# Patient Record
Sex: Female | Born: 1975 | Race: Black or African American | Hispanic: No | Marital: Single | Smoking: Never smoker
Health system: Southern US, Community
[De-identification: ages and names within clinical notes are randomized; demographics above are authoritative.]

## PROBLEM LIST (undated history)

## (undated) DIAGNOSIS — F329 Major depressive disorder, single episode, unspecified: Secondary | ICD-10-CM

## (undated) DIAGNOSIS — G8929 Other chronic pain: Secondary | ICD-10-CM

## (undated) DIAGNOSIS — E669 Obesity, unspecified: Secondary | ICD-10-CM

## (undated) DIAGNOSIS — F909 Attention-deficit hyperactivity disorder, unspecified type: Secondary | ICD-10-CM

## (undated) DIAGNOSIS — F419 Anxiety disorder, unspecified: Secondary | ICD-10-CM

## (undated) DIAGNOSIS — F32A Depression, unspecified: Secondary | ICD-10-CM

## (undated) DIAGNOSIS — G43909 Migraine, unspecified, not intractable, without status migrainosus: Secondary | ICD-10-CM

## (undated) DIAGNOSIS — F319 Bipolar disorder, unspecified: Secondary | ICD-10-CM

## (undated) DIAGNOSIS — S82843A Displaced bimalleolar fracture of unspecified lower leg, initial encounter for closed fracture: Secondary | ICD-10-CM

## (undated) DIAGNOSIS — M2550 Pain in unspecified joint: Secondary | ICD-10-CM

## (undated) DIAGNOSIS — R7303 Prediabetes: Secondary | ICD-10-CM

## (undated) DIAGNOSIS — D649 Anemia, unspecified: Secondary | ICD-10-CM

## (undated) HISTORY — DX: Depression, unspecified: F32.A

## (undated) HISTORY — DX: Obesity, unspecified: E66.9

## (undated) HISTORY — PX: WISDOM TOOTH EXTRACTION: SHX21

## (undated) HISTORY — DX: Migraine, unspecified, not intractable, without status migrainosus: G43.909

## (undated) HISTORY — DX: Major depressive disorder, single episode, unspecified: F32.9

## (undated) HISTORY — DX: Anemia, unspecified: D64.9

---

## 1997-10-29 ENCOUNTER — Emergency Department (HOSPITAL_COMMUNITY): Admission: EM | Admit: 1997-10-29 | Discharge: 1997-10-29 | Payer: Self-pay | Admitting: Emergency Medicine

## 1997-10-31 ENCOUNTER — Emergency Department (HOSPITAL_COMMUNITY): Admission: EM | Admit: 1997-10-31 | Discharge: 1997-10-31 | Payer: Self-pay | Admitting: Emergency Medicine

## 1998-07-25 ENCOUNTER — Other Ambulatory Visit: Admission: RE | Admit: 1998-07-25 | Discharge: 1998-07-25 | Payer: Self-pay | Admitting: Obstetrics

## 1998-07-25 ENCOUNTER — Ambulatory Visit (HOSPITAL_COMMUNITY): Admission: AD | Admit: 1998-07-25 | Discharge: 1998-07-25 | Payer: Self-pay | Admitting: Obstetrics

## 1999-02-13 ENCOUNTER — Emergency Department (HOSPITAL_COMMUNITY): Admission: EM | Admit: 1999-02-13 | Discharge: 1999-02-13 | Payer: Self-pay | Admitting: Emergency Medicine

## 1999-10-10 ENCOUNTER — Encounter: Payer: Self-pay | Admitting: Emergency Medicine

## 1999-10-10 ENCOUNTER — Emergency Department (HOSPITAL_COMMUNITY): Admission: EM | Admit: 1999-10-10 | Discharge: 1999-10-10 | Payer: Self-pay | Admitting: Emergency Medicine

## 1999-10-25 ENCOUNTER — Emergency Department (HOSPITAL_COMMUNITY): Admission: EM | Admit: 1999-10-25 | Discharge: 1999-10-25 | Payer: Self-pay

## 2000-03-05 ENCOUNTER — Other Ambulatory Visit: Admission: RE | Admit: 2000-03-05 | Discharge: 2000-03-05 | Payer: Self-pay | Admitting: Obstetrics

## 2000-05-10 ENCOUNTER — Emergency Department (HOSPITAL_COMMUNITY): Admission: EM | Admit: 2000-05-10 | Discharge: 2000-05-10 | Payer: Self-pay | Admitting: Emergency Medicine

## 2000-05-24 ENCOUNTER — Inpatient Hospital Stay (HOSPITAL_COMMUNITY): Admission: AD | Admit: 2000-05-24 | Discharge: 2000-05-24 | Payer: Self-pay | Admitting: Obstetrics

## 2000-06-08 ENCOUNTER — Encounter: Payer: Self-pay | Admitting: Emergency Medicine

## 2000-06-08 ENCOUNTER — Emergency Department (HOSPITAL_COMMUNITY): Admission: EM | Admit: 2000-06-08 | Discharge: 2000-06-09 | Payer: Self-pay | Admitting: Emergency Medicine

## 2000-07-01 ENCOUNTER — Emergency Department (HOSPITAL_COMMUNITY): Admission: EM | Admit: 2000-07-01 | Discharge: 2000-07-01 | Payer: Self-pay

## 2000-07-31 ENCOUNTER — Inpatient Hospital Stay (HOSPITAL_COMMUNITY): Admission: AD | Admit: 2000-07-31 | Discharge: 2000-07-31 | Payer: Self-pay | Admitting: Obstetrics

## 2000-09-01 ENCOUNTER — Inpatient Hospital Stay (HOSPITAL_COMMUNITY): Admission: AD | Admit: 2000-09-01 | Discharge: 2000-09-01 | Payer: Self-pay | Admitting: Obstetrics

## 2000-09-15 ENCOUNTER — Inpatient Hospital Stay (HOSPITAL_COMMUNITY): Admission: AD | Admit: 2000-09-15 | Discharge: 2000-09-18 | Payer: Self-pay | Admitting: Obstetrics

## 2000-09-20 ENCOUNTER — Encounter: Payer: Self-pay | Admitting: Emergency Medicine

## 2000-09-20 ENCOUNTER — Emergency Department (HOSPITAL_COMMUNITY): Admission: EM | Admit: 2000-09-20 | Discharge: 2000-09-20 | Payer: Self-pay | Admitting: Emergency Medicine

## 2001-10-08 ENCOUNTER — Emergency Department (HOSPITAL_COMMUNITY): Admission: EM | Admit: 2001-10-08 | Discharge: 2001-10-08 | Payer: Self-pay | Admitting: Emergency Medicine

## 2001-10-12 ENCOUNTER — Emergency Department (HOSPITAL_COMMUNITY): Admission: EM | Admit: 2001-10-12 | Discharge: 2001-10-12 | Payer: Self-pay

## 2001-12-03 ENCOUNTER — Emergency Department (HOSPITAL_COMMUNITY): Admission: EM | Admit: 2001-12-03 | Discharge: 2001-12-03 | Payer: Self-pay | Admitting: Emergency Medicine

## 2001-12-03 ENCOUNTER — Encounter: Payer: Self-pay | Admitting: Emergency Medicine

## 2002-02-06 ENCOUNTER — Emergency Department (HOSPITAL_COMMUNITY): Admission: EM | Admit: 2002-02-06 | Discharge: 2002-02-06 | Payer: Self-pay | Admitting: Emergency Medicine

## 2002-05-11 ENCOUNTER — Emergency Department (HOSPITAL_COMMUNITY): Admission: EM | Admit: 2002-05-11 | Discharge: 2002-05-11 | Payer: Self-pay | Admitting: Emergency Medicine

## 2002-05-11 ENCOUNTER — Encounter: Payer: Self-pay | Admitting: Emergency Medicine

## 2002-05-14 ENCOUNTER — Encounter: Payer: Self-pay | Admitting: Emergency Medicine

## 2002-05-14 ENCOUNTER — Emergency Department (HOSPITAL_COMMUNITY): Admission: EM | Admit: 2002-05-14 | Discharge: 2002-05-14 | Payer: Self-pay | Admitting: Emergency Medicine

## 2002-12-14 ENCOUNTER — Encounter: Payer: Self-pay | Admitting: Obstetrics

## 2002-12-14 ENCOUNTER — Inpatient Hospital Stay (HOSPITAL_COMMUNITY): Admission: AD | Admit: 2002-12-14 | Discharge: 2002-12-14 | Payer: Self-pay | Admitting: Obstetrics

## 2003-02-11 ENCOUNTER — Emergency Department (HOSPITAL_COMMUNITY): Admission: EM | Admit: 2003-02-11 | Discharge: 2003-02-11 | Payer: Self-pay | Admitting: Emergency Medicine

## 2003-06-28 ENCOUNTER — Emergency Department (HOSPITAL_COMMUNITY): Admission: EM | Admit: 2003-06-28 | Discharge: 2003-06-28 | Payer: Self-pay | Admitting: Emergency Medicine

## 2003-07-10 ENCOUNTER — Inpatient Hospital Stay (HOSPITAL_COMMUNITY): Admission: AD | Admit: 2003-07-10 | Discharge: 2003-07-13 | Payer: Self-pay | Admitting: Obstetrics

## 2003-07-12 ENCOUNTER — Encounter (INDEPENDENT_AMBULATORY_CARE_PROVIDER_SITE_OTHER): Payer: Self-pay | Admitting: Specialist

## 2003-07-12 HISTORY — PX: TUBAL LIGATION: SHX77

## 2003-11-04 ENCOUNTER — Emergency Department (HOSPITAL_COMMUNITY): Admission: EM | Admit: 2003-11-04 | Discharge: 2003-11-04 | Payer: Self-pay | Admitting: Emergency Medicine

## 2003-11-08 ENCOUNTER — Emergency Department (HOSPITAL_COMMUNITY): Admission: EM | Admit: 2003-11-08 | Discharge: 2003-11-08 | Payer: Self-pay | Admitting: Emergency Medicine

## 2005-10-04 ENCOUNTER — Emergency Department (HOSPITAL_COMMUNITY): Admission: EM | Admit: 2005-10-04 | Discharge: 2005-10-04 | Payer: Self-pay | Admitting: Emergency Medicine

## 2005-11-05 ENCOUNTER — Encounter: Admission: RE | Admit: 2005-11-05 | Discharge: 2005-11-05 | Payer: Self-pay | Admitting: Internal Medicine

## 2006-01-11 ENCOUNTER — Emergency Department (HOSPITAL_COMMUNITY): Admission: EM | Admit: 2006-01-11 | Discharge: 2006-01-11 | Payer: Self-pay | Admitting: Emergency Medicine

## 2006-01-16 ENCOUNTER — Emergency Department (HOSPITAL_COMMUNITY): Admission: EM | Admit: 2006-01-16 | Discharge: 2006-01-17 | Payer: Self-pay | Admitting: Emergency Medicine

## 2006-04-10 ENCOUNTER — Emergency Department (HOSPITAL_COMMUNITY): Admission: EM | Admit: 2006-04-10 | Discharge: 2006-04-10 | Payer: Self-pay | Admitting: Emergency Medicine

## 2006-05-22 ENCOUNTER — Emergency Department (HOSPITAL_COMMUNITY): Admission: EM | Admit: 2006-05-22 | Discharge: 2006-05-22 | Payer: Self-pay | Admitting: Emergency Medicine

## 2006-08-14 ENCOUNTER — Emergency Department (HOSPITAL_COMMUNITY): Admission: EM | Admit: 2006-08-14 | Discharge: 2006-08-14 | Payer: Self-pay | Admitting: Emergency Medicine

## 2007-01-25 ENCOUNTER — Emergency Department (HOSPITAL_COMMUNITY): Admission: EM | Admit: 2007-01-25 | Discharge: 2007-01-25 | Payer: Self-pay | Admitting: *Deleted

## 2007-02-01 ENCOUNTER — Emergency Department (HOSPITAL_COMMUNITY): Admission: EM | Admit: 2007-02-01 | Discharge: 2007-02-01 | Payer: Self-pay | Admitting: Emergency Medicine

## 2007-02-02 ENCOUNTER — Emergency Department (HOSPITAL_COMMUNITY): Admission: EM | Admit: 2007-02-02 | Discharge: 2007-02-02 | Payer: Self-pay | Admitting: Emergency Medicine

## 2007-02-12 ENCOUNTER — Emergency Department (HOSPITAL_COMMUNITY): Admission: EM | Admit: 2007-02-12 | Discharge: 2007-02-12 | Payer: Self-pay | Admitting: Emergency Medicine

## 2007-02-13 ENCOUNTER — Inpatient Hospital Stay (HOSPITAL_COMMUNITY): Admission: EM | Admit: 2007-02-13 | Discharge: 2007-02-19 | Payer: Self-pay | Admitting: Emergency Medicine

## 2007-02-19 ENCOUNTER — Ambulatory Visit: Payer: Self-pay | Admitting: Infectious Disease

## 2007-04-01 ENCOUNTER — Encounter: Admission: RE | Admit: 2007-04-01 | Discharge: 2007-04-01 | Payer: Self-pay | Admitting: Neurology

## 2007-12-19 ENCOUNTER — Emergency Department (HOSPITAL_COMMUNITY): Admission: EM | Admit: 2007-12-19 | Discharge: 2007-12-19 | Payer: Self-pay | Admitting: Emergency Medicine

## 2008-03-20 ENCOUNTER — Emergency Department (HOSPITAL_COMMUNITY): Admission: EM | Admit: 2008-03-20 | Discharge: 2008-03-20 | Payer: Self-pay | Admitting: Emergency Medicine

## 2008-03-29 ENCOUNTER — Encounter: Admission: RE | Admit: 2008-03-29 | Discharge: 2008-04-10 | Payer: Self-pay | Admitting: Obstetrics

## 2008-05-03 ENCOUNTER — Emergency Department (HOSPITAL_COMMUNITY): Admission: EM | Admit: 2008-05-03 | Discharge: 2008-05-03 | Payer: Self-pay | Admitting: Emergency Medicine

## 2008-05-03 ENCOUNTER — Emergency Department (HOSPITAL_COMMUNITY): Admission: EM | Admit: 2008-05-03 | Discharge: 2008-05-04 | Payer: Self-pay | Admitting: Emergency Medicine

## 2008-05-10 ENCOUNTER — Encounter: Admission: RE | Admit: 2008-05-10 | Discharge: 2008-06-22 | Payer: Self-pay | Admitting: Family Medicine

## 2008-08-17 ENCOUNTER — Emergency Department (HOSPITAL_COMMUNITY): Admission: EM | Admit: 2008-08-17 | Discharge: 2008-08-18 | Payer: Self-pay | Admitting: Emergency Medicine

## 2008-09-11 ENCOUNTER — Emergency Department (HOSPITAL_COMMUNITY): Admission: EM | Admit: 2008-09-11 | Discharge: 2008-09-11 | Payer: Self-pay | Admitting: Emergency Medicine

## 2009-05-23 ENCOUNTER — Emergency Department (HOSPITAL_COMMUNITY): Admission: EM | Admit: 2009-05-23 | Discharge: 2009-05-24 | Payer: Self-pay | Admitting: Emergency Medicine

## 2009-07-04 ENCOUNTER — Emergency Department (HOSPITAL_COMMUNITY): Admission: EM | Admit: 2009-07-04 | Discharge: 2009-07-05 | Payer: Self-pay | Admitting: Emergency Medicine

## 2009-08-21 ENCOUNTER — Emergency Department (HOSPITAL_COMMUNITY): Admission: EM | Admit: 2009-08-21 | Discharge: 2009-08-22 | Payer: Self-pay | Admitting: Emergency Medicine

## 2009-12-26 ENCOUNTER — Emergency Department (HOSPITAL_COMMUNITY): Admission: EM | Admit: 2009-12-26 | Discharge: 2009-12-26 | Payer: Self-pay | Admitting: Family Medicine

## 2010-03-02 ENCOUNTER — Emergency Department (HOSPITAL_COMMUNITY): Admission: EM | Admit: 2010-03-02 | Discharge: 2010-03-02 | Payer: Self-pay | Admitting: Emergency Medicine

## 2010-03-15 ENCOUNTER — Ambulatory Visit: Payer: Self-pay | Admitting: Vascular Surgery

## 2010-03-27 ENCOUNTER — Ambulatory Visit: Payer: Self-pay | Admitting: Family Medicine

## 2010-03-27 ENCOUNTER — Encounter: Payer: Self-pay | Admitting: Family Medicine

## 2010-03-27 DIAGNOSIS — G894 Chronic pain syndrome: Secondary | ICD-10-CM | POA: Insufficient documentation

## 2010-03-27 DIAGNOSIS — R5381 Other malaise: Secondary | ICD-10-CM | POA: Insufficient documentation

## 2010-03-27 DIAGNOSIS — E669 Obesity, unspecified: Secondary | ICD-10-CM

## 2010-03-27 DIAGNOSIS — F320 Major depressive disorder, single episode, mild: Secondary | ICD-10-CM | POA: Insufficient documentation

## 2010-03-27 DIAGNOSIS — R5383 Other fatigue: Secondary | ICD-10-CM

## 2010-03-27 DIAGNOSIS — M999 Biomechanical lesion, unspecified: Secondary | ICD-10-CM | POA: Insufficient documentation

## 2010-03-27 DIAGNOSIS — R51 Headache: Secondary | ICD-10-CM | POA: Insufficient documentation

## 2010-03-27 DIAGNOSIS — R519 Headache, unspecified: Secondary | ICD-10-CM | POA: Insufficient documentation

## 2010-04-01 LAB — CONVERTED CEMR LAB
Anti Nuclear Antibody(ANA): POSITIVE — AB
BUN: 9 mg/dL (ref 6–23)
CO2: 23 meq/L (ref 19–32)
Calcium: 8.8 mg/dL (ref 8.4–10.5)
Chloride: 106 meq/L (ref 96–112)
Creatinine, Ser: 0.52 mg/dL (ref 0.40–1.20)
Direct LDL: 119 mg/dL — ABNORMAL HIGH
Glucose, Bld: 115 mg/dL — ABNORMAL HIGH (ref 70–99)
Hemoglobin: 8.1 g/dL — ABNORMAL LOW (ref 12.0–15.0)
MCHC: 27.5 g/dL — ABNORMAL LOW (ref 30.0–36.0)
Platelets: 251 10*3/uL (ref 150–400)
RDW: 21.3 % — ABNORMAL HIGH (ref 11.5–15.5)
Rhuematoid fact SerPl-aCnc: 26 intl units/mL — ABNORMAL HIGH (ref 0–20)
TSH: 1.335 microintl units/mL (ref 0.350–4.500)

## 2010-04-10 ENCOUNTER — Ambulatory Visit: Payer: Self-pay | Admitting: Family Medicine

## 2010-05-03 ENCOUNTER — Ambulatory Visit: Payer: Self-pay | Admitting: Family Medicine

## 2010-05-14 ENCOUNTER — Telehealth: Payer: Self-pay | Admitting: Family Medicine

## 2010-05-15 ENCOUNTER — Telehealth: Payer: Self-pay | Admitting: Family Medicine

## 2010-05-21 ENCOUNTER — Emergency Department (HOSPITAL_COMMUNITY)
Admission: EM | Admit: 2010-05-21 | Discharge: 2010-05-21 | Payer: Self-pay | Source: Home / Self Care | Admitting: Emergency Medicine

## 2010-06-16 ENCOUNTER — Encounter: Payer: Self-pay | Admitting: Obstetrics

## 2010-06-25 NOTE — Assessment & Plan Note (Signed)
Summary: NP,tcb   Vital Signs:  Patient profile:   35 year old female Height:      67 inches Weight:      311 pounds BMI:     48.89 Temp:     98.1 degrees F oral Pulse rate:   96 / minute BP sitting:   119 / 73  (left arm) Cuff size:   large  Vitals Entered By: Garen Grams LPN (March 27, 2010 8:43 AM) CC: New Patient Is Patient Diabetic? No Pain Assessment Patient in pain? yes     Location: "all over"   Primary Care Provider:  Antoine Primas DO  CC:  New Patient.  History of Present Illness: Pt is here to establish care   1.  Pt states feelings of hoplesness, tearfulness, not enjoying things they used to, being more detached from family and friend, insomnia, trouble focusing, and overall felling of fatigue Denies SI, HI  Going to therapy: yes but only has had two visits so far.   2.  Headaches-  Pt is already being seen at the headache clinic but is having trouble still.  feels the medicine she has been given has not helped and that the flexaril makes her sleepy and the tompmax makes her feel like she cannot speak or think straight.  Headache is all over happens everyday no aura does not cause vision problems.   3.  Chonic pain.  Pt has pain everywhere does not seem to go away much, pt has tried flexaril but does not take it because it makes her sleepy.  has tried motrin with a little improvement willing to try many things if it will help.  Does not decapacitate her. Still is working.   4.  Anemia-  history of anemia, has not been checked in some time, pt used to take iron years ago and is having a lot of fatigue recently but no hair loss some weight gain maybe some LE swelling   5.  Obesity-  Pt knows she is overwiehgt finds it hard to workout would like to lose weight but wants to feel better first.      Habits & Providers  Alcohol-Tobacco-Diet     Tobacco Status: never  Current Medications (verified): 1)  Topamax 25 Mg Tabs (Topiramate) .... Take 1 Tab By  Mouth Three Times A Day 2)  Flexeril 5 Mg Tabs (Cyclobenzaprine Hcl) 3)  Citalopram Hydrobromide 20 Mg Tabs (Citalopram Hydrobromide) .... Take 1 Tab Daily For The Next Week Then 2 Tabs Daily Thereafter 4)  Tramadol Hcl 50 Mg Tabs (Tramadol Hcl) .... Take 1 Tab By Mouth Two Times A Day As Needed For Severe Pain  Allergies (verified): No Known Drug Allergies  Past History:  Past Medical History: Depression Anemia Migraines  Past Surgical History: BTL in 2005  Family History: Diabetes, stroke in 1st degree relatives Sister does have lupus.   Social History: lives with her 3 kids, does not smoke, does not drink no illicits. Smoking Status:  never  Review of Systems       denies fever, chills, nausea, vomiting, diarrhea or constipation shortness of breath chest pain  Physical Exam  General:  pleasant, does appear uncomfortable  Eyes:  PERRL, EOMI Ears:  TM visualized b/l non bulging, no retractions Nose:  clear Mouth:  MMM pink  Lungs:  CTAB Heart:  RRR no murmur Abdomen:  BS+, obese, NT Msk:  some trapezius tightness on right, full ROM of all extremities mild pain per pt.  no crepitus.    OMT findings T7 RS right.  Pulses:  2+ Extremities:  no Le edema noted.  Neurologic:  alert & oriented X3, cranial nerves II-XII intact, and strength normal in all extremities.   Skin:  no rash or suspicious lesions.    Impression & Recommendations:  Problem # 1:  DEPRESSION, MAJOR, MILD (ICD-296.21) pt denies suicidal ideation or Homicidal ideation , given plans if this did occur.  pt will start celexa hoping it will help with her chronic pain as well and possibly her headache. will give trial of 2-3 weeks and will have pt follow up id still having problem or side effect would consider effexor.   Problem # 2:  FATIGUE (ICD-780.79) wil test for the common things first as well as do to the generalized pain and diagnosis of SLe in 1st degree relative will check for lupus and RF.     Orders: Basic Met-[facility] [phone]) TSH-[facility] [phone]) CBC-[facility] (86578) ANA-[facility] ([id number]) Rheum Fact-[facility] (13244)  Problem # 3:  HEADACHE (ICD-784.0) Think it may be related to her depression and hope treatment will help.  Told pt to talk over treatment with her other provider before stopping medicine but if you do not think it is helping then would discontinue it.  Do not feel imaging is warranted.  *WOuld entertain the thought at next visit of possible sleep apnea due to pt size and fatigue as well givng her a headache.  Will discuss at next appointment.* Will give tramadol, at low dose do not think serotonin syndrome will occur.  Warned of side effects and to try it at home first but should be less sedating then flexeril. Will see again in 2 weeks.  Her updated medication list for this problem includes:    Tramadol Hcl 50 Mg Tabs (Tramadol hcl) .Marland Kitchen... Take 1 tab by mouth two times a day as needed for severe pain  Problem # 4:  OBESITY, UNSPECIFIED (ICD-278.00) will address more once depression is undercontrol, could be the root though of multiple problems.  Wil get LDL for risk stratification.  Orders: Direct LDL-[facility] ([id number])  Complete Medication List: 1)  Topamax 25 Mg Tabs (Topiramate) .... Take 1 tab by mouth three times a day 2)  Flexeril 5 Mg Tabs (Cyclobenzaprine hcl) 3)  Citalopram Hydrobromide 20 Mg Tabs (Citalopram hydrobromide) .... Take 1 tab daily for the next week then 2 tabs daily thereafter 4)  Tramadol Hcl 50 Mg Tabs (Tramadol hcl) .... Take 1 tab by mouth two times a day as needed for severe pain  Other Orders: OMT 1-2 [facility] [id number])  Patient Instructions: 1)  Nice to meet you 2)  I will get some labs today.  I will call you with the results 3)  I am starting you on a medication called celxa take 1 tab by mouth daily for the first week then 2 tabs by mouth dialy thereafter 4)  I am giving you a medication called tramadol for pain, it may make you  sleepy but should be better then flexaril.  STOP the flexeril.  5)  I want you to come see me in 2 weeks.  Prescriptions: TRAMADOL HCL 50 MG TABS (TRAMADOL HCL) take 1 tab by mouth two times a day as needed for severe pain  #60 x 0   Entered and Authorized by:   Antoine Primas DO   Signed by:   Antoine Primas DO on 03/27/2010   Method used:   [facility] to  [facility] [address] [phone]* (retail)       [address]       [place], [place]  [address]       Ph: [phone] or [phone]       Fax: [phone]   RxID:   [id number] CITALOPRAM HYDROBROMIDE 20 MG TABS (CITALOPRAM HYDROBROMIDE) take 1 tab daily for the next week then 2 tabs daily thereafter  #62 x 0   Entered and Authorized by:   Antoine Primas DO   Signed by:   Antoine Primas DO on 03/27/2010   Method used:   [facility]        [facility] [address] [phone]* (retail)       [address]       [place], [place]  [address]       Ph: [phone] or [phone]       Fax: [phone]   RxID:   [id number]    Orders Added: 1)  Basic Met-[facility] [phone] 2)  TSH-[facility] [[id number]] 3)  CBC-[facility] [85027] 4)  Direct LDL-[facility] [10175-10258] 5)  [facility] [99204] 6)  OMT 1-2 [facility] [98925] 7)  ANA-[facility] [52778-24235] 8)  Rheum Fact-[facility] [36144]

## 2010-06-25 NOTE — Assessment & Plan Note (Signed)
Summary: F/U  [name]   Vital Signs:  Patient profile:   35 year old female Height:      67 inches Weight:      311 pounds BMI:     48.89 Temp:     99.0 degrees F Pulse rate:   92 / minute BP sitting:   114 / 72  (left arm)  Vitals Entered By: Rochele Pages RN (May 03, 2010 9:14 AM) CC: f/u   Primary Care Provider:  Antoine Primas DO  CC:  f/u.  History of Present Illness: Pt is here for follow up  1.  Depression -  Pt was started on celexa and has been on it for three weeks.  has not noticed an improvement at all at this moment.  Pt states feelings of hoplesness, tearfulness, not enjoying things they used to, being more detached from family and friend, insomnia, trouble focusing, and overall felling of fatigue.  TSH was normal  Denies SI, Homicidal ideation  PHQ 9 shows only mild symptoms.  Going to therapy: yes missed last one though  2.  Headaches-  Pt states she has been waking up with them still going to headache clinic states no improvement.  Pt states she does snore at night but does not think she stops breathing.   3.  Chonic pain.  Pt has been on vicodin with minimal improvement.  Pt states without it she can not really get out of bed well. Pt has no side effect to medicination.  Pt had ANA + but titer extremely low.  4.  Anemia-  Hgb 8.1, is now taking iron two times a day.  Some fatigue and does get tired with long walks.         Habits & Providers  Alcohol-Tobacco-Diet     Tobacco Status: never  Current Medications (verified): 1)  Flexeril 5 Mg Tabs (Cyclobenzaprine Hcl) .... Take 1 Tab By Mouth Three Times A Day As Needed For Muscle Spasm 2)  Citalopram Hydrobromide 20 Mg Tabs (Citalopram Hydrobromide) .... Take 1 Tab Daily For The Next Week Then 2 Tabs Daily Thereafter 3)  Vicodin 5-500 Mg Tabs (Hydrocodone-Acetaminophen) .Marland Kitchen.. 1 Tab By Mouth Three Times A Day As Needed 4)  Iron Supplement 325 (65 Fe) Mg Tabs (Ferrous Sulfate) .Marland Kitchen.. 1 Tab Two Times A Day 5)   Topamax 50 Mg Tabs (Topiramate) .Marland Kitchen.. 1 Tab By Mouth Two Times A Day  Allergies (verified): No Known Drug Allergies  Past History:  Past medical, surgical, family and social histories (including risk factors) reviewed, and no changes noted (except as noted below).  Past Medical History: Reviewed history from 03/27/2010 and no changes required. Depression Anemia Migraines  Past Surgical History: Reviewed history from 03/27/2010 and no changes required. BTL in 2005  Family History: Reviewed history from 03/27/2010 and no changes required. Diabetes, stroke in 1st degree relatives Sister does have lupus.   Social History: Reviewed history from 03/27/2010 and no changes required. lives with her 3 kids, does not smoke, does not drink no illicits.   Review of Systems       denies fever, chills, nausea, vomiting, diarrhea or constipation   Physical Exam  General:  pleasant, does appear uncomfortable  Eyes:  PERRL, EOMI Ears:  TM visualized b/l non bulging, no retractions Nose:  clear Mouth:  MMM pink  Lungs:  CTAB Heart:  RRR no murmur Abdomen:  BS+, obese, NT Msk:  some trapezius tightness on right, full ROM of all extremities mild  pain per pt.  no crepitus.   5/5 strength in all extremities  OMT findings T7 RS right.  Pulses:  2+ Extremities:  no Le edema noted.  Neurologic:  alert & oriented X3, cranial nerves II-XII intact, and strength normal in all extremities.     Impression & Recommendations:  Problem # 1:  CHRONIC PAIN SYNDROME (ICD-338.4) will give another refill of vicodin for now, would like to get off narcotics at some time but tramadol did not help. Orders: [facility]- Est  Level 4 (24401)  Problem # 2:  DEPRESSION, MAJOR, MILD (ICD-296.21) contineu celexa for now, told her we will readdress at next visit if not working will try effexor to see if will help with energy and for weight loss but don't think main problem.  Would love for pt to have a sleep study  but pt has declined.   Orders: [facility]- Est  Level 4 (02725)  Problem # 3:  OBESITY, UNSPECIFIED (ICD-278.00) not ready to lose weight at this time.  Orders: [facility]- Est  Level 4 (36644)  Problem # 4:  HEADACHE (ICD-784.0) Think it may be due to underlying OSA, but pt declines sleep study at this time.  The following medications were removed from the medication list:    Tramadol Hcl 50 Mg Tabs (Tramadol hcl) .Marland Kitchen... Take 1 tab by mouth two times a day as needed for severe pain Her updated medication list for this problem includes:    Vicodin 5-500 Mg Tabs (Hydrocodone-acetaminophen) .Marland Kitchen... 1 tab by mouth three times a day as needed  Complete Medication List: 1)  Flexeril 5 Mg Tabs (Cyclobenzaprine hcl) .... Take 1 tab by mouth three times a day as needed for muscle spasm 2)  Citalopram Hydrobromide 20 Mg Tabs (Citalopram hydrobromide) .... Take 1 tab daily for the next week then 2 tabs daily thereafter 3)  Vicodin 5-500 Mg Tabs (Hydrocodone-acetaminophen) .Marland Kitchen.. 1 tab by mouth three times a day as needed 4)  Iron Supplement 325 (65 Fe) Mg Tabs (Ferrous sulfate) .Marland Kitchen.. 1 tab two times a day 5)  Topamax 50 Mg Tabs (Topiramate) .Marland Kitchen.. 1 tab by mouth two times a day  Patient Instructions: 1)  Lets try the celexa for another 3-4 weeks. 2)  I will give you another presscription for the vicodin 3)  I need to see you again in 1 month.  Prescriptions: FLEXERIL 5 MG TABS (CYCLOBENZAPRINE HCL) take 1 tab by mouth three times a day as needed for muscle spasm  #90 x 1   Entered and Authorized by:   Antoine Primas DO   Signed by:   Antoine Primas DO on 05/03/2010   Method used:   Electronically to        [facility] [address] [phone]* (retail)       [address]       [address]       [place], [place]  [address]       Ph: [phone]       Fax: [phone]   RxID:   [id number]    Orders Added: 1)  [facility]- Est  Level 4 [20254]

## 2010-06-25 NOTE — Assessment & Plan Note (Signed)
Summary: f/u eo   Vital Signs:  Patient profile:   35 year old female Height:      67 inches Weight:      309 pounds BMI:     48.57 Temp:     98.1 degrees F oral Pulse rate:   101 / minute BP sitting:   137 / 79  (left arm) Cuff size:   large  Vitals Entered By: Garen Grams LPN (April 10, 2010 9:56 AM) CC: f/u depression, pain, Headache Is Patient Diabetic? No Pain Assessment Patient in pain? yes     Location: " all over"   Primary Care Provider:  Antoine Primas DO  CC:  f/u depression, pain, and Headache.  History of Present Illness: Pt is here to establish care   1.  Depression -  Pt was seen last time and was supposed to start celexa but did not receive the medication.  Pt still willing to try.  Pt states feelings of hoplesness, tearfulness, not enjoying things they used to, being more detached from family and friend, insomnia, trouble focusing, and overall felling of fatigue.  TSH was normal  Denies SI, Homicidal ideation  PHQ 9 shows only mild symptoms.   Going to therapy: yes missed last one though  2.  Headaches-  Pt is already being seen at the headache clinic but is having trouble still.  Stopped topomax and feels a little better overall.   3.  Chonic pain.  Pt has pain everywhere does not seem to go away much, pt has tried flexaril but does not take it because it makes her sleepy.  Pt still able to do all ADL's but wants a handicap sticker. Pt had ANA + but titer extremely low.  4.  Anemia-  Hgb 8.1, is now taking iron two times a day.  Some fatigue and does get tired with long walks.    5.  Obesity-  Pt knows she is overwiehgt finds it hard to workout would like to lose weight but wants to feel better first.      Habits & Providers  Alcohol-Tobacco-Diet     Tobacco Status: never  Current Medications (verified): 1)  Topamax 25 Mg Tabs (Topiramate) .... Take 1 Tab By Mouth Three Times A Day 2)  Flexeril 5 Mg Tabs (Cyclobenzaprine Hcl) 3)  Citalopram  Hydrobromide 20 Mg Tabs (Citalopram Hydrobromide) .... Take 1 Tab Daily For The Next Week Then 2 Tabs Daily Thereafter 4)  Tramadol Hcl 50 Mg Tabs (Tramadol Hcl) .... Take 1 Tab By Mouth Two Times A Day As Needed For Severe Pain 5)  Vicodin 5-500 Mg Tabs (Hydrocodone-Acetaminophen) .Marland Kitchen.. 1 Tab By Mouth Three Times A Day As Needed 6)  Iron Supplement 325 (65 Fe) Mg Tabs (Ferrous Sulfate) .Marland Kitchen.. 1 Tab Two Times A Day  Allergies (verified): No Known Drug Allergies  Past History:  Past medical, surgical, family and social histories (including risk factors) reviewed, and no changes noted (except as noted below).  Past Medical History: Reviewed history from 03/27/2010 and no changes required. Depression Anemia Migraines  Past Surgical History: Reviewed history from 03/27/2010 and no changes required. BTL in 2005  Family History: Reviewed history from 03/27/2010 and no changes required. Diabetes, stroke in 1st degree relatives Sister does have lupus.   Social History: Reviewed history from 03/27/2010 and no changes required. lives with her 3 kids, does not smoke, does not drink no illicits.   Review of Systems       see hpi  Impression & Recommendations:  Problem # 1:  DEPRESSION, MAJOR, MILD (ICD-296.21) will start celexa agin told her to watch for side effects, see me again in 3 weeks. Gave red flags when to seek medical attention.  Orders: [facility]- Est  Level 4 (04540)  Problem # 2:  CHRONIC PAIN SYNDROME (ICD-338.4) celexa likely to help, did give her some vicodin due to tramadol not helping and will see again in 3 weeks.  Do not want to do narcotics for long time in pt.  Pt already attempted to get handicap sticker whcih I politley refused to give her.   Pt stated that I was not a good Dr. due to not giving her the handicap sticker.  Told her my job is to make sure she gets better and there are other doctors for that.  Orders: [facility]- Est  Level 4 (98119)  Problem # 3:  HEADACHE  (ICD-784.0) likely tied to the chronic pain syndrome, could be due to the anemia.  Will contineu to do same iron two times a day and celexa.  Pt is seen at the headache clinic.  Her updated medication list for this problem includes:    Tramadol Hcl 50 Mg Tabs (Tramadol hcl) .Marland Kitchen... Take 1 tab by mouth two times a day as needed for severe pain    Vicodin 5-500 Mg Tabs (Hydrocodone-acetaminophen) .Marland Kitchen... 1 tab by mouth three times a day as needed  Orders: [facility]- Est  Level 4 (14782)  Complete Medication List: 1)  Topamax 25 Mg Tabs (Topiramate) .... Take 1 tab by mouth three times a day 2)  Flexeril 5 Mg Tabs (Cyclobenzaprine hcl) 3)  Citalopram Hydrobromide 20 Mg Tabs (Citalopram hydrobromide) .... Take 1 tab daily for the next week then 2 tabs daily thereafter 4)  Tramadol Hcl 50 Mg Tabs (Tramadol hcl) .... Take 1 tab by mouth two times a day as needed for severe pain 5)  Vicodin 5-500 Mg Tabs (Hydrocodone-acetaminophen) .Marland Kitchen.. 1 tab by mouth three times a day as needed 6)  Iron Supplement 325 (65 Fe) Mg Tabs (Ferrous sulfate) .Marland Kitchen.. 1 tab two times a day Prescriptions: IRON SUPPLEMENT 325 (65 FE) MG TABS (FERROUS SULFATE) 1 tab two times a day  #62 x 0   Entered and Authorized by:   Antoine Primas DO   Signed by:   Antoine Primas DO on 04/10/2010   Method used:   Handwritten   RxID:   [id number] VICODIN 5-500 MG TABS (HYDROCODONE-ACETAMINOPHEN) 1 tab by mouth three times a day as needed  #90 x 0   Entered and Authorized by:   Antoine Primas DO   Signed by:   Antoine Primas DO on 04/10/2010   Method used:   Handwritten   RxID:   [id number]    Orders Added: 1)  [facility]- Est  Level 4 [25366]

## 2010-06-27 NOTE — Progress Notes (Signed)
  Phone Note Refill Request Call back at [phone]   Refills Requested: Medication #1:  FLEXERIL 5 MG TABS take 1 tab by mouth three times a day as needed for muscle spasm  Medication #2:  CITALOPRAM HYDROBROMIDE 20 MG TABS take 1 tab daily for the next week then 2 tabs daily thereafter  Medication #3:  CITALOPRAM HYDROBROMIDE 20 MG TABS take 1 tab daily for the next week then 2 tabs daily thereafter  Medication #4:  IRON SUPPLEMENT 325 (65 FE) MG TABS 1 tab two times a day Ms. Bundrick calling to request new rxs for meds that  were accidentally discarded by one of her children while moving from one residence to another.  Please call her when ready.  Initial call taken by: Abundio Miu,  May 14, 2010 10:41 AM    Prescriptions: IRON SUPPLEMENT 325 (65 FE) MG TABS (FERROUS SULFATE) 1 tab two times a day  #62 x 0   Entered and Authorized by:   Antoine Primas DO   Signed by:   Antoine Primas DO on 05/14/2010   Method used:   Electronically to        [facility] [address] [phone]* (retail)       [address]       [address]       [place], [place]  [address]       Ph: [phone]       Fax: [phone]   RxID:   [id number] FLEXERIL 5 MG TABS (CYCLOBENZAPRINE HCL) take 1 tab by mouth three times a day as needed for muscle spasm  #90 x 1   Entered and Authorized by:   Antoine Primas DO   Signed by:   Antoine Primas DO on 05/14/2010   Method used:   Electronically to        [facility] [address] [phone]* (retail)       [address]       [address]       [place], [place]  [address]       Ph: [phone]       Fax: [phone]   RxID:   [id number] CITALOPRAM HYDROBROMIDE 20 MG TABS (CITALOPRAM HYDROBROMIDE) take 1 tab daily for the next week then 2 tabs daily thereafter  #62 x 3   Entered and Authorized by:   Antoine Primas DO   Signed by:   Antoine Primas DO on 05/14/2010   Method used:   Electronically to        [facility] [address] [phone]* (retail)  [address]       [address]       [place], [place]  [address]       Ph: [phone]       Fax: [phone]   RxID:   [id number]

## 2010-06-27 NOTE — Progress Notes (Signed)
  Phone Note Refill Request   Refills Requested: Medication #1:  VICODIN 5-500 MG TABS 1 tab by mouth three times a day as needed  Medication #2:  FLEXERIL 5 MG TABS take 1 tab by mouth three times a day as needed for muscle spasm Pt picked up rx but did not receive Flexerill and also needed the Vicodin.  Pharmacy requesting call to them regarding refill  Initial call taken by: Abundio Miu,  May 15, 2010 2:50 PM  Follow-up for Phone Call        called pt lost meds, and I do believe her. Called pharmacy and had them refill.  If this happens again pt will have to see me.  Follow-up by: Antoine Primas DO,  May 15, 2010 4:56 PM    Prescriptions: VICODIN 5-500 MG TABS (HYDROCODONE-ACETAMINOPHEN) 1 tab by mouth three times a day as needed  #60 x 0   Entered and Authorized by:   Antoine Primas DO   Signed by:   Antoine Primas DO on 05/15/2010   Method used:   Telephoned to ...       [facility] [address] [phone]* (retail)       [address]       [address]       [place], [place]  [address]       Ph: [phone]       Fax: [phone]   RxID:   [id number] FLEXERIL 5 MG TABS (CYCLOBENZAPRINE HCL) take 1 tab by mouth three times a day as needed for muscle spasm  #90 x 1   Entered and Authorized by:   Antoine Primas DO   Signed by:   Antoine Primas DO on 05/15/2010   Method used:   Electronically to        [facility] [address] [phone]* (retail)       [address]       [address]       [place], [place]  [address]       Ph: [phone]       Fax: [phone]   RxID:   [id number]

## 2010-06-28 ENCOUNTER — Encounter: Payer: Self-pay | Admitting: *Deleted

## 2010-07-03 ENCOUNTER — Encounter: Payer: Self-pay | Admitting: Family Medicine

## 2010-07-04 ENCOUNTER — Encounter: Payer: Self-pay | Admitting: Family Medicine

## 2010-07-04 ENCOUNTER — Ambulatory Visit (INDEPENDENT_AMBULATORY_CARE_PROVIDER_SITE_OTHER): Payer: Medicare Other | Admitting: Family Medicine

## 2010-07-04 VITALS — BP 103/73 | HR 99 | Temp 99.1°F | Ht 66.0 in | Wt 313.5 lb

## 2010-07-04 DIAGNOSIS — E669 Obesity, unspecified: Secondary | ICD-10-CM

## 2010-07-04 DIAGNOSIS — F320 Major depressive disorder, single episode, mild: Secondary | ICD-10-CM

## 2010-07-04 DIAGNOSIS — M999 Biomechanical lesion, unspecified: Secondary | ICD-10-CM | POA: Insufficient documentation

## 2010-07-04 DIAGNOSIS — G894 Chronic pain syndrome: Secondary | ICD-10-CM

## 2010-07-04 MED ORDER — HYDROCODONE-ACETAMINOPHEN 5-500 MG PO TABS: 1.0000 | ORAL_TABLET | Freq: Three times a day (TID) | ORAL | Status: DC | PRN

## 2010-07-04 MED ORDER — VENLAFAXINE HCL ER 75 MG PO CP24: 75.0000 mg | ORAL_CAPSULE | Freq: Every day | ORAL | Status: DC

## 2010-07-04 NOTE — Patient Instructions (Signed)
Stop the Celexa Start Effexor taking 1 pill daily for the first week then 2 pills daily thereafter  I refilled your Vicodin to have on hand You can always come back for manipulation  I want to see you again in 1-2 months

## 2010-07-04 NOTE — Assessment & Plan Note (Signed)
Talked to pt at length again, husband is motivated to try to help but works night. Told likely the main problem with the chronic back pain. Pt declines nutrition consult at this time. Will see pt again in 1-2 months and see pt progress, will start walking.
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
## 2010-07-04 NOTE — Assessment & Plan Note (Signed)
As above and with manipulation.   Appeared to help considerably. Will try new medication and will see pt again in 3-4 weeks.

## 2010-07-04 NOTE — Assessment & Plan Note (Signed)
After consent given pt had HVLA on back done with considerable improvement. Will have pt return in 3-4 weeks for more manipulation if helping.

## 2010-07-04 NOTE — Assessment & Plan Note (Signed)
Pt appears to not be improving used SSRI to help with chronic pain but not helping will try Effexor and see if will help side effects may help as well with energy level and weight loss.  Will see pt again in 1 month. Gave instructions on how to increase slowly.  Stopped Celexa.

## 2010-07-04 NOTE — Assessment & Plan Note (Signed)
HVAL on side help after consent given, encouraged exercising and core stability training.  Pt decline PT at this time.  Encouraged weight loss as well given exercises to do again.

## 2010-08-05 LAB — URINE MICROSCOPIC-ADD ON

## 2010-08-05 LAB — URINALYSIS, ROUTINE W REFLEX MICROSCOPIC
Bilirubin Urine: NEGATIVE
Glucose, UA: NEGATIVE mg/dL
Ketones, ur: NEGATIVE mg/dL
Nitrite: POSITIVE — AB
Specific Gravity, Urine: 1.025 (ref 1.005–1.030)
pH: 6.5 (ref 5.0–8.0)

## 2010-08-05 LAB — URINE CULTURE: Culture  Setup Time: 201112272019

## 2010-08-07 LAB — POCT URINALYSIS DIPSTICK
Specific Gravity, Urine: 1.025 (ref 1.005–1.030)
pH: 6 (ref 5.0–8.0)

## 2010-08-07 LAB — POCT PREGNANCY, URINE: Preg Test, Ur: NEGATIVE

## 2010-08-09 LAB — POCT I-STAT, CHEM 8
BUN: 6 mg/dL (ref 6–23)
Chloride: 107 mEq/L (ref 96–112)
HCT: 32 % — ABNORMAL LOW (ref 36.0–46.0)
Potassium: 3.8 mEq/L (ref 3.5–5.1)

## 2010-08-20 ENCOUNTER — Encounter: Payer: Self-pay | Admitting: Family Medicine

## 2010-08-21 ENCOUNTER — Encounter: Payer: Self-pay | Admitting: Family Medicine

## 2010-08-21 ENCOUNTER — Ambulatory Visit (INDEPENDENT_AMBULATORY_CARE_PROVIDER_SITE_OTHER): Payer: Medicare Other | Admitting: Family Medicine

## 2010-08-21 DIAGNOSIS — R609 Edema, unspecified: Secondary | ICD-10-CM | POA: Insufficient documentation

## 2010-08-21 DIAGNOSIS — G894 Chronic pain syndrome: Secondary | ICD-10-CM

## 2010-08-21 DIAGNOSIS — E669 Obesity, unspecified: Secondary | ICD-10-CM

## 2010-08-21 DIAGNOSIS — F320 Major depressive disorder, single episode, mild: Secondary | ICD-10-CM

## 2010-08-21 MED ORDER — VENLAFAXINE HCL ER 75 MG PO CP24: 225.0000 mg | ORAL_CAPSULE | Freq: Every day | ORAL | Status: DC

## 2010-08-21 MED ORDER — FUROSEMIDE 20 MG PO TABS: 20.0000 mg | ORAL_TABLET | Freq: Every day | ORAL | Status: DC

## 2010-08-21 MED ORDER — HYDROCODONE-ACETAMINOPHEN 5-500 MG PO TABS: 1.0000 | ORAL_TABLET | Freq: Four times a day (QID) | ORAL | Status: DC | PRN
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
## 2010-08-21 NOTE — Assessment & Plan Note (Signed)
New finding no JVD, only think due to obesity.  Will give lasix small dose to try and have pt titrate as necessary will have pt back in 1 month and will check BMET at that time.

## 2010-08-21 NOTE — Assessment & Plan Note (Signed)
Pt is more motivated and likely dyue to the response from the antidepressant.  Pt has plan and looks ready to make a change,.

## 2010-08-21 NOTE — Patient Instructions (Signed)
Good to see you I am giving you a pill for your fluid.  Take 1 pill in AM. I am giving you a new prescription for your vicodin I am increasing your effexor to 3 pills daily for a total of 225 mg.  I need to see you again in 1 month to check your potassium level.

## 2010-08-21 NOTE — Assessment & Plan Note (Signed)
Improved.  Will increase effexor to 225 mg daily and will continue to do well hopefully will follow up in 1 month.

## 2010-09-13 ENCOUNTER — Telehealth: Payer: Self-pay | Admitting: Family Medicine

## 2010-09-13 NOTE — Telephone Encounter (Signed)
Please tell pt no.  I am sorry but she is not disabled. Thank you

## 2010-09-13 NOTE — Telephone Encounter (Signed)
LMOVM informing patient. 

## 2010-09-13 NOTE — Telephone Encounter (Signed)
Pt is requesting a handicap placard due to her chronic pain, says she has to park far away at different places she goes to.

## 2010-09-26 ENCOUNTER — Encounter: Payer: Self-pay | Admitting: Family Medicine

## 2010-09-26 ENCOUNTER — Ambulatory Visit (INDEPENDENT_AMBULATORY_CARE_PROVIDER_SITE_OTHER): Payer: Medicare Other | Admitting: Family Medicine

## 2010-09-26 DIAGNOSIS — E669 Obesity, unspecified: Secondary | ICD-10-CM

## 2010-09-26 DIAGNOSIS — G894 Chronic pain syndrome: Secondary | ICD-10-CM

## 2010-09-26 DIAGNOSIS — F320 Major depressive disorder, single episode, mild: Secondary | ICD-10-CM

## 2010-09-26 NOTE — Assessment & Plan Note (Signed)
Appears to be doing well has lost weight and gave lots of encouragement, gave stretching techniques to help after walks. Pt goal next month is 315.  Year goal is 225

## 2010-09-26 NOTE — Assessment & Plan Note (Signed)
Would definitely love to get pt off narcotics, will refill for now and start neurontin to help with neurogenic pain and chronic pain.  Hopefully weight loss will be the main goal.  Will see again in 1 month and hopefully decrease vicodin some.

## 2010-09-26 NOTE — Assessment & Plan Note (Signed)
Continue current treatment pt appears to be doing really well at moment.

## 2010-09-26 NOTE — Patient Instructions (Signed)
Good to see you I am proud of you and the weight loss, keep it up! Keep exercising and eating right I am giving you a new medication called neurontin.  Take 1 pill at night for next week then 2 pills nightly thereafter. I refilled your Vicodin I want to see you again in 1 month.

## 2010-09-26 NOTE — Progress Notes (Signed)
  Subjective:    Patient ID: [name], female    DOB: 1976/03/04, 35 y.o.   MRN: [id number]  HPI  1.  F/u on chronic pain-  Pt states she is still having the pain the vicodin does help still but feels she is starting to get a tolerance.  Pt though able to do more activities then she used to do. Pt does state she is having a better well being about her self  2.  Depression-  Pt has really enjoyed the effexor and states she feels like it has helped, it has also helped her relationship with her husband because they are able to be more active.  Denies  Suicidal and Homicidal ideation    3.  Obesity-  Pt has lost three pounds in the last month.  Pt has started to walk 3 times  A week for about thirty minutes and doing well, pt though states she has a lot of hip pain at the end of it usually a couple hours after walking.  Denies the hips or legs ever giving out on her feels like muscle soreness she has been walking like this for a week now.  Pt also has tried to cut out all caloric drinks and fried foods and doing a good job at this.    Review of Systems Denies fever, chills, nausea vomiting abdominal pain, dysuria, chest pain, shortness of breath dyspnea on exertion or numbness in extremities     Active Ambulatory Problems    Diagnosis Date Noted  . OBESITY, UNSPECIFIED 03/27/2010  . DEPRESSION, MAJOR, MILD 03/27/2010  . CHRONIC PAIN SYNDROME 03/27/2010  . NONALLOPATHIC LESION OF THORACIC REGION NEC 03/27/2010  . FATIGUE 03/27/2010  . HEADACHE 03/27/2010  . Nonallopathic lesion of lumbar region 07/04/2010  . Edema 08/21/2010   Resolved Ambulatory Problems    Diagnosis Date Noted  . No Resolved Ambulatory Problems   Past Medical History  Diagnosis Date  . Depression   . Back pain   . Obesity   . Migraines   . Anemia     Objective:   Physical Exam Gen: NAD obese CV RRR no mumur Pul: CTAB Abd:  BS + NT ND Ext:  Trace edema to ankle bilaterally tight psoas bilateral, NVI, no  crepitus at hip joint full passive ROM.        Assessment & Plan:

## 2010-10-08 NOTE — Discharge Summary (Signed)
[name], [name]                ACCOUNT NO.:  [id number]   MEDICAL RECORD NO.:  [id number]          PATIENT TYPE:  INP   LOCATION:  1537                         FACILITY:  [facility]   PHYSICIAN:  Altha Harm, MDDATE OF BIRTH:  September 20, 1975   DATE OF ADMISSION:  02/13/2007  DATE OF DISCHARGE:  02/19/2007                               DISCHARGE SUMMARY   DISCHARGE DISPOSITION:  Home.   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  2. Cephalgia.  3. Microcytic anemia.  4. Nausea and vomiting.   DISCHARGE MEDICATIONS:  1. Valtrex 1 g p.o. t.i.d. times ten days.  2. Fioricet one to two tabs p.o. q.4 hours p.r.n.   CONSULTANTS:  1. Dr. Sandria Manly, neurology.  2. Dr. Daiva Eves, infectious disease.   PROCEDURES:  Lumbar puncture.   DIAGNOSTIC STUDIES:  1. On September 20, patient had a CT of the head without contrast,      which was a negative noncontrast CT of the head.  2. On September 20, the patient had a chest x-ray, two-view, which      shows probable mild cardiomegaly.  Chest otherwise negative.  3. On September 20, the patient had a fluoroscopic guided lumbar      puncture with an opening pressure of 18 cm.  4. MRI of the brain with and without contrast, which showed negative      cranial MRI with and without contrast.   PERTINENT LABORATORY STUDIES:  Patient had herpes simplex DNA performed,  which showed HSV2 detected.  Quantitative HSV type 1, 2 antibodies IgG  CSF showed a level of 0.6 and HSV1 glycoprotein.  G antibody IgG showed  a level of 0.05.  The patient had a hepatitis viral titer, which was  negative.  Urine culture was performed and that was negative.  An  antinuclear antibody performed was also negative and blood cultures  performed on September 20 showed no growth, final result.   PRIMARY CARE PHYSICIAN:  Patient currently has no primary care  physician.   ALLERGIES:  No known drug allergies.   CHIEF COMPLAINT:  Intractable  headache.   HISTORY OF PRESENT ILLNESS:  Please see H and P, dictated by Karilyn Cota,  for details of the HPI.   In short, the patient was seen in the ER several times for intractable  headaches and then presented with photophobia.   HOSPITAL COURSE:  The patient had a lumbar puncture performed upon  arrival to the hospital, which showed a pleocytosis with white blood  cells of 335.  Given the patient's history, PCRs were sent off for  several different viruses.  Herpes type 2 came back positive.  The  patient had been started empirically on IV acyclovir.  This was  continued until her studies were all returned.  The patient was then  seen by Dr. Daiva Eves for infectious disease and counseled extensively  about herpes simplex type 2.  The patient is being discharged on Valtrex  1 g p.o. t.i.d. for an additional four days, to complete a ten-day  course of therapy, and also  Fioricet one to two tabs p.o. q. 4 hours for  pain.  The patient understands that the care for this is supportive care  and she should expect her headaches and nausea to get better as she  improves   DIETARY RESTRICTIONS:  None.   PHYSICAL RESTRICTIONS:  Activity as tolerated.      Altha Harm, MD  Electronically Signed     MAM/MEDQ  D:  02/19/2007  T:  02/20/2007  Job:  [id number]
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
## 2010-10-11 NOTE — Op Note (Signed)
NAMEKASHMIR, LEEDY                          ACCOUNT NO.:  [id number]   MEDICAL RECORD NO.:  [id number]                   PATIENT TYPE:  INP   LOCATION:  9133                                 FACILITY:  [facility]   PHYSICIAN:  Kathreen Cosier, M.D.           DATE OF BIRTH:  05/28/1975   DATE OF PROCEDURE:  DATE OF DISCHARGE:                                 OPERATIVE REPORT   PREOPERATIVE DIAGNOSIS:  Multiparity.   PROCEDURE:  Postpartum tubal ligation.   Using epidural, patient in the supine position, abdomen prepped and draped,  bladder emptied with straight catheter.  A midline subumbilical incision one  inch long was made, carried down to the fascia.  The fascia was cleaned and  grasped with two Kochers and the fascia and the peritoneum opened with the  Mayo scissors.  The left tube was grasped in the midportion with a Babcock  clamp, the tube traced to the fimbriae.  A 0 plain suture placed in the  mesosalpinx below the portion of the tube within the clamp.  This was tied,  approximately one inch of tube transected, hemostasis was satisfactory.  Procedure done in the exact fashion on the other side.  Abdomen closed in  layers, lap and sponge counts correct.  The peritoneum and fascia closed  with continuous suture of 0 Dexon, subcutaneous tissue closed with 3-0  plain, and the skin closed with subcuticular stitch of 4-0 Monocryl.  Blood  loss less than 5 mL.  The patient tolerated the procedure well, taken to the  recovery room in good condition.                                               Kathreen Cosier, M.D.    BAM/MEDQ  D:  07/12/2003  T:  07/12/2003  Job:  [id number]

## 2010-10-11 NOTE — Discharge Summary (Signed)
[name], [name]                          ACCOUNT NO.:  [id number]   MEDICAL RECORD NO.:  [id number]                   PATIENT TYPE:  INP   LOCATION:  9133                                 FACILITY:  [facility]   PHYSICIAN:  Kathreen Cosier, M.D.           DATE OF BIRTH:  Sep 29, 1975   DATE OF ADMISSION:  07/10/2003  DATE OF DISCHARGE:                                 DISCHARGE SUMMARY   HOSPITAL COURSE:  The patient is a 35 year old gravida 3 para 2-0-0-2; [facility]  July 09, 2003.  She was admitted for induction at term.  Negative GBS.  She was 2 cm, 70%, vertex, -2.  The patient had a normal vaginal delivery  and desired sterilization.  On admission her hemoglobin was 6.9, post  delivery 7.1.  Her tubal was postponed day #1 because the patient was  exhausted and on day #2 she underwent postpartum tubal ligation.  She did  well.  She was discharged home on postpartum day #3, ambulatory, on a  regular diet, on Tylox for pain, ferrous sulfate 325 mg p.o. daily, to see  me in 6 weeks.   DISCHARGE DIAGNOSES:  1. Status post induction of labor at term.  2. Postpartum tubal ligation.  3. Severe anemia.                                               Kathreen Cosier, M.D.    BAM/MEDQ  D:  07/13/2003  T:  07/13/2003  Job:  [id number]

## 2010-10-30 ENCOUNTER — Encounter: Payer: Self-pay | Admitting: Family Medicine

## 2010-10-30 ENCOUNTER — Ambulatory Visit (INDEPENDENT_AMBULATORY_CARE_PROVIDER_SITE_OTHER): Payer: Medicare Other | Admitting: Family Medicine

## 2010-10-30 VITALS — BP 114/75 | HR 96 | Temp 98.0°F | Wt 321.0 lb

## 2010-10-30 DIAGNOSIS — E669 Obesity, unspecified: Secondary | ICD-10-CM

## 2010-10-30 DIAGNOSIS — G894 Chronic pain syndrome: Secondary | ICD-10-CM

## 2010-10-30 DIAGNOSIS — F320 Major depressive disorder, single episode, mild: Secondary | ICD-10-CM

## 2010-10-30 MED ORDER — PREDNISONE 50 MG PO TABS: 50.0000 mg | ORAL_TABLET | Freq: Every day | ORAL | Status: AC

## 2010-10-30 MED ORDER — OXYCODONE-ACETAMINOPHEN 5-325 MG PO TABS: 1.0000 | ORAL_TABLET | Freq: Three times a day (TID) | ORAL | Status: DC | PRN

## 2010-10-30 NOTE — Assessment & Plan Note (Addendum)
Seems to not be improving will get markers for other inflammation properties. ESR CRP, then will see if need any more labs Will increase to oxycodone for short tmie told will not be a long term medication.  Pt in agreement Gave rx for prednisone to have pt fill it if labs are abnormal. Pt is switching pharmacy Pt had RA at 28 which is only mildly out of range and +ANA but titer was negative in the past.  May need to broaden autoimmune work up and consider further work up for myositis if necessary.

## 2010-10-30 NOTE — Patient Instructions (Signed)
I am sorry you are still hurting We will get some labs today.  I will call you with the results I am giving you a little oxycodone for now but this will be for short term I will give you a prescription for prednisone to have on hand if labs are elevated  Continue to try to do exercises and continue to lose weight I want to see you again in 4 weeks.

## 2010-10-30 NOTE — Assessment & Plan Note (Signed)
Did not make any strides this last month, has been in considerable pain, will have pt come back in 1 month again , if we can get pain controlled then will refocus on weight loss.  This likely will help with the pain the most.

## 2010-10-30 NOTE — Progress Notes (Signed)
  Subjective:    Patient ID: [name], female    DOB: Mar 20, 1976, 35 y.o.   MRN: [id number]  Knee Pain     1.  F/u on chronic pain-  Pt states the pain did get much worse this month, she remembers a bus ride that gave her a lot of pain and since then has been in extreme pain, pt states it mostly feels muscles and it is from the ankles, knees, back and even arms which is new this time. Pt has not changes anything else and no fever. Pt denies any illness and did take her sister oxycodone which did seem to help some.  2.  Depression-  Pt is very stable and is feeling good.  Denies Suicidal and Homicidal ideation    3.  Obesity-  Did not lose any weight, has not been able to do exercises and is feeling very frustrated at this time, is very motivated and would like to continue her walking regimen.   Pt continues to try to watch her diet.  Review of Systems  Denies fever, chills, nausea vomiting abdominal pain, dysuria, chest pain, shortness of breath dyspnea on exertion or numbness in extremities     Active Ambulatory Problems    Diagnosis Date Noted  . OBESITY, UNSPECIFIED 03/27/2010  . DEPRESSION, MAJOR, MILD 03/27/2010  . CHRONIC PAIN SYNDROME 03/27/2010  . NONALLOPATHIC LESION OF THORACIC REGION NEC 03/27/2010  . FATIGUE 03/27/2010  . HEADACHE 03/27/2010  . Nonallopathic lesion of lumbar region 07/04/2010  . Edema 08/21/2010   Resolved Ambulatory Problems    Diagnosis Date Noted  . No Resolved Ambulatory Problems   Past Medical History  Diagnosis Date  . Depression   . Back pain   . Obesity   . Migraines   . Anemia     Objective:   Physical Exam  Filed Vitals:   10/30/10 0906  BP: 114/75  Pulse: 96  Temp: 98 F (36.7 C)  TempSrc: Oral  Weight: 321 lb (145.605 kg)    Gen: NAD obese CV RRR no mumur Pul: CTAB Abd:  BS + NT ND Ext:  Trace edema to ankle bilaterally tight psoas bilateral, NVI, no crepitus at hip joint full passive ROM. Muscle soreness in most  large muscle groups. Lower back in mild spasm no spinous process tenderness. Skin:  No lesions       Assessment & Plan:

## 2010-10-30 NOTE — Assessment & Plan Note (Signed)
Stable no change to be made.

## 2010-10-31 LAB — SEDIMENTATION RATE: Sed Rate: 20 mm/hr (ref 0–22)

## 2010-11-01 ENCOUNTER — Telehealth: Payer: Self-pay | Admitting: Family Medicine

## 2010-11-01 DIAGNOSIS — G8929 Other chronic pain: Secondary | ICD-10-CM

## 2010-11-01 NOTE — Telephone Encounter (Signed)
Called and left message that labs slightly abnormal will refer to Rheum just to make sure.  Still feel no rheumatological issue is likely will monitor will send note to staff to send referral.

## 2010-11-05 ENCOUNTER — Telehealth: Payer: Self-pay | Admitting: *Deleted

## 2010-11-05 NOTE — Telephone Encounter (Signed)
Received call from Dr. Ines Bloomer office advising patient has appointment scheduled for 11/20/2010 at 10:30 AM. Patient has been notified.

## 2010-11-13 ENCOUNTER — Telehealth: Payer: Self-pay | Admitting: Family Medicine

## 2010-11-13 NOTE — Telephone Encounter (Signed)
Ms. Keasling need refill for her oxycodone and need to have dispensed for 120 tabs instead of 90.  Taking 2- 3 times daily.  One tab did not decrease pain.  Please call if any questions

## 2010-11-14 NOTE — Telephone Encounter (Signed)
Pt though would need to be seen if she wants more pain meds, will have her sign pain contract do not feel percocet is a long term solution and pt made aware of that at last visit.  I will not refill at this time. Attempted to call pt but no answer, will have wonderful staff call and relay message.

## 2010-11-15 NOTE — Telephone Encounter (Signed)
Called pt- no answer at home number, left voicemail to return call. Called mobile- female answered said was wrong number.

## 2010-11-18 NOTE — Telephone Encounter (Signed)
Spoke with pt- advised she will need appt with Dr. Katrinka Blazing if she is requesting additional pain meds.  Advised pt he will be in the office Wed, and she can schedule [place] appt for that day.  Pt states she is still in pain all over her body, and pain is chronic.  States she will schedule appt with Dr. Katrinka Blazing for Wed.

## 2010-11-20 ENCOUNTER — Encounter: Payer: Self-pay | Admitting: Family Medicine

## 2010-11-20 ENCOUNTER — Ambulatory Visit (INDEPENDENT_AMBULATORY_CARE_PROVIDER_SITE_OTHER): Payer: Medicare Other | Admitting: Family Medicine

## 2010-11-20 VITALS — BP 122/86 | HR 98 | Wt 323.0 lb

## 2010-11-20 DIAGNOSIS — G894 Chronic pain syndrome: Secondary | ICD-10-CM

## 2010-11-20 DIAGNOSIS — R51 Headache: Secondary | ICD-10-CM

## 2010-11-20 DIAGNOSIS — F320 Major depressive disorder, single episode, mild: Secondary | ICD-10-CM

## 2010-11-20 MED ORDER — OXYCODONE-ACETAMINOPHEN 7.5-325 MG PO TABS: 1.0000 | ORAL_TABLET | Freq: Three times a day (TID) | ORAL | Status: DC | PRN

## 2010-11-20 MED ORDER — MELOXICAM 15 MG PO TABS: 15.0000 mg | ORAL_TABLET | Freq: Every day | ORAL | Status: DC

## 2010-11-20 MED ORDER — KETOROLAC TROMETHAMINE 60 MG/2ML IM SOLN
60.0000 mg | Freq: Once | INTRAMUSCULAR | Status: AC
  Administered 2010-11-20: 60 mg via INTRAMUSCULAR

## 2010-11-20 MED ORDER — GABAPENTIN 100 MG PO CAPS: 100.0000 mg | ORAL_CAPSULE | Freq: Every day | ORAL | Status: DC

## 2010-11-20 NOTE — Assessment & Plan Note (Signed)
Stable at this time, but likely related to her chronic pain will treat that first and will go from there.

## 2010-11-20 NOTE — Patient Instructions (Signed)
I am sorry you are feeling so bad I will refer you to a pain clinic and we will see if they have any good ideas We will start Mobic 15mg  daily  Increase your neurontin to 300mg  at night and then 1 pill in AM and one pill at lunch I am refilling your Percocet at a higher dose as well.  I want to see you again in 1 month

## 2010-11-20 NOTE — Assessment & Plan Note (Signed)
Pt not doing any better and asking for more narcotics, do not feel comfortable taking control of this at this time.  Workup for Rheumatology condition were inconclusive, pt states she did see rheumatologist who stated that she overweight.  Did agree but does seem to be in significant pain.   Increase percocet dose but will refer to pain clinic for further evaluation and management.  Increase nueorntin to 300mg  at night.  Continue meloxicam and effexor as well gave side effects of meds pt denies any of them now.  RTC in 1 month

## 2010-11-20 NOTE — Progress Notes (Signed)
  Subjective:    Patient ID: [name], female    DOB: 21-Nov-1975, 35 y.o.   MRN: [id number]  Knee Pain     1.  F/u on chronic pain-  Pt states the pain did get much worse this month, unable to even get out of bed on her own anymore. Pt states she has been having pain all over and her headaches are back again.  Pt has taken all of her percocet and not doing well.  She has been out of for the last 2 days and the pain is excruciating.  Pt states that it is all the time and does not seem to get worse.  Worse place is her lower back still able to ambulate but slow and denies bowel or bladder problems.  Pt has been on effexor, percocet and started neurontin without any improvement.  Pt states the neurontin makes her tired and does not like it very much.  Denies Suicidal and Homicidal ideation     Review of Systems  Denies fever, chills, nausea vomiting abdominal pain, dysuria, chest pain, shortness of breath dyspnea on exertion or numbness in extremities     Active Ambulatory Problems    Diagnosis Date Noted  . OBESITY, UNSPECIFIED 03/27/2010  . DEPRESSION, MAJOR, MILD 03/27/2010  . CHRONIC PAIN SYNDROME 03/27/2010  . NONALLOPATHIC LESION OF THORACIC REGION NEC 03/27/2010  . FATIGUE 03/27/2010  . HEADACHE 03/27/2010  . Nonallopathic lesion of lumbar region 07/04/2010  . Edema 08/21/2010   Resolved Ambulatory Problems    Diagnosis Date Noted  . No Resolved Ambulatory Problems   Past Medical History  Diagnosis Date  . Depression   . Back pain   . Obesity   . Migraines   . Anemia     Objective:   Physical Exam  Filed Vitals:   11/20/10 1412  BP: 122/86  Pulse: 98  Weight: 323 lb (146.512 kg)    Gen: NAD obese CV RRR no mumur Pul: CTAB Abd:  BS + NT ND Ext:  Trace edema to ankle bilaterally tight psoas bilateral, NVI, no crepitus at hip joint full passive ROM. Muscle soreness in most large muscle groups. Lower back in mild spasm no spinous process tenderness. Skin:   No lesions MSK:  Pt is tender all over with touch does not notice any significant changes at this time, no red flags negative SLT pt though in too  Much pain for a total exam at this time.        Assessment & Plan:

## 2010-11-20 NOTE — Assessment & Plan Note (Signed)
Likely a component of this but only can augment with effexor at moment, may not be taking it at this time.

## 2010-11-26 ENCOUNTER — Ambulatory Visit: Payer: Medicare Other | Admitting: Family Medicine

## 2010-12-08 ENCOUNTER — Emergency Department (HOSPITAL_COMMUNITY)
Admission: EM | Admit: 2010-12-08 | Discharge: 2010-12-08 | Disposition: A | Discharge status: Home / Self Care | Payer: No Typology Code available for payment source | Attending: Emergency Medicine | Admitting: Emergency Medicine

## 2010-12-08 ENCOUNTER — Emergency Department (HOSPITAL_COMMUNITY): Payer: No Typology Code available for payment source

## 2010-12-08 DIAGNOSIS — Z8661 Personal history of infections of the central nervous system: Secondary | ICD-10-CM | POA: Insufficient documentation

## 2010-12-08 DIAGNOSIS — R42 Dizziness and giddiness: Secondary | ICD-10-CM | POA: Insufficient documentation

## 2010-12-08 DIAGNOSIS — F319 Bipolar disorder, unspecified: Secondary | ICD-10-CM | POA: Insufficient documentation

## 2010-12-08 DIAGNOSIS — S0990XA Unspecified injury of head, initial encounter: Secondary | ICD-10-CM | POA: Insufficient documentation

## 2010-12-08 DIAGNOSIS — R109 Unspecified abdominal pain: Secondary | ICD-10-CM | POA: Insufficient documentation

## 2010-12-08 DIAGNOSIS — R51 Headache: Secondary | ICD-10-CM | POA: Insufficient documentation

## 2010-12-08 LAB — DIFFERENTIAL
Basophils Absolute: 0 10*3/uL (ref 0.0–0.1)
Eosinophils Absolute: 0.1 10*3/uL (ref 0.0–0.7)
Monocytes Absolute: 0.9 10*3/uL (ref 0.1–1.0)
Neutrophils Relative %: 61 % (ref 43–77)

## 2010-12-08 LAB — URINE MICROSCOPIC-ADD ON

## 2010-12-08 LAB — URINALYSIS, ROUTINE W REFLEX MICROSCOPIC
Glucose, UA: NEGATIVE mg/dL
Ketones, ur: NEGATIVE mg/dL
Nitrite: NEGATIVE
Specific Gravity, Urine: 1.02 (ref 1.005–1.030)
pH: 6 (ref 5.0–8.0)

## 2010-12-08 LAB — COMPREHENSIVE METABOLIC PANEL
ALT: 9 U/L (ref 0–35)
Albumin: 3.4 g/dL — ABNORMAL LOW (ref 3.5–5.2)
Alkaline Phosphatase: 82 U/L (ref 39–117)
BUN: 12 mg/dL (ref 6–23)
Calcium: 9.5 mg/dL (ref 8.4–10.5)
Potassium: 3.7 mEq/L (ref 3.5–5.1)
Sodium: 135 mEq/L (ref 135–145)
Total Protein: 7.9 g/dL (ref 6.0–8.3)

## 2010-12-08 LAB — CBC
MCH: 14.9 pg — ABNORMAL LOW (ref 26.0–34.0)
MCHC: 28.8 g/dL — ABNORMAL LOW (ref 30.0–36.0)
RDW: 20.9 % — ABNORMAL HIGH (ref 11.5–15.5)

## 2010-12-08 MED ORDER — IOHEXOL 300 MG/ML  SOLN
125.0000 mL | Freq: Once | INTRAMUSCULAR | Status: AC | PRN
  Administered 2010-12-08: 125 mL via INTRAVENOUS

## 2010-12-17 ENCOUNTER — Encounter: Payer: Self-pay | Admitting: Family Medicine

## 2010-12-17 ENCOUNTER — Telehealth: Payer: Self-pay | Admitting: Family Medicine

## 2010-12-17 ENCOUNTER — Ambulatory Visit (INDEPENDENT_AMBULATORY_CARE_PROVIDER_SITE_OTHER): Payer: Medicare Other | Admitting: Family Medicine

## 2010-12-17 DIAGNOSIS — F320 Major depressive disorder, single episode, mild: Secondary | ICD-10-CM

## 2010-12-17 DIAGNOSIS — G894 Chronic pain syndrome: Secondary | ICD-10-CM

## 2010-12-17 DIAGNOSIS — E669 Obesity, unspecified: Secondary | ICD-10-CM

## 2010-12-17 MED ORDER — OXYCODONE-ACETAMINOPHEN 7.5-325 MG PO TABS: 1.0000 | ORAL_TABLET | Freq: Three times a day (TID) | ORAL | Status: DC | PRN

## 2010-12-17 NOTE — Assessment & Plan Note (Signed)
Stable- no changes needed at this time

## 2010-12-17 NOTE — Assessment & Plan Note (Signed)
Will refill percocet again, would like ot decrease dose at next visit in 1 month.  Seeing pain clinic in a couple weeks may be able to do injections.

## 2010-12-17 NOTE — Assessment & Plan Note (Signed)
Pt is continuing to try told to start swimming to help decrease pain on joints.  Encourage to continue diet.  Pt BMI is > 40 and would qualify for lap band procedure.  Pt given information and will discuss if it would like to have referral at that time.

## 2010-12-17 NOTE — Patient Instructions (Addendum)
You lost 4 pounds, keep it up! If you can try to get in the pool and do some walking for 30-45 minutes a day.  This will be much nicer on your joints. I will refill your percocet.   I want you to see pain clinic on Aug 13th. I am giving you information on lapband procedure. I want to see you again in 1 month.

## 2010-12-17 NOTE — Telephone Encounter (Signed)
Called pt and told her that I do not do disability paperwork and that I will not fillit out if that it is it.  She states this is more to delay payment on her loans due to chronic condition.  Told her to drop off forms and I will look at them when I get a chance.

## 2010-12-17 NOTE — Progress Notes (Signed)
  Subjective:    Patient ID: [name], female    DOB: [birth year], 35 y.o.   MRN: [id number]  Knee Pain    1.  F/u on chronic pain-  Pt states pain has improved somewhat, has tried to decrease her percocet on her own to twice a day but still needs three sometimes. Pt has been able to be with kids a little more often but still unable to work out. .  Pt states that it is all the time and does not seem to get worse.  Worse place is her lower back still able to ambulate but slow and denies bowel or bladder problems.  Pt is scheduled to see pain clinic next month as well. Pt was seen by rheumatolgy which states pain is from overweight.   2. Depression- Pt has been on effexor, percocet and started neurontin with minimal improvement.   Pt states the neurontin makes her tired and does not like it very much.  Denies Suicidal and Homicidal ideation    3.Obesity-  Trying to eat right, increased vegetables and fruit. Pt has lost 4 # unable to exercise due to pain.  Pt though understands this is aiding in the development of the chronic pain.   Review of Systems  Denies fever, chills, nausea vomiting abdominal pain, dysuria, chest pain, shortness of breath dyspnea on exertion or numbness in extremities     Active Ambulatory Problems    Diagnosis Date Noted  . OBESITY, UNSPECIFIED 03/27/2010  . DEPRESSION, MAJOR, MILD 03/27/2010  . CHRONIC PAIN SYNDROME 03/27/2010  . NONALLOPATHIC LESION OF THORACIC REGION NEC 03/27/2010  . FATIGUE 03/27/2010  . HEADACHE 03/27/2010  . Nonallopathic lesion of lumbar region 07/04/2010  . Edema 08/21/2010   Resolved Ambulatory Problems    Diagnosis Date Noted  . No Resolved Ambulatory Problems   Past Medical History  Diagnosis Date  . Depression   . Back pain   . Obesity   . Migraines   . Anemia     Objective:   Physical Exam  Filed Vitals:   12/17/10 0931  BP: 146/91  Pulse: 99  Temp: 98.1 F (36.7 C)  TempSrc: Oral  Weight: 319 lb (144.697 kg)      Gen: NAD obese CV RRR no mumur Pul: CTAB Abd:  BS + NT ND Ext:  No edema bilaterally tight psoas bilateral, NVI, no crepitus at hip joint full passive ROM. Muscle soreness in most large muscle groups. Lower back in mild spasm no spinous process tenderness. Skin:  No lesions MSK:  Pt is tender all over with touch does not notice any significant changes at this time, no red flags negative SLT pt improved overall with flexibility since last visit.         Assessment & Plan:

## 2010-12-17 NOTE — Telephone Encounter (Signed)
Was in earlier this morning but forgot to get Dr. Katrinka Blazing to complete a form.  She has questions about the appt as well and would like to speak with Dr. Katrinka Blazing.

## 2010-12-17 NOTE — Telephone Encounter (Signed)
Called pt looked at form and do not feel that I can fill out the form because I do not feel she is disabled completely. Pt voiced understanding and will come pick  Up form.

## 2010-12-21 ENCOUNTER — Encounter: Payer: Self-pay | Admitting: Family Medicine

## 2010-12-30 ENCOUNTER — Telehealth: Payer: Self-pay | Admitting: Family Medicine

## 2010-12-30 NOTE — Telephone Encounter (Signed)
Phone call from Robin at [facility] in [place] asking for our NPI # as pt is there to be seen in F/U for wt loss-start phentermine. Per Olegario Messier, RN here at [facility] NPI is given for this visit and a request is made to have notes faxed here for Dr Katrinka Blazing to review. Robin agreed.

## 2011-01-06 ENCOUNTER — Encounter: Payer: Medicare Other | Attending: Physical Medicine & Rehabilitation | Admitting: Physical Medicine & Rehabilitation

## 2011-01-06 DIAGNOSIS — M545 Low back pain, unspecified: Secondary | ICD-10-CM | POA: Insufficient documentation

## 2011-01-06 DIAGNOSIS — M25569 Pain in unspecified knee: Secondary | ICD-10-CM | POA: Insufficient documentation

## 2011-01-06 DIAGNOSIS — R51 Headache: Secondary | ICD-10-CM | POA: Insufficient documentation

## 2011-01-06 DIAGNOSIS — M47817 Spondylosis without myelopathy or radiculopathy, lumbosacral region: Secondary | ICD-10-CM | POA: Insufficient documentation

## 2011-01-06 DIAGNOSIS — G43019 Migraine without aura, intractable, without status migrainosus: Secondary | ICD-10-CM

## 2011-01-06 DIAGNOSIS — F3289 Other specified depressive episodes: Secondary | ICD-10-CM | POA: Insufficient documentation

## 2011-01-06 DIAGNOSIS — F329 Major depressive disorder, single episode, unspecified: Secondary | ICD-10-CM | POA: Insufficient documentation

## 2011-01-06 DIAGNOSIS — Z86718 Personal history of other venous thrombosis and embolism: Secondary | ICD-10-CM | POA: Insufficient documentation

## 2011-01-06 DIAGNOSIS — M549 Dorsalgia, unspecified: Secondary | ICD-10-CM | POA: Insufficient documentation

## 2011-01-06 DIAGNOSIS — M1710 Unilateral primary osteoarthritis, unspecified knee: Secondary | ICD-10-CM

## 2011-01-06 DIAGNOSIS — Z79899 Other long term (current) drug therapy: Secondary | ICD-10-CM | POA: Insufficient documentation

## 2011-01-06 DIAGNOSIS — M7989 Other specified soft tissue disorders: Secondary | ICD-10-CM | POA: Insufficient documentation

## 2011-01-06 DIAGNOSIS — IMO0001 Reserved for inherently not codable concepts without codable children: Secondary | ICD-10-CM

## 2011-01-06 DIAGNOSIS — G890 Central pain syndrome: Secondary | ICD-10-CM | POA: Insufficient documentation

## 2011-01-06 DIAGNOSIS — G4700 Insomnia, unspecified: Secondary | ICD-10-CM | POA: Insufficient documentation

## 2011-01-06 DIAGNOSIS — M79609 Pain in unspecified limb: Secondary | ICD-10-CM | POA: Insufficient documentation

## 2011-01-20 ENCOUNTER — Ambulatory Visit (INDEPENDENT_AMBULATORY_CARE_PROVIDER_SITE_OTHER): Payer: Medicare Other | Admitting: Family Medicine

## 2011-01-20 ENCOUNTER — Encounter: Payer: Self-pay | Admitting: Family Medicine

## 2011-01-20 DIAGNOSIS — G894 Chronic pain syndrome: Secondary | ICD-10-CM

## 2011-01-20 DIAGNOSIS — IMO0002 Reserved for concepts with insufficient information to code with codable children: Secondary | ICD-10-CM

## 2011-01-20 DIAGNOSIS — M7050 Other bursitis of knee, unspecified knee: Secondary | ICD-10-CM | POA: Insufficient documentation

## 2011-01-20 DIAGNOSIS — F320 Major depressive disorder, single episode, mild: Secondary | ICD-10-CM

## 2011-01-20 DIAGNOSIS — E669 Obesity, unspecified: Secondary | ICD-10-CM

## 2011-01-20 MED ORDER — OXYCODONE-ACETAMINOPHEN 7.5-325 MG PO TABS: 1.0000 | ORAL_TABLET | Freq: Four times a day (QID) | ORAL | Status: DC | PRN

## 2011-01-20 NOTE — Assessment & Plan Note (Signed)
Encouraged pt to continue with her regimen, pt gaol in 1 month 305.

## 2011-01-20 NOTE — Patient Instructions (Signed)
You are dooing great Your next goal weight is 305 I want to see you again in 1 month.

## 2011-01-20 NOTE — Assessment & Plan Note (Signed)
Will refill percocet, seen by pain clinic hopefully they will take over care at this time, will have pt back in 1 month, still related to pt obesity.

## 2011-01-20 NOTE — Assessment & Plan Note (Signed)
Discussed options with pt, will do oral prednisone pt has it at home, pt given sleeve as well to help with the swelling and give more support, will come back in 1 month.

## 2011-01-20 NOTE — Assessment & Plan Note (Signed)
Much improvement much more interactive than usual, smiling, pt seems to be doing much better.

## 2011-01-20 NOTE — Progress Notes (Signed)
  Subjective:    Patient ID: [name], female    DOB: July 03, 1975, 35 y.o.   MRN: [id number]  Knee Pain    1.  F/u on chronic pain-  Pt states pain has improved somewhat, pt states she still needs percocet.  Pt did see pain clinic and is hopefully following up soon. Has not gotten a prescription yet. Pt though able to do all activities of daily living but did not go to the beach with her kids due to the pain she had. Pt did increase her neurontin to 300mg .   2. Depression- Pt has been on effexor, and is feeling much better.  Denies Suicidal and Homicidal ideation.  Has notice she can deal with her kids better at this time, and today is smiling more.   3.Obesity-  Trying to eat right, increased vegetables and fruit. Pt has lost 10 # in last 2 months and is proud of her self.  Going to the gym regularly with walking and in the pool.  Was talking to pt about lapband last visit but now is motivated to lose weight on her own.   4.  Right knee pain-    Review of Systems  Denies fever, chills, nausea vomiting abdominal pain, dysuria, chest pain, shortness of breath dyspnea on exertion or numbness in extremities Past medical history, social, surgical and family history all reviewed.      Active Ambulatory Problems    Diagnosis Date Noted  . OBESITY, UNSPECIFIED 03/27/2010  . DEPRESSION, MAJOR, MILD 03/27/2010  . CHRONIC PAIN SYNDROME 03/27/2010  . NONALLOPATHIC LESION OF THORACIC REGION NEC 03/27/2010  . FATIGUE 03/27/2010  . HEADACHE 03/27/2010  . Nonallopathic lesion of lumbar region 07/04/2010  . Edema 08/21/2010   Resolved Ambulatory Problems    Diagnosis Date Noted  . No Resolved Ambulatory Problems   Past Medical History  Diagnosis Date  . Depression   . Back pain   . Obesity   . Migraines   . Anemia     Objective:   Physical Exam  Filed Vitals:   01/20/11 0856  BP: 161/84  Pulse: 114  Temp: 98.1 F (36.7 C)  TempSrc: Oral  Weight: 313 lb (141.976 kg)     Gen: NAD obese CV RRR no mumur Pul: CTAB Abd:  BS + NT ND Ext:  No edema bilaterally tight psoas bilateral, NVI, no crepitus at hip joint full passive ROM. Muscle soreness in most large muscle groups. Lower back in mild spasm no spinous process tenderness. Skin:  No lesions MSK:  Pt is tender all over with touch does not notice any significant changes at this time, no red flags negative SLT pt improved overall with flexibility since last visit.   Right knee pain over the pes anserine otherwise all ligaments in tact.      Assessment & Plan:

## 2011-02-07 ENCOUNTER — Ambulatory Visit: Payer: Medicare Other | Admitting: Physical Medicine & Rehabilitation

## 2011-02-21 ENCOUNTER — Ambulatory Visit (INDEPENDENT_AMBULATORY_CARE_PROVIDER_SITE_OTHER): Payer: Medicare Other | Admitting: Family Medicine

## 2011-02-21 ENCOUNTER — Encounter: Payer: Self-pay | Admitting: Family Medicine

## 2011-02-21 DIAGNOSIS — F320 Major depressive disorder, single episode, mild: Secondary | ICD-10-CM

## 2011-02-21 DIAGNOSIS — G894 Chronic pain syndrome: Secondary | ICD-10-CM

## 2011-02-21 DIAGNOSIS — E669 Obesity, unspecified: Secondary | ICD-10-CM

## 2011-02-21 MED ORDER — OXYCODONE-ACETAMINOPHEN 7.5-325 MG PO TABS
1.0000 | ORAL_TABLET | Freq: Four times a day (QID) | ORAL | Status: DC | PRN
Start: 2011-02-21 — End: 2011-03-20

## 2011-02-21 NOTE — Assessment & Plan Note (Signed)
Patient is making progress at this time new goal weight for one month will be 300 pounds. Discussed with patient the importance of timing with nutrition and meals as well as quality

## 2011-02-21 NOTE — Patient Instructions (Signed)
I am proud of you for losing weight.  Keep it up!!!! When you wake up have either a piece of fruit or a little milk or sugar free juice. When you work out still do the protein shakes before and then eat high protein low fat after you workout within 30 minutes.  Yogurt or chocolate milk Eat four small meals through the day instead of 2 large meals Try to get 8-10 cups of water Try not to eat within 3 hours of going to sleep.

## 2011-02-21 NOTE — Assessment & Plan Note (Signed)
No changes will be made. Patient is doing better but still taking Percocet every 6 hours. Patient is going to see a pain clinic chocolate will help as well as her losing weight without really help if something occurs for patient is not going to the pain clinic we are going to start decreasing her Percocet

## 2011-02-21 NOTE — Assessment & Plan Note (Signed)
Patient was very pleasant today and more excited than usual do feel the Effexor is helping as well as the exercising no change in regimen no suicidal or homicidal ideation

## 2011-02-21 NOTE — Progress Notes (Signed)
  Subjective:    Patient ID: [name], female    DOB: September 09, 1975, 34 y.o.   MRN: [id number]  HPI 1.  F/u on chronic pain-  Pt states pain has improved somewhat, pt states she still needs percocet.  Pt did see pain clinic and is hopefully following up soon was unable to go to the appointment because she had a court date. Has not gotten a prescription yet. Pt though able to do all activities of daily living but did not go to the beach with her kids due to the pain she had. Pt did increase her neurontin to 300mg .   2. Depression- Pt has been on effexor, and is feeling much better.  Denies Suicidal and Homicidal ideation.  Has notice she can deal with her kids better at this time, and today is smiling more.   3.Obesity-  Trying to eat right, increased vegetables and fruit. Pt has lost 16 # in last 3 months and is proud of her self.  Going to the gym regularly with walking and in the pool.  Was talking to pt about lapband last visit but now is motivated to lose weight on her own. Patient is accompanied with boyfriend today who is also very proud of her has been helping her with her nutrition but patient states she has not been eating regularly. She does have stretches before she works out we'll take several hours until she eats again after working out. Patient has a goal weight of 225 pounds but notes this will take some time  4.  Right knee pain-   Has been doing a little better overall. Still hurts when she walks more than 1 mile. She has been using her pain medications and states the best thing has been that she's been doing more pool exercises.  Review of Systems    Denies fever, chills, nausea vomiting abdominal pain, dysuria, chest pain, shortness of breath dyspnea on exertion or numbness in extremities Past medical history, social, surgical and family history all reviewed.   Objective:   Physical Exam  Gen: NAD obese CV RRR no mumur Pul: CTAB Abd:  BS + NT ND Ext:  No edema  bilaterally tight psoas bilateral, NVI, no crepitus at hip joint full passive ROM. Muscle soreness in most large muscle groups. Lower back in mild spasm no spinous process tenderness. Skin:  No lesions MSK:  Pt is tender all over with touch does not notice any significant changes at this time, no red flags negative SLT pt improved overall with flexibility since last visit.   Right knee pain over medial joint line pain is answering seems to be noninflamed patient has no crepitus all cruciate and collateral ligaments      Assessment & Plan:

## 2011-02-24 ENCOUNTER — Encounter: Payer: Medicare Other | Attending: Physical Medicine & Rehabilitation | Admitting: Physical Medicine & Rehabilitation

## 2011-02-27 LAB — URINALYSIS, ROUTINE W REFLEX MICROSCOPIC
Bilirubin Urine: NEGATIVE
Nitrite: NEGATIVE
Protein, ur: NEGATIVE mg/dL
Urobilinogen, UA: 1 mg/dL (ref 0.0–1.0)
pH: 6.5 (ref 5.0–8.0)

## 2011-02-27 LAB — URINE MICROSCOPIC-ADD ON

## 2011-03-06 LAB — CBC
HCT: 25.2 — ABNORMAL LOW
HCT: 29.6 — ABNORMAL LOW
Hemoglobin: 7.7 — CL
Hemoglobin: 8.9 — ABNORMAL LOW
MCV: 51.9 — ABNORMAL LOW
MCV: 51.9 — ABNORMAL LOW
Platelets: 179
Platelets: 225
RBC: 5.7 — ABNORMAL HIGH
WBC: 10.4
WBC: 11.3 — ABNORMAL HIGH

## 2011-03-06 LAB — COMPREHENSIVE METABOLIC PANEL
ALT: 10
AST: 11
AST: 13
Albumin: 3.5
Alkaline Phosphatase: 80
BUN: 5 — ABNORMAL LOW
CO2: 27
Calcium: 9.1
Chloride: 100
Creatinine, Ser: 0.68
Creatinine, Ser: 0.91
GFR calc Af Amer: >60
GFR calc non Af Amer: >60
Glucose, Bld: 110 — ABNORMAL HIGH
Potassium: 4.1
Sodium: 139
Total Bilirubin: 0.7
Total Protein: 6.8

## 2011-03-06 LAB — DIFFERENTIAL
Basophils Absolute: 0
Basophils Relative: 0
Eosinophils Absolute: 0
Monocytes Absolute: 0.9 — ABNORMAL HIGH
Neutro Abs: 8.7 — ABNORMAL HIGH
Neutrophils Relative %: 77

## 2011-03-06 LAB — CSF CELL COUNT WITH DIFFERENTIAL
Eosinophils, CSF: 1
Eosinophils, CSF: 1
Lymphs, CSF: 91 — ABNORMAL HIGH
Monocyte-Macrophage-Spinal Fluid: 7 — ABNORMAL LOW
RBC Count, CSF: 4 — ABNORMAL HIGH
Tube #: 1
WBC, CSF: 337 — ABNORMAL HIGH

## 2011-03-06 LAB — URINALYSIS, ROUTINE W REFLEX MICROSCOPIC
Glucose, UA: NEGATIVE
Hgb urine dipstick: NEGATIVE
Protein, ur: NEGATIVE
Specific Gravity, Urine: 1.014
pH: 7

## 2011-03-06 LAB — CULTURE, BLOOD (ROUTINE X 2)

## 2011-03-06 LAB — VITAMIN B12: Vitamin B-12: 521 (ref 211–911)

## 2011-03-06 LAB — URINE CULTURE
Colony Count: NO GROWTH
Culture: NO GROWTH

## 2011-03-06 LAB — HEPATITIS B SURFACE ANTIBODY,QUALITATIVE: Hep B S Ab: NEGATIVE

## 2011-03-06 LAB — RETICULOCYTES
Retic Count, Absolute: 70.7
Retic Ct Pct: 1.3

## 2011-03-06 LAB — HEPATITIS C ANTIBODY: HCV Ab: NEGATIVE

## 2011-03-06 LAB — HSV(HERPES SMPLX VRS)ABS-I+II(IGG)-CSF: HSV Type I/II Ab, IgG CSF: 0.6 IV

## 2011-03-06 LAB — PROTEIN AND GLUCOSE, CSF: Glucose, CSF: 67

## 2011-03-06 LAB — HSV 1 ANTIBODY, IGG: HSV 1 Glycoprotein G Ab, IgG: 0.05

## 2011-03-06 LAB — IRON AND TIBC: UIBC: 258

## 2011-03-06 LAB — HEPATITIS A ANTIBODY, IGM: Hep A IgM: NEGATIVE

## 2011-03-07 LAB — URINE MICROSCOPIC-ADD ON

## 2011-03-07 LAB — URINALYSIS, ROUTINE W REFLEX MICROSCOPIC
Glucose, UA: NEGATIVE
Hgb urine dipstick: NEGATIVE
Specific Gravity, Urine: 1.029
Urobilinogen, UA: 1
pH: 5.5

## 2011-03-07 LAB — URINE CULTURE

## 2011-03-10 ENCOUNTER — Telehealth: Payer: Self-pay | Admitting: Family Medicine

## 2011-03-10 NOTE — Telephone Encounter (Signed)
Wants to speak to Dr. Katrinka Blazing trying to get an appointment with the Pain Clinic.  She hasn't been able to get through to get an appointment.

## 2011-03-11 NOTE — Telephone Encounter (Signed)
Patient states she missed appointment with the pain clinic on 10/2 due to daughter having an appointment at the same time. She states she has been trying to call them ever since but has been unable to get in touch with anybody. Told her I would attempt to call on her behalf, fyi to MD.

## 2011-03-14 NOTE — Telephone Encounter (Signed)
Appointment scheduled for 04/23/11 at 11:20am at [facility], patient informed.

## 2011-03-20 ENCOUNTER — Encounter: Payer: Self-pay | Admitting: Family Medicine

## 2011-03-20 ENCOUNTER — Ambulatory Visit (INDEPENDENT_AMBULATORY_CARE_PROVIDER_SITE_OTHER): Payer: Medicaid Other | Admitting: Family Medicine

## 2011-03-20 DIAGNOSIS — G894 Chronic pain syndrome: Secondary | ICD-10-CM

## 2011-03-20 DIAGNOSIS — F320 Major depressive disorder, single episode, mild: Secondary | ICD-10-CM

## 2011-03-20 DIAGNOSIS — E669 Obesity, unspecified: Secondary | ICD-10-CM

## 2011-03-20 MED ORDER — OXYCODONE-ACETAMINOPHEN 7.5-325 MG PO TABS: 1.0000 | ORAL_TABLET | Freq: Four times a day (QID) | ORAL | Status: AC | PRN

## 2011-03-20 MED ORDER — OXYCODONE-ACETAMINOPHEN 7.5-325 MG PO TABS: 1.0000 | ORAL_TABLET | Freq: Four times a day (QID) | ORAL | Status: DC | PRN

## 2011-03-20 NOTE — Assessment & Plan Note (Signed)
Patient has had some deterioration since last time. We will make no changes though in her medication regimen. Told her at this time I would not go up on any narcotics and if so she would have to go to pain clinic with patient wants to avoid. Patient given 2 months worth of her Percocet.  Patient could increase her Neurontin to 300 mg 3 times a day the patient states that this does cause her to be a little somnolent. Patient will return in 2 months for reevaluation at that time it may be a good idea to get a urine test to make sure patient is actually taking her narcotics.

## 2011-03-20 NOTE — Progress Notes (Signed)
  Subjective:    Patient ID: [name], female    DOB: Aug 18, 1975, 35 y.o.   MRN: [id number]  HPI  #1 chronic pain: Patient is here for followup of her chronic pain. Patient states that it has been much worse recently especially with the change in weather. Patient states that she has not been able to do her regular activities as much as she usually does. Patient knows that her mood is in association with this pain. Patient denies any new pain or any type of injuries. Patient has continued to take her Percocet meloxicam Flexeril and Effexor on a regular basis. Patient is attempting to try to go to every 8 hours on her Percocet but notices that she needs to stay with the every 6. Patient denies any fevers or chills any bowel or bladder problems or any worsening of her fatigue.  #2 obesity. Patient is continuing to try her weight loss. Patient though has been starving herself more and not following the diet plan as we discussed at last visit. Patient also has not been working out as much do to her chronic pain. Patient states she had her pain better she maybe able to do more activities.  #3 depression he shouldn't has been taking her Effexor a regular basis states her mood is better than usual state she's been having some increased stress do to her looking for a new home and likely moving here in the near future. Patient denies any type of suicidal or homicidal ideation  Review of Systems As above stated in history of present illness   past medical and social history are reviewed with no changes Objective:   Physical Exam Gen: NAD obese CV RRR no mumur Pul: CTAB Abd:  BS + NT ND Ext:  No edema bilaterally tight psoas bilateral, NVI, . Muscle soreness in most large muscle groups. Lower back in mild spasm no spinous process tenderness.    Assessment & Plan:

## 2011-03-20 NOTE — Assessment & Plan Note (Signed)
Patient is on Effexor seems to be doing well denies any suicidal or homicidal ideation we'll continue current regimen.

## 2011-03-20 NOTE — Patient Instructions (Addendum)
hen you wake up have either a piece of fruit or a little milk or sugar free juice. When you work out still do the protein shakes before and then eat high protein low fat after you workout within 30 minutes.  Yogurt or chocolate milk Eat four small meals through the day instead of 2 large meals Try to get 8-10 cups of water Try not to eat within 3 hours of going to sleep. Try to eat something at least every four hours to keep your metabolism up.   I am sorry you are in pain  I am giving you refill on your pain meds for 2 months I want to see you again right after the holidays.

## 2011-03-20 NOTE — Assessment & Plan Note (Signed)
Encourage patient to continue to increase her exercise tolerance. Also discussed proper meal planning and timing of certain meals. Patient is to continue taking in water as she is as well for her drink of choice. Patient has a goal weight of less than 300 pounds at next visit

## 2011-04-23 ENCOUNTER — Encounter: Payer: Medicare Other | Attending: Physical Medicine & Rehabilitation | Admitting: Physical Medicine & Rehabilitation

## 2011-04-23 ENCOUNTER — Telehealth: Payer: Self-pay | Admitting: Family Medicine

## 2011-04-23 DIAGNOSIS — G43019 Migraine without aura, intractable, without status migrainosus: Secondary | ICD-10-CM

## 2011-04-23 DIAGNOSIS — IMO0001 Reserved for inherently not codable concepts without codable children: Secondary | ICD-10-CM

## 2011-04-23 DIAGNOSIS — M109 Gout, unspecified: Secondary | ICD-10-CM

## 2011-04-23 DIAGNOSIS — G4700 Insomnia, unspecified: Secondary | ICD-10-CM | POA: Insufficient documentation

## 2011-04-23 DIAGNOSIS — M47817 Spondylosis without myelopathy or radiculopathy, lumbosacral region: Secondary | ICD-10-CM

## 2011-04-23 DIAGNOSIS — E669 Obesity, unspecified: Secondary | ICD-10-CM

## 2011-04-23 DIAGNOSIS — F3289 Other specified depressive episodes: Secondary | ICD-10-CM | POA: Insufficient documentation

## 2011-04-23 DIAGNOSIS — F329 Major depressive disorder, single episode, unspecified: Secondary | ICD-10-CM | POA: Insufficient documentation

## 2011-04-23 DIAGNOSIS — G890 Central pain syndrome: Secondary | ICD-10-CM | POA: Insufficient documentation

## 2011-04-23 DIAGNOSIS — M1710 Unilateral primary osteoarthritis, unspecified knee: Secondary | ICD-10-CM | POA: Insufficient documentation

## 2011-04-23 NOTE — Telephone Encounter (Signed)
I will write a referral per pt request but do not entirely agree with the method of weight loss she is trying. I wish her the best.   I will send this to my clinic staff to help set it up (I will not be doing a letter)

## 2011-04-23 NOTE — Telephone Encounter (Signed)
Pt says she has been going to [facility] for her weight loss, says that's the only thing they see her for, she has medicare and medicaid and wants to know if Dr. Katrinka Blazing can approve her going there so its authorized, pt says she already discussed weight loss with Dr. Katrinka Blazing, she doesn't want to do the bypass surgery, she would like to work with [facility] to lose it slowly.

## 2011-04-23 NOTE — Assessment & Plan Note (Signed)
HISTORY:  Vivica is back regarding her multiple pain complaints.  She was originally seen in August 13 and has not been back since.  She stated that her daughter was sick and she had to miss her appointment. I received no further imaging regarding her low back which we requested. We increased her Neurontin to help control some of her generalized pain complaints.  We will add Voltaren gel for her knees.  She states that nothing new medically has happened since I last saw her.  She complains of significant diffuse pain 9 to 10/10, particularly in her low back and right knee.  She is also having headaches as well.  Pain remains sharp, stabbing, tingling, and aching.  Sleep is poor due to her pain problems. She had been given some oxycodone by her family physician, but apparently she states this was actually knocked into the toilet by her dog.  She has not been consistently using trazodone at night.  REVIEW OF SYSTEMS:  Notable for multiple issues including confusion, depression, anxiety, loss of taste, or suicidal thoughts.  Multiple other items were selected in our written health history section of the chart.  SOCIAL HISTORY:  The patient is single, living with 3 children.  Full social history in health and history section note.  Her boyfriend is with her today.  PHYSICAL EXAMINATION:  VITAL SIGNS:  Blood pressure is 133/76, pulse is 108, respiratory rate 18, and she is satting 99% on room air. GENERAL:  The patient is sitting in the chair in a slouched position. She is alert, but anxious.  She has difficulty transferring from a seated to standing position due to significant right knee pain and low back pain.  She remains morbidly obese.  Her mood is flat and anxious. Right knee is diffusely tender to touch.  I really could not manipulated any substance to weigh due to her pain.  Did not feel grossly warm.  Low back is not really be tested due to her intolerance of standing for  any period of time.  Strength other than the right knee appeared to be in the realm of 4/5, but again this is very inconsistent. HEART:  Regular rhythm but tachycardic. CHEST:  Clear. ABDOMEN:  Soft, nontender. SKIN:  Examining her skin, I saw no gross joint deformities in the upper extremities nor in the lower limbs.  ASSESSMENT: 1. Centralized pain syndrome. 2. Morbid obesity. 3. 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 4. Lumbar spine spondylosis. 5. Depression and likely PTSD which I feel playing a large role here. 6. Insomnia.  PLAN: 1. Ordered x-rays of the right knee.  If these demonstrate obvious     arthritis, we will inject the knee in approximately 2 weeks. 2. We will improve increase her gabapentin to 600 mg t.i.d. for     centralized pain components.  The patient revealed to me that she     has been on Topamax in the past which was stopped due to mental     clouding. 3. The patient's mood needs to be reassessed by a licensed     psychiatrist as I feel this is playing a large role here.  She has     a history of major depression and has psychiatric history in her     family. 4. I refilled her Percocet today to help decrease some of her acute     pain.  I gave her 7.5/325 one q.6 hours p.r.n. #75. 5. I wrote the patient  prednisone taper to decrease her acute joint     pain. 6. Ordered lab work including ANA and rheumatoid factor, sed rate,     uric acid level, CBC, and BMET. 7. Still need to acquire MRIs of the patient's lumbar spine at some     point, however, we have to work through this 1 step at a time, as     there appeared to be multiple layers to this presentation.     Ranelle Oyster, M.D. Electronically Signed    ZTS/MedQ D:  04/23/2011 15:01:02  T:  04/23/2011 17:16:00  Job #:  [id number]

## 2011-04-24 ENCOUNTER — Telehealth: Payer: Self-pay | Admitting: *Deleted

## 2011-04-24 NOTE — Telephone Encounter (Signed)
Returned call to patient.  She has been getting weight loss pills from [facility] and feels med is helping.  Has been going to [facility] since July and weight down to 299 lbs.  Patient does not want to have surgery.  Has an appt in February 2013.  Has Medicare/Medicaid and needs a referral from our office.  Will complete referral per Dr. Katrinka Blazing and call patient back if we have any additional questions or problems. Gaylene Brooks, RN

## 2011-04-24 NOTE — Telephone Encounter (Signed)
Error. [name]  

## 2011-04-24 NOTE — Telephone Encounter (Signed)
Called and LMOVM of [facility] for callback.  Cancelled referral in workqueue.  Fleeger, Maryjo [place]

## 2011-05-08 ENCOUNTER — Ambulatory Visit (HOSPITAL_COMMUNITY)
Admission: RE | Admit: 2011-05-08 | Discharge: 2011-05-08 | Disposition: A | Discharge status: Home / Self Care | Payer: Medicare Other | Source: Ambulatory Visit | Attending: Neurosurgery | Admitting: Neurosurgery

## 2011-05-08 ENCOUNTER — Other Ambulatory Visit: Payer: Self-pay | Admitting: Neurosurgery

## 2011-05-08 DIAGNOSIS — R52 Pain, unspecified: Secondary | ICD-10-CM

## 2011-05-08 DIAGNOSIS — M25569 Pain in unspecified knee: Secondary | ICD-10-CM | POA: Insufficient documentation

## 2011-05-09 ENCOUNTER — Encounter: Payer: Medicare Other | Attending: Physical Medicine & Rehabilitation | Admitting: Physical Medicine & Rehabilitation

## 2011-05-09 ENCOUNTER — Telehealth: Payer: Self-pay | Admitting: Family Medicine

## 2011-05-09 DIAGNOSIS — M1710 Unilateral primary osteoarthritis, unspecified knee: Secondary | ICD-10-CM

## 2011-05-09 DIAGNOSIS — G890 Central pain syndrome: Secondary | ICD-10-CM | POA: Insufficient documentation

## 2011-05-09 DIAGNOSIS — IMO0001 Reserved for inherently not codable concepts without codable children: Secondary | ICD-10-CM

## 2011-05-09 DIAGNOSIS — M47817 Spondylosis without myelopathy or radiculopathy, lumbosacral region: Secondary | ICD-10-CM

## 2011-05-09 DIAGNOSIS — D649 Anemia, unspecified: Secondary | ICD-10-CM | POA: Insufficient documentation

## 2011-05-09 DIAGNOSIS — G894 Chronic pain syndrome: Secondary | ICD-10-CM

## 2011-05-09 DIAGNOSIS — G4700 Insomnia, unspecified: Secondary | ICD-10-CM | POA: Insufficient documentation

## 2011-05-09 DIAGNOSIS — M25569 Pain in unspecified knee: Secondary | ICD-10-CM | POA: Insufficient documentation

## 2011-05-09 NOTE — Telephone Encounter (Signed)
Patient calling asking for Shanda Bumps asap, says she is now having problems filling the rx.

## 2011-05-09 NOTE — Telephone Encounter (Signed)
Closing encounter, will await call back

## 2011-05-09 NOTE — Telephone Encounter (Signed)
She will have to pay out of pocket for meds, but ok will have to come from Dr. Riley Kill office.  Pt informed.  Appt moved up to this Tuesday. Paden Kuras, Maryjo [place]

## 2011-05-09 NOTE — Telephone Encounter (Signed)
Agree with plan, thank you for all your help to the wonderful nursing staff.

## 2011-05-09 NOTE — Telephone Encounter (Signed)
Pt wants to speak with RN asap, did not want to give details

## 2011-05-09 NOTE — Telephone Encounter (Signed)
Pt states that she does not like her Pain management MD, states that he is not understanding and does not even try to listen to her.  Says that he only gives her 75 pills at a time and it causes problems with her insurance.  Spoke with Dr. Katrinka Blazing, he is ok with referring somewhere else.  Will refer to [facility] (per Dr. Katrinka Blazing).  As far as the meds pt has a Rx for #75 that Dr. Hermelinda Medicus gave her, advised to go ahead and fill that and if she had problems with her insurance paying for it to give [place] a call back.  Pt has appt with Dr. Katrinka Blazing for Dec 27th  [name], [name]

## 2011-05-09 NOTE — Assessment & Plan Note (Signed)
[name] is back regarding her multiple pain complaints.  She has had her x-rays done yesterday and I was able to review this.  There is really no findings that I could see on there.  Continues to complain a lot of right knee pain.  She has a very poor historian today.  Significant other is with her, and states that she has had a lot of stress on her. She does go to bed until late at night.  Her pain is a "25 out of 10 today."  Pain is described as sharp, burning, stabbing, tingling, and aching.  REVIEW OF SYSTEMS:  Notable for multiple items all of which are checked in our review of systems section.  SOCIAL HISTORY:  The patient is single, living with 3 children, has been divulged which is taking place at home.  PHYSICAL EXAMINATION:  GENERAL:  The patient appears sleepy, lethargic at times.  She responds when she feels like responding.  She complains of generalized pain. MUSCULOSKELETAL:  Exam of the right leg, there was bruising over the distal aspect with some swelling.  When I tried some manipulative maneuver, she healed out in pain.  She appears quite depressed.  She remains morbidly obese.  She has generalized tenderness throughout on exam today, but in general, it is difficult to examine given her behavioral issues. HEART:  Regular. CHEST:  Clear.  ASSESSMENT: 1. Centralized pain syndrome likely fueled by depression and     posttraumatic stress disorder. 2. Right knee pain.  X-rays are negative.  Cannot rule out the soft     tissue disorder. 3. Anemia, per lab work to be performed at the last visit. 4. Insomnia. 5. Morbid obesity.  PLAN: 1. Explained to the patient and her friend that unless we can get a     handle on her mood issues, it will be difficult for any "pain mgt     plan" to be successful. 2. I will send her for an MRI of her right knee to wear out meniscal     injury. 3. I asked the patient to follow up with Psychiatry regarding her mood     and her family  physician regarding her anemia.  Patient disclosed     to me that she has been anemic since she was young. 4. Refill Percocet 7.5/325 one q.6 hours p.r.n., #60. 5. Our nurse practitioner will see the patient back in a month to     review MRI findings and to discuss further treatment plan.  Again     improvement in her psychosocial situation is paramount here.     Ranelle Oyster, M.D. Electronically Signed    ZTS/MedQ D:  05/09/2011 13:15:31  T:  05/09/2011 21:05:06  Job #:  [id number]

## 2011-05-12 NOTE — Telephone Encounter (Signed)
Addended by: Jone Baseman D on: 05/12/2011 12:09 PM   Modules accepted: Orders

## 2011-05-13 ENCOUNTER — Ambulatory Visit (INDEPENDENT_AMBULATORY_CARE_PROVIDER_SITE_OTHER): Payer: Medicare Other | Admitting: Family Medicine

## 2011-05-13 ENCOUNTER — Encounter: Payer: Self-pay | Admitting: Family Medicine

## 2011-05-13 DIAGNOSIS — G894 Chronic pain syndrome: Secondary | ICD-10-CM

## 2011-05-13 DIAGNOSIS — F320 Major depressive disorder, single episode, mild: Secondary | ICD-10-CM

## 2011-05-13 DIAGNOSIS — E669 Obesity, unspecified: Secondary | ICD-10-CM

## 2011-05-13 MED ORDER — GABAPENTIN 300 MG PO CAPS: 600.0000 mg | ORAL_CAPSULE | Freq: Three times a day (TID) | ORAL | Status: DC

## 2011-05-13 NOTE — Progress Notes (Signed)
  Subjective:    Patient ID: [name], female    DOB: 1975/08/02, 35 y.o.   MRN: [id number]  HPI 35 year old female who's been seen for chronic pain syndrome for some time. Patient was sent to pain clinic and saw Dr. Riley Kill.  Patient did not get along with said Dr. and would like to be referred somewhere else. This referral has been put in and we're waiting for acceptance. Patient is continuing to take her oxycodone 7.5/325 mg tabs every 6 hours. In Dr. at the pain clinic was attempting to decrease the amount and had not been too successful. Patient had increased her Neurontin to 600 mg 3 times a day and she states that this has not improved her pain either. Patient states that she has pain everywhere most focally though around the right knee which she did have x-rays done recently. X-rays are reviewed today showed good joint space no edema and no signs of any orthopedic problems. Patient also states that she's had some back pain she states is from her lumbar up to her cervical region. Patient denies any relation of the pain still able to do most of her activities of daily living and continued to work out if she has her medications. Patient has attempted physical therapy in the past as well as psychology and neither were effective.  Obesity patient has lost weight since last visit Wt Readings from Last 3 Encounters:  05/13/11 299 lb (135.626 kg)  03/20/11 306 lb (138.801 kg)  02/21/11 306 lb 14.4 oz (139.209 kg)   patient is continuing to attempt to the gym as well as watch her diet. Patient does state she does have some fatigue as well as lower extremity swelling.  Patient is excited that she is under 300 pounds and has a goal in the 185 pounds by next Christmas.  Depression she continues to take her Effexor has no side effects and is feeling optimistic about the future. Patient denies any suicidal or homicidal ideation  Review of Systems     Objective:   Physical Exam Gen: NAD obese CV  RRR no mumur Pul: CTAB Abd:  BS + NT ND Ext:  No edema noted the patient did have some bruising intermittently on the right leg. Patient denies any type of trauma. Reviewed patient's recent labs at outside source patient's hemoglobin stable around her baseline of 8.0 platelets of 325    Assessment & Plan:

## 2011-05-13 NOTE — Assessment & Plan Note (Signed)
Discussed at length with patient and we will refer to the pain clinic. Would really like to decrease his Percocet and told her about the complications of this medication including addictiveness and tolerance. Patient understands and would like to try but it does help her be somewhat active. Patient though since last visit has gained disability from another doctor but is also sitting in the room with 3 different cell phones. Stress with patient if for some reason the pain clinic does not accept her I will not do Percocet and we will switch her to Vicodin and try to treat it down. Patient to followup as needed.

## 2011-05-13 NOTE — Patient Instructions (Signed)
It is good to see you. I am sorry that you had bad experience at the pain clinic. We are attempting to get she set up with another pain clinic. I really think it would be a good thing to get you off the Percocet and the long run. I encourage you to try the Neurontin at the 600 mg 3 times a day. Continue the meloxicam We will call and try to get the labs from Dr. Riley Kill office.  Continue to try to work out and lose weight. This is the best thing you can do for yourself and you're doing very well. Keep it up! At the holidays and happy new year.

## 2011-05-13 NOTE — Assessment & Plan Note (Signed)
Continue Effexor at high dose patient declines psychologist again today.

## 2011-05-13 NOTE — Assessment & Plan Note (Signed)
Encourage patient to continue exercising and try to lose weight. If patient would allow I will check her thyroid again to make sure this is not a subclinical thyroid. Patient declined blood draw today. Rediscussed proper diets and exercise and timing of both throughout the day.

## 2011-05-22 ENCOUNTER — Telehealth: Payer: Self-pay | Admitting: Family Medicine

## 2011-05-22 ENCOUNTER — Ambulatory Visit: Payer: Medicare Other | Admitting: Family Medicine

## 2011-05-22 NOTE — Telephone Encounter (Signed)
Pt informed of [facility]. Fleeger, Maryjo [place]

## 2011-05-22 NOTE — Telephone Encounter (Signed)
Pt is asking where she is supposed to go today for Pain Clinic.  Can't remember what the name is and where it is.  pls call and let her know

## 2011-06-02 DIAGNOSIS — G43909 Migraine, unspecified, not intractable, without status migrainosus: Secondary | ICD-10-CM | POA: Insufficient documentation

## 2011-06-02 DIAGNOSIS — F419 Anxiety disorder, unspecified: Secondary | ICD-10-CM | POA: Insufficient documentation

## 2011-06-02 DIAGNOSIS — R030 Elevated blood-pressure reading, without diagnosis of hypertension: Secondary | ICD-10-CM | POA: Insufficient documentation

## 2011-06-02 DIAGNOSIS — M2550 Pain in unspecified joint: Secondary | ICD-10-CM | POA: Insufficient documentation

## 2011-06-13 ENCOUNTER — Encounter: Payer: Medicare Other | Admitting: Neurosurgery

## 2011-06-19 ENCOUNTER — Ambulatory Visit (HOSPITAL_COMMUNITY)
Admission: RE | Admit: 2011-06-19 | Discharge: 2011-06-19 | Disposition: A | Discharge status: Home / Self Care | Payer: Medicare Other | Source: Ambulatory Visit | Attending: Family Medicine | Admitting: Family Medicine

## 2011-06-19 ENCOUNTER — Encounter: Payer: Self-pay | Admitting: Family Medicine

## 2011-06-19 ENCOUNTER — Other Ambulatory Visit: Payer: Self-pay

## 2011-06-19 ENCOUNTER — Ambulatory Visit (INDEPENDENT_AMBULATORY_CARE_PROVIDER_SITE_OTHER): Payer: Medicare Other | Admitting: Family Medicine

## 2011-06-19 VITALS — BP 113/77 | HR 108 | Temp 98.0°F | Ht 67.0 in | Wt 295.9 lb

## 2011-06-19 DIAGNOSIS — Z79899 Other long term (current) drug therapy: Secondary | ICD-10-CM

## 2011-06-19 DIAGNOSIS — F320 Major depressive disorder, single episode, mild: Secondary | ICD-10-CM

## 2011-06-19 DIAGNOSIS — F411 Generalized anxiety disorder: Secondary | ICD-10-CM | POA: Insufficient documentation

## 2011-06-19 DIAGNOSIS — G894 Chronic pain syndrome: Secondary | ICD-10-CM

## 2011-06-19 DIAGNOSIS — E669 Obesity, unspecified: Secondary | ICD-10-CM

## 2011-06-19 NOTE — Assessment & Plan Note (Signed)
Patient is being seen in a weight loss clinic and is on phentermine. Feel like I should potentially get an EKG to get a baseline we'll send patient a copy so she can continue to be monitored for safe weight loss loss. Will followup as needed

## 2011-06-19 NOTE — Patient Instructions (Signed)
Is great to see you.  I'm glad you're pain is better and you were seen a pain clinic they can really help you with the medications. I am impressed by her weight loss! you should be proud how about a goal of 285 by Valentine's Day. I think you were doing great I should see you again in 3-4 months for followup on her depression.

## 2011-06-19 NOTE — Assessment & Plan Note (Signed)
Patient is doing very well on the Effexor seems to be having better affect and is doing more things regularly. Patient is very motivated to lose weight which is great to see. We'll continue current regimen followup as needed.

## 2011-06-19 NOTE — Progress Notes (Signed)
  Subjective:    Patient ID: [name], female    DOB: [birth year], 36 y.o.   MRN: [id number]  HPI  36 year old female who's been seen for chronic pain syndrome Patient is now established with the pain clinic we'll not be getting any pain medication from eating more. Patient states that he started her on Dilaudid did not like the medication and now has gone to higher dose Percocet. Patient states that it has helped she is doing better and is helping her stay active which is helps her lose weight which is also helping her pain.   Obesity patient has lost weight since last visit Wt Readings from Last 3 Encounters:  06/19/11 295 lb 14.4 oz (134.219 kg)  05/13/11 299 lb (135.626 kg)  03/20/11 306 lb (138.801 kg)   patient has lost more weight and is actually seeing a weight loss clinic now and is on phentermine. Patient has no history of having an EKG done. Patient denies any palpitations any irregular heart rates or any chest pain. Patient has lost another 4 pounds continues to exercise continues to watch her diet. Patient is very motivated has a goal to be another 10 pounds lighter by 2/14 . Depression she continues to take her Effexor has no side effects and is feeling optimistic about the future. Patient denies any suicidal or homicidal ideation. Patient is happy with her regimen to someone make a change.  Review of Systems As stated in history of present illness    Objective:   Physical Exam  vitals reviewed Gen: NAD obese CV RRR no mumur Pul: CTAB Abd:  BS + NT ND Ext: Trace edema neurovascularly intact 2+ distal pulses 2+ DTRs    Assessment & Plan:

## 2011-06-19 NOTE — Assessment & Plan Note (Signed)
Patient is very happy with her chronic pain center. Is on a better regimen now and feels like she is doing well. Will continue to monitor from afar.

## 2011-08-18 DIAGNOSIS — D509 Iron deficiency anemia, unspecified: Secondary | ICD-10-CM | POA: Insufficient documentation

## 2011-08-18 DIAGNOSIS — E559 Vitamin D deficiency, unspecified: Secondary | ICD-10-CM | POA: Insufficient documentation

## 2011-10-27 NOTE — H&P (Signed)
NAMEJENIFER, STRUVE                ACCOUNT NO.:  [id number]  MEDICAL RECORD NO.:  [id number]  LOCATION:  [facility]                         FACILITY:  [facility]  PHYSICIAN:  Kathreen Cosier, M.D.DATE OF BIRTH:  1976-02-05  DATE OF ADMISSION:  10/23/2011 DATE OF DISCHARGE:                             HISTORY & PHYSICAL   The patient is a 36 year old, gravida 3, para 3-0-0-3, who was referred from her primary care doctor because of abnormal Pap smear, and she had a colposcopy performed in my office on Oct 08, 2011, which showed moderate and severe dysplasia and so she had intracervical conization. The patient has a history anemia and was placed on iron which she has not taken.  Her last hemoglobin in my office on May 11, was 6.5, and at that time, she was told she needs a hysteroscopy and D and C, but however, she has not followed up on that.  PAST SURGICAL HISTORY:  Negative.  SYSTEM REVIEW:  Noncontributory.  PHYSICAL:  GENERAL: Obese female, in no distress. HEENT: Negative. BREASTS: Negative. HEART: Regular rhythm.  No murmurs, no gallops. LUNGS: Clear to P and A. ABDOMEN: Obese. PHYSICAL BIMANUAL EXAM: Negative, and her examination of her cervix, the cervix was clean.  Vagina and external genitalia normal. EXTREMITIES: Negative.          ______________________________ Kathreen Cosier, M.D.     BAM/MEDQ  D:  10/27/2011  T:  10/27/2011  Job:  [id number]

## 2011-10-28 ENCOUNTER — Encounter (HOSPITAL_COMMUNITY)
Admission: RE | Admit: 2011-10-28 | Discharge: 2011-10-28 | Disposition: A | Discharge status: Home / Self Care | Payer: Medicare Other | Source: Ambulatory Visit | Attending: Obstetrics | Admitting: Obstetrics

## 2011-10-28 ENCOUNTER — Encounter (HOSPITAL_COMMUNITY): Payer: Self-pay

## 2011-10-28 LAB — CBC
HCT: 26.7 % — ABNORMAL LOW (ref 36.0–46.0)
Hemoglobin: 7.2 g/dL — ABNORMAL LOW (ref 12.0–15.0)
MCH: 13.8 pg — ABNORMAL LOW (ref 26.0–34.0)
MCHC: 27 g/dL — ABNORMAL LOW (ref 30.0–36.0)
MCV: 51.2 fL — ABNORMAL LOW (ref 78.0–100.0)
RDW: 21.3 % — ABNORMAL HIGH (ref 11.5–15.5)

## 2011-10-28 NOTE — Patient Instructions (Addendum)
YOUR PROCEDURE IS SCHEDULED ON:10/30/11  ENTER THROUGH THE MAIN ENTRANCE OF [facility] AT:0730 am  USE DESK PHONE AND DIAL [id number] TO INFORM us OF YOUR ARRIVAL  CALL [phone] IF YOU HAVE ANY QUESTIONS OR PROBLEMS PRIOR TO YOUR ARRIVAL.  REMEMBER: DO NOT EAT OR DRINK AFTER MIDNIGHT :Wed.   YOU MAY BRUSH YOUR TEETH THE MORNING OF SURGERY   TAKE THESE MEDICINES THE DAY OF SURGERY WITH SIP OF WATER:   DO NOT WEAR JEWELRY, EYE MAKEUP, LIPSTICK OR DARK FINGERNAIL POLISH DO NOT WEAR LOTIONS  DO NOT SHAVE FOR 48 HOURS PRIOR TO SURGERY  YOU WILL NOT BE ALLOWED TO DRIVE YOURSELF HOME.  NAME OF DRIVER:[phone]

## 2011-10-28 NOTE — Pre-Procedure Instructions (Signed)
Dr. Sherron Ales made aware of pt's hgb result of 7.2

## 2011-10-30 ENCOUNTER — Ambulatory Visit (HOSPITAL_COMMUNITY): Payer: Medicare Other | Admitting: Anesthesiology

## 2011-10-30 ENCOUNTER — Ambulatory Visit (HOSPITAL_COMMUNITY)
Admission: RE | Admit: 2011-10-30 | Discharge: 2011-10-30 | Disposition: A | Discharge status: Home / Self Care | Payer: Medicare Other | Source: Ambulatory Visit | Attending: Obstetrics | Admitting: Obstetrics

## 2011-10-30 ENCOUNTER — Encounter (HOSPITAL_COMMUNITY): Payer: Self-pay | Admitting: Anesthesiology

## 2011-10-30 ENCOUNTER — Encounter (HOSPITAL_COMMUNITY)
Admission: RE | Disposition: A | Discharge status: Home / Self Care | Payer: Self-pay | Source: Ambulatory Visit | Attending: Obstetrics

## 2011-10-30 DIAGNOSIS — E669 Obesity, unspecified: Secondary | ICD-10-CM

## 2011-10-30 DIAGNOSIS — F320 Major depressive disorder, single episode, mild: Secondary | ICD-10-CM

## 2011-10-30 DIAGNOSIS — N949 Unspecified condition associated with female genital organs and menstrual cycle: Secondary | ICD-10-CM | POA: Insufficient documentation

## 2011-10-30 DIAGNOSIS — N938 Other specified abnormal uterine and vaginal bleeding: Secondary | ICD-10-CM | POA: Insufficient documentation

## 2011-10-30 DIAGNOSIS — M7050 Other bursitis of knee, unspecified knee: Secondary | ICD-10-CM

## 2011-10-30 DIAGNOSIS — Z01818 Encounter for other preprocedural examination: Secondary | ICD-10-CM | POA: Insufficient documentation

## 2011-10-30 DIAGNOSIS — R609 Edema, unspecified: Secondary | ICD-10-CM

## 2011-10-30 DIAGNOSIS — Z01812 Encounter for preprocedural laboratory examination: Secondary | ICD-10-CM | POA: Insufficient documentation

## 2011-10-30 DIAGNOSIS — R51 Headache: Secondary | ICD-10-CM

## 2011-10-30 DIAGNOSIS — D069 Carcinoma in situ of cervix, unspecified: Secondary | ICD-10-CM | POA: Insufficient documentation

## 2011-10-30 DIAGNOSIS — G894 Chronic pain syndrome: Secondary | ICD-10-CM

## 2011-10-30 DIAGNOSIS — R5383 Other fatigue: Secondary | ICD-10-CM

## 2011-10-30 DIAGNOSIS — M999 Biomechanical lesion, unspecified: Secondary | ICD-10-CM

## 2011-10-30 HISTORY — PX: DILATION AND CURETTAGE OF UTERUS: SHX78

## 2011-10-30 HISTORY — PX: CERVICAL CONIZATION W/BX: SHX1330

## 2011-10-30 SURGERY — CONE BIOPSY, CERVIX
Anesthesia: General | Site: Vagina | Wound class: Clean Contaminated

## 2011-10-30 MED ORDER — ONDANSETRON HCL 4 MG/2ML IJ SOLN
INTRAMUSCULAR | Status: AC
  Filled 2011-10-30: qty 2

## 2011-10-30 MED ORDER — MIDAZOLAM HCL 5 MG/5ML IJ SOLN
INTRAMUSCULAR | Status: DC | PRN
  Administered 2011-10-30: 2 mg via INTRAVENOUS

## 2011-10-30 MED ORDER — FENTANYL CITRATE 0.05 MG/ML IJ SOLN
INTRAMUSCULAR | Status: AC
  Filled 2011-10-30: qty 2

## 2011-10-30 MED ORDER — PROPOFOL 10 MG/ML IV EMUL
INTRAVENOUS | Status: AC
  Filled 2011-10-30: qty 20

## 2011-10-30 MED ORDER — ONDANSETRON HCL 4 MG/2ML IJ SOLN
INTRAMUSCULAR | Status: DC | PRN
  Administered 2011-10-30: 4 mg via INTRAVENOUS

## 2011-10-30 MED ORDER — FENTANYL CITRATE 0.05 MG/ML IJ SOLN
25.0000 ug | INTRAMUSCULAR | Status: DC | PRN
  Administered 2011-10-30: 50 ug via INTRAVENOUS

## 2011-10-30 MED ORDER — FENTANYL CITRATE 0.05 MG/ML IJ SOLN
INTRAMUSCULAR | Status: DC | PRN
  Administered 2011-10-30: 25 ug via INTRAVENOUS
  Administered 2011-10-30: 50 ug via INTRAVENOUS

## 2011-10-30 MED ORDER — LIDOCAINE HCL 1 % IJ SOLN
INTRAMUSCULAR | Status: DC | PRN
  Administered 2011-10-30: 10 mL

## 2011-10-30 MED ORDER — LIDOCAINE HCL (CARDIAC) 20 MG/ML IV SOLN
INTRAVENOUS | Status: DC | PRN
  Administered 2011-10-30: 60 mg via INTRAVENOUS

## 2011-10-30 MED ORDER — METOCLOPRAMIDE HCL 5 MG/ML IJ SOLN: 10.0000 mg | Freq: Once | INTRAMUSCULAR | Status: DC | PRN

## 2011-10-30 MED ORDER — KETOROLAC TROMETHAMINE 30 MG/ML IJ SOLN
INTRAMUSCULAR | Status: DC | PRN
  Administered 2011-10-30: 30 mg via INTRAVENOUS

## 2011-10-30 MED ORDER — LACTATED RINGERS IV SOLN
INTRAVENOUS | Status: DC
  Administered 2011-10-30 (×3): via INTRAVENOUS

## 2011-10-30 MED ORDER — FENTANYL CITRATE 0.05 MG/ML IJ SOLN
INTRAMUSCULAR | Status: AC
  Administered 2011-10-30: 50 ug via INTRAVENOUS
  Filled 2011-10-30: qty 2

## 2011-10-30 MED ORDER — PROPOFOL 10 MG/ML IV EMUL
INTRAVENOUS | Status: DC | PRN
  Administered 2011-10-30: 200 mg via INTRAVENOUS

## 2011-10-30 MED ORDER — LIDOCAINE HCL (CARDIAC) 20 MG/ML IV SOLN
INTRAVENOUS | Status: AC
  Filled 2011-10-30: qty 5

## 2011-10-30 MED ORDER — MEPERIDINE HCL 25 MG/ML IJ SOLN: 6.2500 mg | INTRAMUSCULAR | Status: DC | PRN

## 2011-10-30 MED ORDER — MIDAZOLAM HCL 2 MG/2ML IJ SOLN
INTRAMUSCULAR | Status: AC
  Filled 2011-10-30: qty 2

## 2011-10-30 SURGICAL SUPPLY — 22 items
BLADE SURG 11 STRL SS (BLADE) ×2 IMPLANT
CATH ROBINSON RED A/P 16FR (CATHETERS) ×2 IMPLANT
CLOTH BEACON ORANGE TIMEOUT ST (SAFETY) ×2 IMPLANT
CONTAINER PREFILL 10% NBF 60ML (FORM) ×4 IMPLANT
COUNTER NEEDLE 1200 MAGNETIC (NEEDLE) ×1 IMPLANT
DECANTER SPIKE VIAL GLASS SM (MISCELLANEOUS) ×2 IMPLANT
ELECT REM PT RETURN 9FT ADLT (ELECTROSURGICAL) ×2
ELECTRODE REM PT RTRN 9FT ADLT (ELECTROSURGICAL) IMPLANT
GLOVE BIO SURGEON STRL SZ8.5 (GLOVE) ×4 IMPLANT
GOWN PREVENTION PLUS LG XLONG (DISPOSABLE) ×2 IMPLANT
GOWN PREVENTION PLUS XXLARGE (GOWN DISPOSABLE) ×2 IMPLANT
NDL SPNL 22GX3.5 QUINCKE BK (NEEDLE) ×1 IMPLANT
NEEDLE SPNL 22GX3.5 QUINCKE BK (NEEDLE) ×2 IMPLANT
NS IRRIG 1000ML POUR BTL (IV SOLUTION) ×2 IMPLANT
PACK VAGINAL MINOR WOMEN LF (CUSTOM PROCEDURE TRAY) ×2 IMPLANT
PAD PREP 24X48 CUFFED NSTRL (MISCELLANEOUS) ×2 IMPLANT
PENCIL BUTTON HOLSTER BLD 10FT (ELECTRODE) ×1 IMPLANT
SCOPETTES 8  STERILE (MISCELLANEOUS) ×2
SCOPETTES 8 STERILE (MISCELLANEOUS) ×2 IMPLANT
SUT CHROMIC 1 CT1 27 (SUTURE) ×5 IMPLANT
SYR CONTROL 10ML LL (SYRINGE) ×2 IMPLANT
TOWEL OR 17X24 6PK STRL BLUE (TOWEL DISPOSABLE) ×4 IMPLANT

## 2011-10-30 NOTE — Anesthesia Postprocedure Evaluation (Signed)
  Anesthesia Post-op Note  Patient: [name]  Procedure(s) Performed: Procedure(s) (LRB): CONIZATION CERVIX WITH BIOPSY (N/A) DILATATION AND CURETTAGE (N/A)  Patient Location: PACU  Anesthesia Type: General  Level of Consciousness: awake, alert  and oriented  Airway and Oxygen Therapy: Patient Spontanous Breathing  Post-op Pain: none  Post-op Assessment: Post-op Vital signs reviewed, Patient's Cardiovascular Status Stable, Respiratory Function Stable, Patent Airway, No signs of Nausea or vomiting, Adequate PO intake and Pain level controlled  Post-op Vital Signs: Reviewed and stable  Complications: No apparent anesthesia complications

## 2011-10-30 NOTE — Anesthesia Procedure Notes (Signed)
Procedure Name: LMA Insertion Date/Time: 10/30/2011 9:15 AM Performed by: Kendal Hymen Pre-anesthesia Checklist: Patient identified, Timeout performed, Emergency Drugs available, Suction available and Patient being monitored Patient Re-evaluated:Patient Re-evaluated prior to inductionOxygen Delivery Method: Circle system utilized Preoxygenation: Pre-oxygenation with 100% oxygen Intubation Type: IV induction Ventilation: Mask ventilation without difficulty LMA Size: 4.0 Grade View: Grade I Dental Injury: Teeth and Oropharynx as per pre-operative assessment

## 2011-10-30 NOTE — Discharge Instructions (Signed)
D&C Hysterosocpy °Care After °Read the instructions below. Refer to this sheet in the next few weeks. These instructions provide you with general information on caring for yourself after you leave the hospital. Your caregiver may also give you specific instructions.  °D&C or vacuum curettage is a minor operation. A D&C involves the stretching (dilatation) of the cervix and scraping (curettage) of the inside lining of the uterus. A vacuum curettage gently sucks out the lining and tissue in the uterus with a tube. You may have light cramping and bleeding for a couple of days to two weeks after the procedure. This procedure may be done in a hospital, outpatient clinic, or doctor's office. You may be given a drug to make you sleep (general anesthetic) or a drug that numbs the area (local anesthetic) in and around the cervix. °HOME CARE INSTRUCTIONS °· Do not drive for 24 hours.  °· Wait one week before returning to strenuous activities.  °· Take your temperature two times a day for 4 days and write it down. Provide these temperatures to your caregiver if they are abnormal (above 98.6° F or 37.0° C).  °· Avoid long periods of standing, and do no heavy lifting (more than 10 pounds), pushing or pulling.  °· Limit stair climbing to once or twice a day.  °· Take rest periods often.  °· You may resume your usual diet.  °· Drink plenty of fluids (6-8 glasses a day).  °· You should return to your usual bowel function. If constipation should occur, you may:  °· Take a mild laxative with permission from your caregiver.  °· Add fruit and bran to your diet.  °· Drink more fluids. This helps with constipation.  °· Take showers instead of baths until your caregiver gives you permission to take baths.  °· Do not go swimming or use a hot tub until your caregiver gives you permission.  °· Try to have someone with you or available for you the first 24 to 48 hours, especially if you had a general anesthetic.  °· Do not douche, use  tampons, or have intercourse until after your follow-up appointment, or when your caregiver approves.  °· Only take over-the-counter or prescription medicines for pain, discomfort, or fever as directed by your caregiver. Do not take aspirin. It can cause bleeding.  °· If a prescription was given, follow your caregiver's directions. You may be given a medicine that kills germs (antibiotic) to prevent an infection.  °· Keep all your follow-up appointments recommended by your caregiver.  °SEEK MEDICAL CARE IF: °· You have increasing cramps or pain not relieved with medication.  °· You develop belly (abdominal) pain which does not seem to be related to the same area of earlier cramping and pain.  °· You feel dizzy or feel like fainting.  °· You have bad smelling vaginal discharge.  °· You develop a rash.  °· You develop a reaction or allergy to your medication.  °SEEK IMMEDIATE MEDICAL CARE IF: °· Bleeding is heavier than a normal menstrual period.  °· You have an oral temperature above 100.6, not controlled by medicine.  °· You develop chest pain.  °· You develop shortness of breath.  °· You pass out.  °· You develop pain in your shoulder strap area.  °· You develop heavy vaginal bleeding with or without blood clots.  °MAKE SURE YOU:  °· Understand these instructions.  °· Will watch your condition.  °· Will get help right away if you are   not doing well or get worse.  °UPDATED HEALTH PRACTICES °· A PAP smear is done to screen for cervical cancer.  °· The first PAP smear should be done at age 21.  °· Between ages 21 and 29, PAP smears are repeated every 2 years.  °· Beginning at age 30, you are advised to have a PAP smear every 3 years as long as your past 3 PAP smears have been normal.  °· Some women have medical problems that increase the chance of getting cervical cancer. Talk to your caregiver about these problems. It is especially important to talk to your caregiver if a new problem develops soon after your last PAP  smear. In these cases, your caregiver may recommend more frequent screening and Pap smears.  °· The above recommendations are the same for women who have or have not gotten the vaccine for HPV (Human Papillomavirus).  °· If you had a hysterectomy for a problem that was not a cancer or a condition that could lead to cancer, then you no longer need Pap smears.  °· If you are between ages 65 and 70, and you have had normal Pap smears going back 10 years, you no longer need Pap smears.  °· If you have had past treatment for cervical cancer or a condition that could lead to cancer, you need Pap smears and screening for cancer for at least 20 years after your treatment.  °· Continue monthly self-breast examinations. Your caregiver can provide information and instructions for self-breast examination.  °Document Released: 05/09/2000 Document Re-Released: 10/30/2009 °[employer]® Patient Information ©2011 [employer]. °

## 2011-10-30 NOTE — H&P (Signed)
  There has been no change in this patient's history or physical since the time of dictation

## 2011-10-30 NOTE — Anesthesia Preprocedure Evaluation (Signed)
Anesthesia Evaluation  Patient identified by MRN, date of birth, ID band Patient awake    Reviewed: Allergy & Precautions, H&P , NPO status , Patient's Chart, lab work & pertinent test results  Airway Mallampati: III TM Distance: >3 FB Neck ROM: full    Dental No notable dental hx.    Pulmonary neg pulmonary ROS,  breath sounds clear to auscultation  Pulmonary exam normal       Cardiovascular negative cardio ROS  Rhythm:regular Rate:Normal     Neuro/Psych  Headaches, PSYCHIATRIC DISORDERS Depression    GI/Hepatic negative GI ROS, Neg liver ROS,   Endo/Other  Morbid obesity  Renal/GU negative Renal ROS  negative genitourinary   Musculoskeletal   Abdominal Normal abdominal exam  (+) + obese,   Peds  Hematology negative hematology ROS (+) Blood dyscrasia, anemia ,   Anesthesia Other Findings   Reproductive/Obstetrics negative OB ROS                           Anesthesia Physical Anesthesia Plan  ASA: III  Anesthesia Plan: General LMA   Post-op Pain Management:    Induction:   Airway Management Planned:   Additional Equipment:   Intra-op Plan:   Post-operative Plan:   Informed Consent: I have reviewed the patients History and Physical, chart, labs and discussed the procedure including the risks, benefits and alternatives for the proposed anesthesia with the patient or authorized representative who has indicated his/her understanding and acceptance.   Dental Advisory Given  Plan Discussed with: Anesthesiologist, CRNA and Surgeon  Anesthesia Plan Comments:         Anesthesia Quick Evaluation

## 2011-10-30 NOTE — Transfer of Care (Signed)
Immediate Anesthesia Transfer of Care Note  Patient: [name]  Procedure(s) Performed: Procedure(s) (LRB): CONIZATION CERVIX WITH BIOPSY (N/A) DILATATION AND CURETTAGE (N/A)  Patient Location: PACU  Anesthesia Type: General  Level of Consciousness: awake, alert  and oriented  Airway & Oxygen Therapy: Patient Spontanous Breathing and Patient connected to nasal cannula oxygen  Post-op Assessment: Report given to PACU RN and Post -op Vital signs reviewed and stable  Post vital signs: Reviewed and stable  Complications: No apparent anesthesia complications

## 2011-10-30 NOTE — Op Note (Addendum)
preop diagnosis dysfunctional uterine bleeding  severe dysplasia of the cervix post Postop diagnosis the same Procedure:  Cold knife cone and D&C Anesthesia Gen. Procedure on the general anesthesia patient in the lithotomy position perineum and vagina prepped and draped and bladder into the straight catheter bimanual exam revealed uterus of top normal size and there was no prolapse A weighted speculum was placed in the vagina and 0 chromic suture used for hemostasis and correct lap and 9:00 on the lateral aspects of the cervix cold knife cone was done in the usual manner and hemostasis was achieved with use sutures placed around the cervix the cervix and the endometrial cavity was sounded to 11 cm cervix dilated to #23 Shawnie Pons and a sharp curettage performed a large amount of tissue was obtained hemostasis was satisfactory patient tolerated procedure well taken to recovery room in good condition

## 2011-10-31 ENCOUNTER — Encounter (HOSPITAL_COMMUNITY): Payer: Self-pay | Admitting: Obstetrics

## 2012-02-03 ENCOUNTER — Other Ambulatory Visit: Payer: Self-pay | Admitting: Obstetrics

## 2012-02-15 ENCOUNTER — Encounter (HOSPITAL_COMMUNITY): Payer: Self-pay | Admitting: Pharmacist

## 2012-02-25 ENCOUNTER — Ambulatory Visit (HOSPITAL_COMMUNITY): Admission: RE | Admit: 2012-02-25 | Payer: Medicare Other | Source: Ambulatory Visit | Admitting: Obstetrics

## 2012-02-25 ENCOUNTER — Encounter (HOSPITAL_COMMUNITY): Admission: RE | Payer: Self-pay | Source: Ambulatory Visit

## 2012-02-25 SURGERY — ABLATION, ENDOMETRIUM, HYSTEROSCOPIC
Anesthesia: General

## 2012-04-08 ENCOUNTER — Emergency Department (HOSPITAL_COMMUNITY): Payer: PRIVATE HEALTH INSURANCE

## 2012-04-08 ENCOUNTER — Emergency Department (HOSPITAL_COMMUNITY)
Admission: EM | Admit: 2012-04-08 | Discharge: 2012-04-08 | Disposition: A | Discharge status: Home / Self Care | Payer: PRIVATE HEALTH INSURANCE | Attending: Emergency Medicine | Admitting: Emergency Medicine

## 2012-04-08 ENCOUNTER — Encounter (HOSPITAL_COMMUNITY): Payer: Self-pay | Admitting: Emergency Medicine

## 2012-04-08 DIAGNOSIS — G43909 Migraine, unspecified, not intractable, without status migrainosus: Secondary | ICD-10-CM | POA: Insufficient documentation

## 2012-04-08 DIAGNOSIS — M549 Dorsalgia, unspecified: Secondary | ICD-10-CM

## 2012-04-08 DIAGNOSIS — F329 Major depressive disorder, single episode, unspecified: Secondary | ICD-10-CM | POA: Insufficient documentation

## 2012-04-08 DIAGNOSIS — Z862 Personal history of diseases of the blood and blood-forming organs and certain disorders involving the immune mechanism: Secondary | ICD-10-CM | POA: Insufficient documentation

## 2012-04-08 DIAGNOSIS — Y9389 Activity, other specified: Secondary | ICD-10-CM | POA: Insufficient documentation

## 2012-04-08 DIAGNOSIS — E669 Obesity, unspecified: Secondary | ICD-10-CM | POA: Insufficient documentation

## 2012-04-08 DIAGNOSIS — H539 Unspecified visual disturbance: Secondary | ICD-10-CM

## 2012-04-08 DIAGNOSIS — F3289 Other specified depressive episodes: Secondary | ICD-10-CM | POA: Insufficient documentation

## 2012-04-08 DIAGNOSIS — IMO0002 Reserved for concepts with insufficient information to code with codable children: Secondary | ICD-10-CM | POA: Insufficient documentation

## 2012-04-08 MED ORDER — HYDROCODONE-ACETAMINOPHEN 5-325 MG PO TABS: 1.0000 | ORAL_TABLET | ORAL | Status: DC | PRN

## 2012-04-08 MED ORDER — DIAZEPAM 5 MG PO TABS: 5.0000 mg | ORAL_TABLET | Freq: Three times a day (TID) | ORAL | Status: DC | PRN

## 2012-04-08 MED ORDER — IBUPROFEN 800 MG PO TABS: 800.0000 mg | ORAL_TABLET | Freq: Three times a day (TID) | ORAL | Status: DC | PRN
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
## 2012-04-08 NOTE — ED Notes (Signed)
Pt verbalizes understnding 

## 2012-04-13 NOTE — ED Provider Notes (Signed)
Medical screening examination/treatment/procedure(s) were performed by non-physician practitioner and as supervising physician I was immediately available for consultation/collaboration.    Kemani Heidel M Elfa Wooton, MD 04/13/12 0823 

## 2012-12-09 DIAGNOSIS — F32A Depression, unspecified: Secondary | ICD-10-CM | POA: Insufficient documentation

## 2013-01-28 ENCOUNTER — Telehealth: Payer: Self-pay | Admitting: Internal Medicine

## 2013-01-28 ENCOUNTER — Telehealth: Payer: Self-pay

## 2013-01-28 NOTE — Telephone Encounter (Signed)
S/P PT IN REF TO NP APPT. ON 02/16/13@1 :30 REFERRING DR RICHTER DX-SEVERE IRON DEFICIENCY ANEMIA MAILED NP PACKET

## 2013-01-28 NOTE — Telephone Encounter (Signed)
C/D 01/28/13 for appt. 02/16/13

## 2013-02-07 ENCOUNTER — Encounter (HOSPITAL_COMMUNITY): Payer: Self-pay | Admitting: Family Medicine

## 2013-02-07 ENCOUNTER — Emergency Department (HOSPITAL_COMMUNITY)
Admission: EM | Admit: 2013-02-07 | Discharge: 2013-02-08 | Discharge status: Against Medical Advice | Payer: No Typology Code available for payment source | Attending: Emergency Medicine | Admitting: Emergency Medicine

## 2013-02-07 DIAGNOSIS — IMO0002 Reserved for concepts with insufficient information to code with codable children: Secondary | ICD-10-CM | POA: Insufficient documentation

## 2013-02-07 DIAGNOSIS — Z79899 Other long term (current) drug therapy: Secondary | ICD-10-CM | POA: Insufficient documentation

## 2013-02-07 DIAGNOSIS — M25561 Pain in right knee: Secondary | ICD-10-CM

## 2013-02-07 DIAGNOSIS — F329 Major depressive disorder, single episode, unspecified: Secondary | ICD-10-CM | POA: Insufficient documentation

## 2013-02-07 DIAGNOSIS — E669 Obesity, unspecified: Secondary | ICD-10-CM | POA: Insufficient documentation

## 2013-02-07 DIAGNOSIS — M25562 Pain in left knee: Secondary | ICD-10-CM

## 2013-02-07 DIAGNOSIS — Y9389 Activity, other specified: Secondary | ICD-10-CM | POA: Insufficient documentation

## 2013-02-07 DIAGNOSIS — F3289 Other specified depressive episodes: Secondary | ICD-10-CM | POA: Insufficient documentation

## 2013-02-07 DIAGNOSIS — S0990XA Unspecified injury of head, initial encounter: Secondary | ICD-10-CM | POA: Insufficient documentation

## 2013-02-07 DIAGNOSIS — F32A Depression, unspecified: Secondary | ICD-10-CM

## 2013-02-07 DIAGNOSIS — S39012A Strain of muscle, fascia and tendon of lower back, initial encounter: Secondary | ICD-10-CM

## 2013-02-07 DIAGNOSIS — Y92410 Unspecified street and highway as the place of occurrence of the external cause: Secondary | ICD-10-CM | POA: Insufficient documentation

## 2013-02-07 DIAGNOSIS — Z862 Personal history of diseases of the blood and blood-forming organs and certain disorders involving the immune mechanism: Secondary | ICD-10-CM | POA: Insufficient documentation

## 2013-02-07 DIAGNOSIS — S8990XA Unspecified injury of unspecified lower leg, initial encounter: Secondary | ICD-10-CM | POA: Insufficient documentation

## 2013-02-07 DIAGNOSIS — G43909 Migraine, unspecified, not intractable, without status migrainosus: Secondary | ICD-10-CM | POA: Insufficient documentation

## 2013-02-07 DIAGNOSIS — G8929 Other chronic pain: Secondary | ICD-10-CM | POA: Insufficient documentation
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
## 2013-02-08 ENCOUNTER — Emergency Department (HOSPITAL_COMMUNITY): Payer: No Typology Code available for payment source

## 2013-02-08 ENCOUNTER — Emergency Department (HOSPITAL_COMMUNITY)
Admission: EM | Admit: 2013-02-08 | Discharge: 2013-02-08 | Disposition: A | Discharge status: Home / Self Care | Payer: No Typology Code available for payment source | Attending: Emergency Medicine | Admitting: Emergency Medicine

## 2013-02-08 ENCOUNTER — Encounter (HOSPITAL_COMMUNITY): Payer: Self-pay | Admitting: Emergency Medicine

## 2013-02-08 DIAGNOSIS — S0990XA Unspecified injury of head, initial encounter: Secondary | ICD-10-CM | POA: Insufficient documentation

## 2013-02-08 DIAGNOSIS — IMO0002 Reserved for concepts with insufficient information to code with codable children: Secondary | ICD-10-CM | POA: Insufficient documentation

## 2013-02-08 DIAGNOSIS — Z862 Personal history of diseases of the blood and blood-forming organs and certain disorders involving the immune mechanism: Secondary | ICD-10-CM | POA: Insufficient documentation

## 2013-02-08 DIAGNOSIS — Z79899 Other long term (current) drug therapy: Secondary | ICD-10-CM | POA: Insufficient documentation

## 2013-02-08 DIAGNOSIS — E669 Obesity, unspecified: Secondary | ICD-10-CM | POA: Insufficient documentation

## 2013-02-08 DIAGNOSIS — F329 Major depressive disorder, single episode, unspecified: Secondary | ICD-10-CM | POA: Insufficient documentation

## 2013-02-08 DIAGNOSIS — F3289 Other specified depressive episodes: Secondary | ICD-10-CM | POA: Insufficient documentation

## 2013-02-08 DIAGNOSIS — Y9389 Activity, other specified: Secondary | ICD-10-CM | POA: Insufficient documentation

## 2013-02-08 DIAGNOSIS — Y92410 Unspecified street and highway as the place of occurrence of the external cause: Secondary | ICD-10-CM | POA: Insufficient documentation

## 2013-02-08 DIAGNOSIS — S8990XA Unspecified injury of unspecified lower leg, initial encounter: Secondary | ICD-10-CM | POA: Insufficient documentation

## 2013-02-08 DIAGNOSIS — R112 Nausea with vomiting, unspecified: Secondary | ICD-10-CM | POA: Insufficient documentation

## 2013-02-08 DIAGNOSIS — S3981XA Other specified injuries of abdomen, initial encounter: Secondary | ICD-10-CM | POA: Insufficient documentation

## 2013-02-08 DIAGNOSIS — H53149 Visual discomfort, unspecified: Secondary | ICD-10-CM | POA: Insufficient documentation

## 2013-02-08 DIAGNOSIS — G43909 Migraine, unspecified, not intractable, without status migrainosus: Secondary | ICD-10-CM | POA: Insufficient documentation

## 2013-02-08 DIAGNOSIS — R42 Dizziness and giddiness: Secondary | ICD-10-CM | POA: Insufficient documentation

## 2013-02-08 MED ORDER — ONDANSETRON HCL 4 MG PO TABS: 4.0000 mg | ORAL_TABLET | Freq: Four times a day (QID) | ORAL | Status: DC

## 2013-02-08 MED ORDER — ACETAMINOPHEN 500 MG PO TABS: 500.0000 mg | ORAL_TABLET | Freq: Four times a day (QID) | ORAL | Status: DC | PRN

## 2013-02-08 MED ORDER — ONDANSETRON 4 MG PO TBDP
8.0000 mg | ORAL_TABLET | Freq: Once | ORAL | Status: AC
  Administered 2013-02-08: 8 mg via ORAL
  Filled 2013-02-08: qty 2

## 2013-02-08 MED ORDER — HYDROCODONE-ACETAMINOPHEN 5-325 MG PO TABS: 2.0000 | ORAL_TABLET | Freq: Four times a day (QID) | ORAL | Status: DC | PRN

## 2013-02-08 MED ORDER — KETOROLAC TROMETHAMINE 60 MG/2ML IM SOLN
60.0000 mg | Freq: Once | INTRAMUSCULAR | Status: AC
  Administered 2013-02-08: 60 mg via INTRAMUSCULAR
  Filled 2013-02-08: qty 2

## 2013-02-08 MED ORDER — HYDROCODONE-ACETAMINOPHEN 5-325 MG PO TABS
2.0000 | ORAL_TABLET | Freq: Once | ORAL | Status: AC
  Administered 2013-02-08: 2 via ORAL
  Filled 2013-02-08: qty 2

## 2013-02-08 MED ORDER — ONDANSETRON 4 MG PO TBDP
4.0000 mg | ORAL_TABLET | Freq: Once | ORAL | Status: AC
  Administered 2013-02-08: 4 mg via ORAL
  Filled 2013-02-08: qty 1

## 2013-02-08 NOTE — ED Notes (Signed)
Pt comfortable with d/c and f/u instructions. Prescriptions x2. 

## 2013-02-08 NOTE — ED Provider Notes (Signed)
Medical screening examination/treatment/procedure(s) were performed by non-physician practitioner and as supervising physician I was immediately available for consultation/collaboration.  Eriverto Byrnes R. Colleene Swarthout, MD 02/08/13 0711 
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
## 2013-02-08 NOTE — ED Notes (Signed)
Pt in pediatric waiting with family member
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
## 2013-02-09 NOTE — ED Provider Notes (Signed)
Medical screening examination/treatment/procedure(s) were conducted as a shared visit with non-physician practitioner(s) and myself.  I personally evaluated the patient during the encounter   Joya Gaskins, MD 02/09/13 1146

## 2013-02-15 ENCOUNTER — Other Ambulatory Visit: Payer: Self-pay | Admitting: Internal Medicine

## 2013-02-15 DIAGNOSIS — D649 Anemia, unspecified: Secondary | ICD-10-CM

## 2013-02-16 ENCOUNTER — Ambulatory Visit: Payer: Medicare Other

## 2013-02-16 ENCOUNTER — Other Ambulatory Visit: Payer: Medicare Other | Admitting: Lab

## 2013-03-08 ENCOUNTER — Other Ambulatory Visit: Payer: Self-pay | Admitting: Internal Medicine

## 2013-03-08 DIAGNOSIS — N920 Excessive and frequent menstruation with regular cycle: Secondary | ICD-10-CM

## 2013-03-11 ENCOUNTER — Other Ambulatory Visit: Payer: Medicare Other

## 2013-03-16 ENCOUNTER — Ambulatory Visit
Admission: RE | Admit: 2013-03-16 | Discharge: 2013-03-16 | Disposition: A | Discharge status: Home / Self Care | Payer: Medicare Other | Source: Ambulatory Visit | Attending: Internal Medicine | Admitting: Internal Medicine

## 2013-03-16 DIAGNOSIS — N920 Excessive and frequent menstruation with regular cycle: Secondary | ICD-10-CM

## 2013-09-26 ENCOUNTER — Encounter (HOSPITAL_COMMUNITY): Payer: Self-pay | Admitting: Emergency Medicine

## 2013-09-26 DIAGNOSIS — G43909 Migraine, unspecified, not intractable, without status migrainosus: Secondary | ICD-10-CM | POA: Insufficient documentation

## 2013-09-26 MED ORDER — FENTANYL CITRATE 0.05 MG/ML IJ SOLN
50.0000 ug | Freq: Once | INTRAMUSCULAR | Status: AC
  Administered 2013-09-26: 50 ug via NASAL
  Filled 2013-09-26: qty 2

## 2013-09-26 NOTE — ED Notes (Signed)
Pillow given to pt. family's request.

## 2013-09-26 NOTE — ED Notes (Signed)
Pt. reports migraine headache with nausea, vomitting and photophobia .

## 2013-09-27 ENCOUNTER — Emergency Department (HOSPITAL_COMMUNITY)
Admission: EM | Admit: 2013-09-27 | Discharge: 2013-09-27 | Disposition: A | Discharge status: Home / Self Care | Payer: Medicare Other | Attending: Emergency Medicine | Admitting: Emergency Medicine

## 2013-10-26 ENCOUNTER — Emergency Department (HOSPITAL_COMMUNITY)
Admission: EM | Admit: 2013-10-26 | Discharge: 2013-10-26 | Disposition: A | Discharge status: Home / Self Care | Payer: Medicare Other | Attending: Emergency Medicine | Admitting: Emergency Medicine

## 2013-10-26 ENCOUNTER — Emergency Department (HOSPITAL_COMMUNITY): Payer: Medicare Other

## 2013-10-26 ENCOUNTER — Encounter (HOSPITAL_COMMUNITY): Payer: Self-pay | Admitting: Emergency Medicine

## 2013-10-26 DIAGNOSIS — G43909 Migraine, unspecified, not intractable, without status migrainosus: Secondary | ICD-10-CM | POA: Insufficient documentation

## 2013-10-26 DIAGNOSIS — R0789 Other chest pain: Secondary | ICD-10-CM

## 2013-10-26 DIAGNOSIS — F329 Major depressive disorder, single episode, unspecified: Secondary | ICD-10-CM | POA: Insufficient documentation

## 2013-10-26 DIAGNOSIS — F3289 Other specified depressive episodes: Secondary | ICD-10-CM | POA: Diagnosis not present

## 2013-10-26 DIAGNOSIS — M546 Pain in thoracic spine: Secondary | ICD-10-CM | POA: Diagnosis present

## 2013-10-26 DIAGNOSIS — Z862 Personal history of diseases of the blood and blood-forming organs and certain disorders involving the immune mechanism: Secondary | ICD-10-CM | POA: Insufficient documentation

## 2013-10-26 DIAGNOSIS — R071 Chest pain on breathing: Secondary | ICD-10-CM | POA: Diagnosis not present

## 2013-10-26 DIAGNOSIS — Z79899 Other long term (current) drug therapy: Secondary | ICD-10-CM | POA: Insufficient documentation

## 2013-10-26 DIAGNOSIS — E669 Obesity, unspecified: Secondary | ICD-10-CM | POA: Insufficient documentation

## 2013-10-26 LAB — URINALYSIS, ROUTINE W REFLEX MICROSCOPIC
Bilirubin Urine: NEGATIVE
GLUCOSE, UA: NEGATIVE mg/dL
Hgb urine dipstick: NEGATIVE
KETONES UR: NEGATIVE mg/dL
Nitrite: NEGATIVE
PH: 6 (ref 5.0–8.0)
Protein, ur: NEGATIVE mg/dL
Specific Gravity, Urine: 1.021 (ref 1.005–1.030)
Urobilinogen, UA: 1 mg/dL (ref 0.0–1.0)

## 2013-10-26 LAB — COMPREHENSIVE METABOLIC PANEL
ALK PHOS: 79 U/L (ref 39–117)
ALT: 12 U/L (ref 0–35)
AST: 19 U/L (ref 0–37)
Albumin: 3.2 g/dL — ABNORMAL LOW (ref 3.5–5.2)
BUN: 11 mg/dL (ref 6–23)
CO2: 25 meq/L (ref 19–32)
Calcium: 9 mg/dL (ref 8.4–10.5)
Chloride: 104 mEq/L (ref 96–112)
Creatinine, Ser: 0.69 mg/dL (ref 0.50–1.10)
GLUCOSE: 109 mg/dL — AB (ref 70–99)
POTASSIUM: 4.2 meq/L (ref 3.7–5.3)
SODIUM: 141 meq/L (ref 137–147)
Total Bilirubin: 0.4 mg/dL (ref 0.3–1.2)
Total Protein: 7.5 g/dL (ref 6.0–8.3)

## 2013-10-26 LAB — CBC WITH DIFFERENTIAL/PLATELET
BASOS PCT: 0 % (ref 0–1)
Basophils Absolute: 0 10*3/uL (ref 0.0–0.1)
EOS PCT: 1 % (ref 0–5)
Eosinophils Absolute: 0.1 10*3/uL (ref 0.0–0.7)
HCT: 27.9 % — ABNORMAL LOW (ref 36.0–46.0)
HEMOGLOBIN: 8.2 g/dL — AB (ref 12.0–15.0)
LYMPHS PCT: 33 % (ref 12–46)
Lymphs Abs: 3.1 10*3/uL (ref 0.7–4.0)
MCH: 15.2 pg — ABNORMAL LOW (ref 26.0–34.0)
MCHC: 29.4 g/dL — ABNORMAL LOW (ref 30.0–36.0)
MCV: 51.8 fL — ABNORMAL LOW (ref 78.0–100.0)
MONOS PCT: 9 % (ref 3–12)
Monocytes Absolute: 0.8 10*3/uL (ref 0.1–1.0)
NEUTROS PCT: 57 % (ref 43–77)
Neutro Abs: 5.3 10*3/uL (ref 1.7–7.7)
Platelets: 316 10*3/uL (ref 150–400)
RBC: 5.39 MIL/uL — AB (ref 3.87–5.11)
RDW: 21.7 % — ABNORMAL HIGH (ref 11.5–15.5)
WBC: 9.3 10*3/uL (ref 4.0–10.5)

## 2013-10-26 LAB — URINE MICROSCOPIC-ADD ON

## 2013-10-26 LAB — PREGNANCY, URINE: PREG TEST UR: NEGATIVE

## 2013-10-26 MED ORDER — HYDROCODONE-ACETAMINOPHEN 5-325 MG PO TABS: 1.0000 | ORAL_TABLET | ORAL | Status: DC | PRN

## 2013-10-26 MED ORDER — HYDROMORPHONE HCL PF 1 MG/ML IJ SOLN
1.0000 mg | Freq: Once | INTRAMUSCULAR | Status: AC
  Administered 2013-10-26: 1 mg via INTRAVENOUS
  Filled 2013-10-26: qty 1

## 2013-10-26 MED ORDER — CYCLOBENZAPRINE HCL 10 MG PO TABS: 10.0000 mg | ORAL_TABLET | Freq: Two times a day (BID) | ORAL | Status: DC | PRN

## 2013-10-26 MED ORDER — IBUPROFEN 800 MG PO TABS: 800.0000 mg | ORAL_TABLET | Freq: Three times a day (TID) | ORAL | Status: DC

## 2013-10-26 MED ORDER — ONDANSETRON HCL 4 MG/2ML IJ SOLN
4.0000 mg | Freq: Once | INTRAMUSCULAR | Status: AC
  Administered 2013-10-26: 4 mg via INTRAVENOUS
  Filled 2013-10-26: qty 2

## 2013-10-26 NOTE — ED Notes (Signed)
Pt screaming out in pain. Refused to discuss meds with [facility].

## 2013-10-26 NOTE — ED Notes (Addendum)
Pt c/o right mid back pain x 3 days, sts pain worsens with movement, feels better with massaging it. Reports at times she feels like she is having a muscles spasm. Denies dysuria/hematuria. Reports hx of chronic back pain but sts that it is usually in her lower back. sts she had a hard time sleeping last night d/t the pain. Nad, skin warm and dry, resp e/u.

## 2013-10-26 NOTE — Discharge Instructions (Signed)
Chest Wall Pain °Chest wall pain is pain felt in or around the chest bones and muscles. It may take up to 6 weeks to get better. It may take longer if you are active. Chest wall pain can happen on its own. Other times, things like germs, injury, coughing, or exercise can cause the pain. °HOME CARE  °· Avoid activities that make you tired or cause pain. Try not to use your chest, belly (abdominal), or side muscles. Do not use heavy weights. °· Put ice on the sore area. °· Put ice in a plastic bag. °· Place a towel between your skin and the bag. °· Leave the ice on for 15-20 minutes for the first 2 days. °· Only take medicine as told by your doctor. °GET HELP RIGHT AWAY IF:  °· You have more pain or are very uncomfortable. °· You have a fever. °· Your chest pain gets worse. °· You have new problems. °· You feel sick to your stomach (nauseous) or throw up (vomit). °· You start to sweat or feel lightheaded. °· You have a cough with mucus (phlegm). °· You cough up blood. °MAKE SURE YOU:  °· Understand these instructions. °· Will watch your condition. °· Will get help right away if you are not doing well or get worse. °Document Released: 10/29/2007 Document Revised: 08/04/2011 Document Reviewed: 01/06/2011 °[employer]® Patient Information ©2014 [employer]. °Heat Therapy °Heat therapy can help ease achy, tense, stiff, and tight muscles and joints. Heat should not be used on new injuries. Wait at least 48 hours after the injury before using heat therapy. Heat also should not be used for discomfort or pain that occurs right after doing an activity. If you still have pain or stiffness 3 hours after finishing the activity, then heat therapy may be used. °PRECAUTIONS  °High heat or prolonged exposure to heat can cause burns. Be careful when using heat therapy to avoid burning your skin. If you have any of the following conditions, do not use heat until you have discussed heat therapy with your caregiver: °· Poor  circulation. °· Healing wounds or scarred skin in the area being treated. °· Diabetes, heart disease, or high blood pressure. °· Numbness of the area being treated. °· Unusual swelling of the area being treated. °· Active infections. °· Blood clots. °· Cancer. °· Inability to communicate your response to pain. This can include young children and people with dementia. °HOME CARE INSTRUCTIONS °Moist heat pack °· Soak a clean towel in warm water, and squeeze out the extra water. The water temperature should be comfortable to the skin. °· Put the warm, wet towel in a plastic bag. °· Place a thin, dry towel between your skin and the bag. °· Put the heat pack on the area for 5 minutes, and check your skin. Your skin may be pink, but it should not be red. °· Leave the heat pack on the area for a total of 15 to 30 minutes. °· Repeat this every 2 to 4 hours while awake. Do not use heat while you are sleeping. °Warm water bath °· Fill a tub with warm water. The water temperature should be comfortable to the skin. °· Place the affected body part in the tub. °· Soak the area for 20 to 40 minutes. °· Repeat as needed. °Hot water bottle °· Fill the water bottle half full with hot water. °· Press out the extra air. Close the cap tightly. °· Place a dry towel between your skin and   the bottle.  Put the bottle on the area for 5 minutes, and check your skin. Your skin may be pink, but it should not be red.  Leave the bottle on the area for a total of 15 to 30 minutes.  Repeat this every 2 to 4 hours while awake. Electric heating pad  Place a dry towel between your skin and the heating pad.  Set the heating pad on low heat.  Put the heating pad on the area for 10 minutes, and check your skin. Your skin may be pink, but it should not be red.  Leave the heating pad on the area for a total of 20 to 40 minutes.  Repeat this every 2 to 4 hours while awake.  Do not lie on the heating pad.  Do not fall asleep while using  the heating pad.  Do not use the heating pad near water. Contact with water can result in an electrical shock. SEEK MEDICAL CARE IF:  You have blisters, redness, swelling, or numbness.  You have any new problems.  Your problems are getting worse.  You have any questions or concerns. If you develop any problems, stop using heat therapy until you see your caregiver. MAKE SURE YOU:  Understand these instructions.  Will watch your condition.  Will get help right away if you are not doing well or get worse. Document Released: 08/04/2011 Document Reviewed: 08/04/2011 [facility] Patient Information 2014 [place], [place].

## 2013-10-26 NOTE — ED Provider Notes (Signed)
CSN: [id number]     Arrival date & time 10/26/13  0758 History   First MD Initiated Contact with Patient 10/26/13 [phone]     Chief Complaint  Patient presents with  . Back Pain     (Consider location/radiation/quality/duration/timing/severity/associated sxs/prior Treatment) Patient is a 38 y.o. female presenting with back pain. The history is provided by the patient. No language interpreter was used.  Back Pain Location:  Thoracic spine Associated symptoms: chest pain   Associated symptoms: no abdominal pain, no dysuria and no fever   Associated symptoms comment:  Right lateral thoracic/chest pain for the past 3 days. No cough or fever. The pain is progressive. No nausea or vomiting. No dysuria or hematuria. She reports there is worse pain with deep breathing but denies SOB.    Past Medical History  Diagnosis Date  . Depression   . Back pain   . Obesity   . Migraines   . Anemia    Past Surgical History  Procedure Laterality Date  . Tubal ligation  2005  . Cervical conization w/bx  10/30/2011    Procedure: CONIZATION CERVIX WITH BIOPSY;  Surgeon: Kathreen CosierBernard A Marshall, MD;  Location: [facility];  Service: Gynecology;  Laterality: N/A;  . Dilation and curettage of uterus  10/30/2011    Procedure: DILATATION AND CURETTAGE;  Surgeon: Kathreen CosierBernard A Marshall, MD;  Location: [facility];  Service: Gynecology;  Laterality: N/A;   Family History  Problem Relation Age of Onset  . Lupus Sister   . Diabetes type II    . Stroke     History  Substance Use Topics  . Smoking status: Never Smoker   . Smokeless tobacco: Never Used  . Alcohol Use: No   OB History   Grav Para Term Preterm Abortions TAB SAB Ect Mult Living                 Review of Systems  Constitutional: Negative for fever.  Respiratory: Negative for cough and shortness of breath.   Cardiovascular: Positive for chest pain. Negative for leg swelling.  Gastrointestinal: Negative for nausea, vomiting and abdominal pain.  Genitourinary:  Negative for dysuria, hematuria and flank pain.  Musculoskeletal: Positive for back pain.      Allergies  Buspar  Home Medications   Prior to Admission medications   Medication Sig Start Date End Date Taking? Authorizing Provider  acetaminophen (TYLENOL) 500 MG tablet Take 1 tablet (500 mg total) by mouth every 6 (six) hours as needed for pain. 02/08/13   Junius FinnerErin O'Malley, PA-C  amphetamine-dextroamphetamine (ADDERALL) 30 MG tablet Take 30 mg by mouth 2 (two) times daily.    Historical Provider, MD  diclofenac (VOLTAREN) 75 MG EC tablet Take 75 mg by mouth 2 (two) times daily.    Historical Provider, MD  HYDROcodone-acetaminophen (NORCO/VICODIN) 5-325 MG per tablet Take 2 tablets by mouth every 6 (six) hours as needed for pain. 02/08/13   Mora BellmanHannah S Merrell, PA-C  ibuprofen (ADVIL,MOTRIN) 800 MG tablet Take 1 tablet (800 mg total) by mouth every 8 (eight) hours as needed for pain. 04/08/12   Trixie DredgeEmily West, PA-C  mirtazapine (REMERON) 15 MG tablet Take 15 mg by mouth at bedtime.    Historical Provider, MD  ondansetron (ZOFRAN) 4 MG tablet Take 1 tablet (4 mg total) by mouth every 6 (six) hours. 02/08/13   Mora BellmanHannah S Merrell, PA-C  oxyCODONE-acetaminophen (PERCOCET) 10-325 MG per tablet Take 1 tablet by mouth every 8 (eight) hours as needed. For pain.  Historical Provider, MD  SUMAtriptan (IMITREX) 100 MG tablet Take 100 mg by mouth every 2 (two) hours as needed for migraine. May repeat in 2 hours if headache persists or recurs.    Historical Provider, MD  topiramate (TOPAMAX) 25 MG tablet Take 25 mg by mouth 2 (two) times daily.    Historical Provider, MD   BP 128/71  Pulse 86  Temp(Src) 97.8 F (36.6 C) (Oral)  Resp 20  Ht 5\' 6"  (1.676 m)  Wt 298 lb (135.172 kg)  BMI 48.12 kg/m2  SpO2 100%  LMP 10/19/2013 Physical Exam  Constitutional: She is oriented to person, place, and time. She appears well-developed and well-nourished.  HENT:  Head: Normocephalic.  Neck: Normal range of motion. Neck  supple.  Cardiovascular: Normal rate and regular rhythm.   Pulmonary/Chest: Effort normal and breath sounds normal. She exhibits tenderness.  Right lateral wall tenderness to palpation.  Abdominal: Soft. Bowel sounds are normal. There is no tenderness. There is no rebound and no guarding.  Musculoskeletal: Normal range of motion.  Neurological: She is alert and oriented to person, place, and time.  Skin: Skin is warm and dry. No rash noted.  Psychiatric: She has a normal mood and affect.    ED Course  Procedures (including critical care time) Labs Review Labs Reviewed  URINALYSIS, ROUTINE W REFLEX MICROSCOPIC  PREGNANCY, URINE  CBC WITH DIFFERENTIAL  COMPREHENSIVE METABOLIC PANEL   Results for orders placed during the hospital encounter of 10/26/13  URINALYSIS, ROUTINE W REFLEX MICROSCOPIC      Result Value Ref Range   Color, Urine YELLOW  YELLOW   APPearance CLEAR  CLEAR   Specific Gravity, Urine 1.021  1.005 - 1.030   pH 6.0  5.0 - 8.0   Glucose, UA NEGATIVE  NEGATIVE mg/dL   Hgb urine dipstick NEGATIVE  NEGATIVE   Bilirubin Urine NEGATIVE  NEGATIVE   Ketones, ur NEGATIVE  NEGATIVE mg/dL   Protein, ur NEGATIVE  NEGATIVE mg/dL   Urobilinogen, UA 1.0  0.0 - 1.0 mg/dL   Nitrite NEGATIVE  NEGATIVE   Leukocytes, UA TRACE (*) NEGATIVE  PREGNANCY, URINE      Result Value Ref Range   Preg Test, Ur NEGATIVE  NEGATIVE  CBC WITH DIFFERENTIAL      Result Value Ref Range   WBC 9.3  4.0 - 10.5 K/uL   RBC 5.39 (*) 3.87 - 5.11 MIL/uL   Hemoglobin 8.2 (*) 12.0 - 15.0 g/dL   HCT 19.1 (*) 47.8 - 29.5 %   MCV 51.8 (*) 78.0 - 100.0 fL   MCH 15.2 (*) 26.0 - 34.0 pg   MCHC 29.4 (*) 30.0 - 36.0 g/dL   RDW 62.1 (*) 30.8 - 65.7 %   Platelets 316  150 - 400 K/uL   Neutrophils Relative % 57  43 - 77 %   Lymphocytes Relative 33  12 - 46 %   Monocytes Relative 9  3 - 12 %   Eosinophils Relative 1  0 - 5 %   Basophils Relative 0  0 - 1 %   Neutro Abs 5.3  1.7 - 7.7 K/uL   Lymphs Abs 3.1   0.7 - 4.0 K/uL   Monocytes Absolute 0.8  0.1 - 1.0 K/uL   Eosinophils Absolute 0.1  0.0 - 0.7 K/uL   Basophils Absolute 0.0  0.0 - 0.1 K/uL   RBC Morphology ELLIPTOCYTES    COMPREHENSIVE METABOLIC PANEL      Result Value Ref Range  Sodium 141  137 - 147 mEq/L   Potassium 4.2  3.7 - 5.3 mEq/L   Chloride 104  96 - 112 mEq/L   CO2 25  19 - 32 mEq/L   Glucose, Bld 109 (*) 70 - 99 mg/dL   BUN 11  6 - 23 mg/dL   Creatinine, Ser 1.61  0.50 - 1.10 mg/dL   Calcium 9.0  8.4 - 09.6 mg/dL   Total Protein 7.5  6.0 - 8.3 g/dL   Albumin 3.2 (*) 3.5 - 5.2 g/dL   AST 19  0 - 37 U/L   ALT 12  0 - 35 U/L   Alkaline Phosphatase 79  39 - 117 U/L   Total Bilirubin 0.4  0.3 - 1.2 mg/dL   GFR calc non Af Amer >90  >90 mL/min   GFR calc Af Amer >90  >90 mL/min  URINE MICROSCOPIC-ADD ON      Result Value Ref Range   Squamous Epithelial / LPF RARE  RARE   WBC, UA 0-2  <3 WBC/hpf   RBC / HPF 0-2  <3 RBC/hpf   Bacteria, UA RARE  RARE   Dg Chest 2 View  10/26/2013   CLINICAL DATA:  Right flank pain  EXAM: CHEST  2 VIEW  COMPARISON:  02/08/2013  FINDINGS: Lungs are clear.  No pleural effusion or pneumothorax.  The heart is normal in size.  Visualized osseous structures are within normal limits.  IMPRESSION: No evidence of acute cardiopulmonary disease.   Electronically Signed   By: Charline Bills M.D.   On: 10/26/2013 09:49    Imaging Review No results found.   EKG Interpretation None      MDM   Final diagnoses:  None    1. Chest wall pain  The patient is PERC negative leaving no suspicion of PE. All lab and imaging is negative. Pain is reproducible and worse with movement. Likely musculoskeletal in nature.     Arnoldo Hooker, PA-C 10/27/13 2128

## 2013-10-31 NOTE — ED Provider Notes (Signed)
Medical screening examination/treatment/procedure(s) were performed by non-physician practitioner and as supervising physician I was immediately available for consultation/collaboration.   EKG Interpretation None        William Adalai Perl, MD 10/31/13 2219 

## 2013-12-01 ENCOUNTER — Other Ambulatory Visit: Payer: Self-pay | Admitting: Obstetrics

## 2013-12-05 NOTE — H&P (Signed)
[name], [name]                ACCOUNT NO.:  [id number]  MEDICAL RECORD NO.:  [id number]  LOCATION:  [facility]                         FACILITY:  [facility]  PHYSICIAN:  Kathreen CosierBernard A. Shaylene Paganelli, M.D.DATE OF BIRTH:  1975/09/18  DATE OF ADMISSION:  12/01/2013 DATE OF DISCHARGE:                             HISTORY & PHYSICAL   The patient is a 38 year old, gravida 3, para 3-0-0-3 who has been having heavy periods with clots, sometimes 2 periods in a month and she will have hysteroscopy D and C, and HT ablation.  PAST MEDICAL HISTORY:  Abnormal Pap smear and her recent Pap showed atypical cells and it was discussed with her that she will have colposcopy after this present procedure.  PAST SURGICAL HISTORY:  She had a tubal ligation in the past.  SOCIAL HISTORY:  Negative.  SYSTEM REVIEW:  Negative.  PHYSICAL EXAMINATION:  GENERAL:  Well-developed obese female, in no distress. HEENT:  Negative. LUNGS:  Clear to P and A. HEART:  Regular rhythm.  No murmurs.  No gallops. BREAST:  No masses. ABDOMEN:  Obese.  Uterus normal size, negative adnexa.  External genitalia normal. EXTREMITIES:  Negative.          ______________________________ Kathreen CosierBernard A. Jamielyn Petrucci, M.D.     BAM/MEDQ  D:  12/05/2013  T:  12/05/2013  Job:  [id number]

## 2013-12-08 ENCOUNTER — Encounter (HOSPITAL_COMMUNITY): Payer: Self-pay | Admitting: *Deleted

## 2013-12-14 ENCOUNTER — Encounter (HOSPITAL_COMMUNITY)
Admission: RE | Disposition: A | Discharge status: Home / Self Care | Payer: Self-pay | Source: Ambulatory Visit | Attending: Obstetrics

## 2013-12-14 ENCOUNTER — Ambulatory Visit (HOSPITAL_COMMUNITY): Payer: Medicare Other | Admitting: Anesthesiology

## 2013-12-14 ENCOUNTER — Encounter (HOSPITAL_COMMUNITY): Payer: Medicare Other | Admitting: Anesthesiology

## 2013-12-14 ENCOUNTER — Ambulatory Visit (HOSPITAL_COMMUNITY)
Admission: RE | Admit: 2013-12-14 | Discharge: 2013-12-14 | Disposition: A | Discharge status: Home / Self Care | Payer: Medicare Other | Source: Ambulatory Visit | Attending: Obstetrics | Admitting: Obstetrics

## 2013-12-14 ENCOUNTER — Encounter (HOSPITAL_COMMUNITY): Payer: Self-pay

## 2013-12-14 DIAGNOSIS — Z9851 Tubal ligation status: Secondary | ICD-10-CM | POA: Insufficient documentation

## 2013-12-14 DIAGNOSIS — F319 Bipolar disorder, unspecified: Secondary | ICD-10-CM | POA: Diagnosis not present

## 2013-12-14 DIAGNOSIS — N938 Other specified abnormal uterine and vaginal bleeding: Secondary | ICD-10-CM | POA: Diagnosis present

## 2013-12-14 DIAGNOSIS — Z6841 Body Mass Index (BMI) 40.0 and over, adult: Secondary | ICD-10-CM | POA: Diagnosis not present

## 2013-12-14 DIAGNOSIS — M549 Dorsalgia, unspecified: Secondary | ICD-10-CM | POA: Diagnosis not present

## 2013-12-14 DIAGNOSIS — G8929 Other chronic pain: Secondary | ICD-10-CM | POA: Insufficient documentation

## 2013-12-14 DIAGNOSIS — F341 Dysthymic disorder: Secondary | ICD-10-CM | POA: Insufficient documentation

## 2013-12-14 DIAGNOSIS — R87619 Unspecified abnormal cytological findings in specimens from cervix uteri: Secondary | ICD-10-CM | POA: Diagnosis not present

## 2013-12-14 DIAGNOSIS — M129 Arthropathy, unspecified: Secondary | ICD-10-CM | POA: Diagnosis not present

## 2013-12-14 DIAGNOSIS — N949 Unspecified condition associated with female genital organs and menstrual cycle: Secondary | ICD-10-CM | POA: Insufficient documentation

## 2013-12-14 DIAGNOSIS — N920 Excessive and frequent menstruation with regular cycle: Secondary | ICD-10-CM | POA: Insufficient documentation

## 2013-12-14 DIAGNOSIS — D649 Anemia, unspecified: Secondary | ICD-10-CM | POA: Diagnosis not present

## 2013-12-14 HISTORY — DX: Bipolar disorder, unspecified: F31.9

## 2013-12-14 HISTORY — DX: Anxiety disorder, unspecified: F41.9

## 2013-12-14 HISTORY — PX: DILITATION & CURRETTAGE/HYSTROSCOPY WITH HYDROTHERMAL ABLATION: SHX5570

## 2013-12-14 LAB — CBC
HEMATOCRIT: 26.5 % — AB (ref 36.0–46.0)
Hemoglobin: 7.6 g/dL — ABNORMAL LOW (ref 12.0–15.0)
MCH: 15 pg — ABNORMAL LOW (ref 26.0–34.0)
MCHC: 28.7 g/dL — ABNORMAL LOW (ref 30.0–36.0)
MCV: 52.2 fL — ABNORMAL LOW (ref 78.0–100.0)
PLATELETS: 294 10*3/uL (ref 150–400)
RBC: 5.08 MIL/uL (ref 3.87–5.11)
RDW: 21.1 % — AB (ref 11.5–15.5)
WBC: 8.7 10*3/uL (ref 4.0–10.5)

## 2013-12-14 SURGERY — DILATATION & CURETTAGE/HYSTEROSCOPY WITH HYDROTHERMAL ABLATION
Anesthesia: General | Site: Vagina

## 2013-12-14 MED ORDER — DEXAMETHASONE SODIUM PHOSPHATE 10 MG/ML IJ SOLN
INTRAMUSCULAR | Status: DC | PRN
Start: 2013-12-14 — End: 2013-12-14
  Administered 2013-12-14: 5 mg via INTRAVENOUS

## 2013-12-14 MED ORDER — KETOROLAC TROMETHAMINE 30 MG/ML IJ SOLN
INTRAMUSCULAR | Status: AC
  Filled 2013-12-14: qty 1

## 2013-12-14 MED ORDER — FENTANYL CITRATE 0.05 MG/ML IJ SOLN
INTRAMUSCULAR | Status: AC
  Filled 2013-12-14: qty 2

## 2013-12-14 MED ORDER — LIDOCAINE HCL (CARDIAC) 20 MG/ML IV SOLN
INTRAVENOUS | Status: DC | PRN
  Administered 2013-12-14: 100 mg via INTRAVENOUS

## 2013-12-14 MED ORDER — MIDAZOLAM HCL 2 MG/2ML IJ SOLN
INTRAMUSCULAR | Status: AC
  Filled 2013-12-14: qty 2

## 2013-12-14 MED ORDER — LACTATED RINGERS IV SOLN
INTRAVENOUS | Status: DC
Start: 2013-12-14 — End: 2013-12-14
  Administered 2013-12-14: 11:00:00 via INTRAVENOUS
  Administered 2013-12-14: 10 mL/h via INTRAVENOUS

## 2013-12-14 MED ORDER — ONDANSETRON HCL 4 MG/2ML IJ SOLN
INTRAMUSCULAR | Status: DC | PRN
  Administered 2013-12-14: 4 mg via INTRAVENOUS

## 2013-12-14 MED ORDER — MIDAZOLAM HCL 2 MG/2ML IJ SOLN
INTRAMUSCULAR | Status: DC | PRN
  Administered 2013-12-14 (×2): 1 mg via INTRAVENOUS

## 2013-12-14 MED ORDER — DEXAMETHASONE SODIUM PHOSPHATE 10 MG/ML IJ SOLN
INTRAMUSCULAR | Status: AC
  Filled 2013-12-14: qty 1

## 2013-12-14 MED ORDER — ONDANSETRON HCL 4 MG/2ML IJ SOLN
INTRAMUSCULAR | Status: AC
  Filled 2013-12-14: qty 2

## 2013-12-14 MED ORDER — FENTANYL CITRATE 0.05 MG/ML IJ SOLN
25.0000 ug | INTRAMUSCULAR | Status: DC | PRN
  Administered 2013-12-14: 50 ug via INTRAVENOUS
  Administered 2013-12-14 (×2): 25 ug via INTRAVENOUS

## 2013-12-14 MED ORDER — FENTANYL CITRATE 0.05 MG/ML IJ SOLN
INTRAMUSCULAR | Status: DC | PRN
  Administered 2013-12-14: 25 ug via INTRAVENOUS
  Administered 2013-12-14 (×2): 50 ug via INTRAVENOUS
  Administered 2013-12-14: 25 ug via INTRAVENOUS

## 2013-12-14 MED ORDER — KETOROLAC TROMETHAMINE 30 MG/ML IJ SOLN: 15.0000 mg | Freq: Once | INTRAMUSCULAR | Status: DC | PRN

## 2013-12-14 MED ORDER — KETOROLAC TROMETHAMINE 30 MG/ML IJ SOLN
INTRAMUSCULAR | Status: DC | PRN
Start: 2013-12-14 — End: 2013-12-14
  Administered 2013-12-14: 30 mg via INTRAVENOUS

## 2013-12-14 MED ORDER — OXYCODONE-ACETAMINOPHEN 5-325 MG PO TABS
ORAL_TABLET | ORAL | Status: AC
  Filled 2013-12-14: qty 1

## 2013-12-14 MED ORDER — METOCLOPRAMIDE HCL 5 MG/ML IJ SOLN: 10.0000 mg | Freq: Once | INTRAMUSCULAR | Status: DC | PRN

## 2013-12-14 MED ORDER — MEPERIDINE HCL 25 MG/ML IJ SOLN: 6.2500 mg | INTRAMUSCULAR | Status: DC | PRN

## 2013-12-14 MED ORDER — FENTANYL CITRATE 0.05 MG/ML IJ SOLN
INTRAMUSCULAR | Status: AC
  Administered 2013-12-14: 50 ug via INTRAVENOUS
  Filled 2013-12-14: qty 2

## 2013-12-14 MED ORDER — SODIUM CHLORIDE 0.9 % IR SOLN
Status: DC | PRN
  Administered 2013-12-14: 3000 mL

## 2013-12-14 MED ORDER — LIDOCAINE HCL 1 % IJ SOLN
INTRAMUSCULAR | Status: DC | PRN
  Administered 2013-12-14: 10 mL

## 2013-12-14 MED ORDER — PROPOFOL 10 MG/ML IV EMUL
INTRAVENOUS | Status: AC
  Filled 2013-12-14: qty 20

## 2013-12-14 MED ORDER — LIDOCAINE HCL (CARDIAC) 20 MG/ML IV SOLN
INTRAVENOUS | Status: AC
  Filled 2013-12-14: qty 5

## 2013-12-14 MED ORDER — LIDOCAINE HCL 1 % IJ SOLN
INTRAMUSCULAR | Status: AC
  Filled 2013-12-14: qty 20

## 2013-12-14 MED ORDER — PROPOFOL 10 MG/ML IV BOLUS
INTRAVENOUS | Status: DC | PRN
  Administered 2013-12-14: 200 mg via INTRAVENOUS

## 2013-12-14 MED ORDER — OXYCODONE-ACETAMINOPHEN 5-325 MG PO TABS
1.0000 | ORAL_TABLET | ORAL | Status: DC | PRN
  Administered 2013-12-14: 1 via ORAL

## 2013-12-14 SURGICAL SUPPLY — 13 items
CATH ROBINSON RED A/P 16FR (CATHETERS) ×3 IMPLANT
CLOTH BEACON ORANGE TIMEOUT ST (SAFETY) ×3 IMPLANT
CONTAINER PREFILL 10% NBF 60ML (FORM) ×6 IMPLANT
DRAPE HYSTEROSCOPY (DRAPE) ×3 IMPLANT
DRSG TELFA 3X8 NADH (GAUZE/BANDAGES/DRESSINGS) ×3 IMPLANT
GLOVE BIO SURGEON STRL SZ8.5 (GLOVE) ×3 IMPLANT
GOWN STRL REUS W/TWL 2XL LVL3 (GOWN DISPOSABLE) ×3 IMPLANT
GOWN STRL REUS W/TWL LRG LVL3 (GOWN DISPOSABLE) ×3 IMPLANT
PACK VAGINAL MINOR WOMEN LF (CUSTOM PROCEDURE TRAY) ×3 IMPLANT
PAD DRESSING TELFA 3X8 NADH (GAUZE/BANDAGES/DRESSINGS) ×1 IMPLANT
PAD OB MATERNITY 4.3X12.25 (PERSONAL CARE ITEMS) ×3 IMPLANT
SET GENESYS HTA PROCERVA (MISCELLANEOUS) ×3 IMPLANT
TOWEL OR 17X24 6PK STRL BLUE (TOWEL DISPOSABLE) ×6 IMPLANT

## 2013-12-14 NOTE — Discharge Instructions (Signed)
D&C Hysterosocpy °Care After °Read the instructions below. Refer to this sheet in the next few weeks. These instructions provide you with general information on caring for yourself after you leave the hospital. Your caregiver may also give you specific instructions.  °D&C or vacuum curettage is a minor operation. A D&C involves the stretching (dilatation) of the cervix and scraping (curettage) of the inside lining of the uterus. A vacuum curettage gently sucks out the lining and tissue in the uterus with a tube. You may have light cramping and bleeding for a couple of days to two weeks after the procedure. This procedure may be done in a hospital, outpatient clinic, or doctor's office. You may be given a drug to make you sleep (general anesthetic) or a drug that numbs the area (local anesthetic) in and around the cervix. °HOME CARE INSTRUCTIONS °· Do not drive for 24 hours.  °· Wait one week before returning to strenuous activities.  °· Take your temperature two times a day for 4 days and write it down. Provide these temperatures to your caregiver if they are abnormal (above 98.6° F or 37.0° C).  °· Avoid long periods of standing, and do no heavy lifting (more than 10 pounds), pushing or pulling.  °· Limit stair climbing to once or twice a day.  °· Take rest periods often.  °· You may resume your usual diet.  °· Drink plenty of fluids (6-8 glasses a day).  °· You should return to your usual bowel function. If constipation should occur, you may:  °· Take a mild laxative with permission from your caregiver.  °· Add fruit and bran to your diet.  °· Drink more fluids. This helps with constipation.  °· Take showers instead of baths until your caregiver gives you permission to take baths.  °· Do not go swimming or use a hot tub until your caregiver gives you permission.  °· Try to have someone with you or available for you the first 24 to 48 hours, especially if you had a general anesthetic.  °· Do not douche, use  tampons, or have intercourse until after your follow-up appointment, or when your caregiver approves.  °· Only take over-the-counter or prescription medicines for pain, discomfort, or fever as directed by your caregiver. Do not take aspirin. It can cause bleeding.  °· If a prescription was given, follow your caregiver's directions. You may be given a medicine that kills germs (antibiotic) to prevent an infection.  °· Keep all your follow-up appointments recommended by your caregiver.  °SEEK MEDICAL CARE IF: °· You have increasing cramps or pain not relieved with medication.  °· You develop belly (abdominal) pain which does not seem to be related to the same area of earlier cramping and pain.  °· You feel dizzy or feel like fainting.  °· You have bad smelling vaginal discharge.  °· You develop a rash.  °· You develop a reaction or allergy to your medication.  °SEEK IMMEDIATE MEDICAL CARE IF: °· Bleeding is heavier than a normal menstrual period.  °· You have an oral temperature above 100.6, not controlled by medicine.  °· You develop chest pain.  °· You develop shortness of breath.  °· You pass out.  °· You develop pain in your shoulder strap area.  °· You develop heavy vaginal bleeding with or without blood clots.  °MAKE SURE YOU:  °· Understand these instructions.  °· Will watch your condition.  °· Will get help right away if you are   not doing well or get worse.  °UPDATED HEALTH PRACTICES °· A PAP smear is done to screen for cervical cancer.  °· The first PAP smear should be done at age 21.  °· Between ages 21 and 29, PAP smears are repeated every 2 years.  °· Beginning at age 30, you are advised to have a PAP smear every 3 years as long as your past 3 PAP smears have been normal.  °· Some women have medical problems that increase the chance of getting cervical cancer. Talk to your caregiver about these problems. It is especially important to talk to your caregiver if a new problem develops soon after your last PAP  smear. In these cases, your caregiver may recommend more frequent screening and Pap smears.  °· The above recommendations are the same for women who have or have not gotten the vaccine for HPV (Human Papillomavirus).  °· If you had a hysterectomy for a problem that was not a cancer or a condition that could lead to cancer, then you no longer need Pap smears.  °· If you are between ages 65 and 70, and you have had normal Pap smears going back 10 years, you no longer need Pap smears.  °· If you have had past treatment for cervical cancer or a condition that could lead to cancer, you need Pap smears and screening for cancer for at least 20 years after your treatment.  °· Continue monthly self-breast examinations. Your caregiver can provide information and instructions for self-breast examination.  °Document Released: 05/09/2000 Document Re-Released: 10/30/2009 °[employer]® Patient Information ©2011 [employer]. °

## 2013-12-14 NOTE — H&P (Signed)
  There has been no change in the history and physical since the original dictation 

## 2013-12-14 NOTE — Anesthesia Postprocedure Evaluation (Signed)
  Anesthesia Post-op Note  Patient: [name]  Procedure(s) Performed: Procedure(s): DILATATION & CURETTAGE/HYSTEROSCOPY WITH HYDROTHERMAL ABLATION (N/A) Patient is awake and responsive. Pain and nausea are reasonably well controlled. Vital signs are stable and clinically acceptable. Oxygen saturation is clinically acceptable. There are no apparent anesthetic complications at this time. Patient is ready for discharge.

## 2013-12-14 NOTE — Op Note (Signed)
Preop diagnosis dysfunctional uterine bleeding Postop diagnosis hysteroscopy D&C HTA ablation Anesthesia Gen. Surgeon Dr. Francoise CeoBernard Andrik Sandt Procedure  Under  general anesthesia patient in the lithotomy position perineum and vagina prepped and draped bladder emptied with a straight catheter bimanual exam revealed the uterus to be enlarged negative adnexa speculum placed in the vagina the cervix injected with  10 cc of 1% Xylocaine anterior lip of the cervix grasped with a tenaculum the endometrial cavity sounded to 11 cm the cervix dilated to #25 [name] and the hysteroscope inserted she had polyps and adhesions in the cavity and the procedure terminated a sharp curettage performed a large amount of tissue obtained then the HTA device inserted there was a  Fluid warm up  to 100C been 10 minutes ablation then two-minute cooldown and the procedure terminated patient tolerated the procedure well taken to the recovery room in good condition

## 2013-12-14 NOTE — Anesthesia Preprocedure Evaluation (Addendum)
Anesthesia Evaluation  Patient identified by MRN, date of birth, ID band Patient awake    Reviewed: Allergy & Precautions, H&P , NPO status , Patient's Chart, lab work & pertinent test results, reviewed documented beta blocker date and time   History of Anesthesia Complications (+) DIFFICULT IV STICK / SPECIAL LINE and history of anesthetic complications  Airway Mallampati: II TM Distance: >3 FB Neck ROM: full    Dental  (+) Teeth Intact   Pulmonary neg pulmonary ROS,  breath sounds clear to auscultation  Pulmonary exam normal       Cardiovascular negative cardio ROS  Rhythm:regular Rate:Normal     Neuro/Psych  Headaches, Anxiety Depression Bipolar Disorder Chronic back pain    GI/Hepatic negative GI ROS, Neg liver ROS,   Endo/Other  Morbid obesity  Renal/GU negative Renal ROS  Female GU complaint     Musculoskeletal  (+) Arthritis -,   Abdominal   Peds  Hematology  (+) anemia , Pt reports hgb of 7.6 is normal for her   Anesthesia Other Findings   Reproductive/Obstetrics negative OB ROS                          Anesthesia Physical Anesthesia Plan  ASA: III  Anesthesia Plan: General LMA   Post-op Pain Management:    Induction:   Airway Management Planned:   Additional Equipment:   Intra-op Plan:   Post-operative Plan:   Informed Consent: I have reviewed the patients History and Physical, chart, labs and discussed the procedure including the risks, benefits and alternatives for the proposed anesthesia with the patient or authorized representative who has indicated his/her understanding and acceptance.   Dental Advisory Given  Plan Discussed with: CRNA and Surgeon  Anesthesia Plan Comments:         Anesthesia Quick Evaluation

## 2013-12-14 NOTE — Transfer of Care (Signed)
Immediate Anesthesia Transfer of Care Note  Patient: [name]  Procedure(s) Performed: Procedure(s): DILATATION & CURETTAGE/HYSTEROSCOPY WITH HYDROTHERMAL ABLATION (N/A)  Patient Location: PACU  Anesthesia Type:General  Level of Consciousness: awake, alert , oriented and patient cooperative  Airway & Oxygen Therapy: Patient Spontanous Breathing and Patient connected to nasal cannula oxygen  Post-op Assessment: Report given to PACU RN and Post -op Vital signs reviewed and stable  Post vital signs: Reviewed and stable  Complications: No apparent anesthesia complications

## 2013-12-15 ENCOUNTER — Encounter (HOSPITAL_COMMUNITY): Payer: Self-pay | Admitting: Obstetrics

## 2014-05-27 DIAGNOSIS — G894 Chronic pain syndrome: Secondary | ICD-10-CM | POA: Diagnosis not present

## 2014-05-28 DIAGNOSIS — G894 Chronic pain syndrome: Secondary | ICD-10-CM | POA: Diagnosis not present

## 2014-05-29 DIAGNOSIS — G894 Chronic pain syndrome: Secondary | ICD-10-CM | POA: Diagnosis not present

## 2014-05-30 DIAGNOSIS — G894 Chronic pain syndrome: Secondary | ICD-10-CM | POA: Diagnosis not present

## 2014-05-31 DIAGNOSIS — G894 Chronic pain syndrome: Secondary | ICD-10-CM | POA: Diagnosis not present

## 2014-06-01 DIAGNOSIS — G894 Chronic pain syndrome: Secondary | ICD-10-CM | POA: Diagnosis not present

## 2014-06-02 DIAGNOSIS — G894 Chronic pain syndrome: Secondary | ICD-10-CM | POA: Diagnosis not present

## 2014-06-03 DIAGNOSIS — G894 Chronic pain syndrome: Secondary | ICD-10-CM | POA: Diagnosis not present

## 2014-06-04 DIAGNOSIS — G894 Chronic pain syndrome: Secondary | ICD-10-CM | POA: Diagnosis not present

## 2014-06-05 DIAGNOSIS — G894 Chronic pain syndrome: Secondary | ICD-10-CM | POA: Diagnosis not present

## 2014-06-06 DIAGNOSIS — G894 Chronic pain syndrome: Secondary | ICD-10-CM | POA: Diagnosis not present

## 2014-06-07 DIAGNOSIS — G894 Chronic pain syndrome: Secondary | ICD-10-CM | POA: Diagnosis not present

## 2014-06-08 DIAGNOSIS — G894 Chronic pain syndrome: Secondary | ICD-10-CM | POA: Diagnosis not present

## 2014-06-09 DIAGNOSIS — G894 Chronic pain syndrome: Secondary | ICD-10-CM | POA: Diagnosis not present

## 2014-06-10 DIAGNOSIS — G894 Chronic pain syndrome: Secondary | ICD-10-CM | POA: Diagnosis not present

## 2014-06-11 DIAGNOSIS — G894 Chronic pain syndrome: Secondary | ICD-10-CM | POA: Diagnosis not present

## 2014-06-12 DIAGNOSIS — G894 Chronic pain syndrome: Secondary | ICD-10-CM | POA: Diagnosis not present

## 2014-06-13 DIAGNOSIS — G894 Chronic pain syndrome: Secondary | ICD-10-CM | POA: Diagnosis not present

## 2014-06-14 DIAGNOSIS — G894 Chronic pain syndrome: Secondary | ICD-10-CM | POA: Diagnosis not present

## 2014-06-15 DIAGNOSIS — M25562 Pain in left knee: Secondary | ICD-10-CM | POA: Diagnosis not present

## 2014-06-15 DIAGNOSIS — M79641 Pain in right hand: Secondary | ICD-10-CM | POA: Diagnosis not present

## 2014-06-15 DIAGNOSIS — M79642 Pain in left hand: Secondary | ICD-10-CM | POA: Diagnosis not present

## 2014-06-15 DIAGNOSIS — G894 Chronic pain syndrome: Secondary | ICD-10-CM | POA: Diagnosis not present

## 2014-06-15 DIAGNOSIS — R51 Headache: Secondary | ICD-10-CM | POA: Diagnosis not present

## 2014-06-15 DIAGNOSIS — M25561 Pain in right knee: Secondary | ICD-10-CM | POA: Diagnosis not present

## 2014-06-16 DIAGNOSIS — G894 Chronic pain syndrome: Secondary | ICD-10-CM | POA: Diagnosis not present

## 2014-06-17 DIAGNOSIS — G894 Chronic pain syndrome: Secondary | ICD-10-CM | POA: Diagnosis not present

## 2014-06-18 DIAGNOSIS — G894 Chronic pain syndrome: Secondary | ICD-10-CM | POA: Diagnosis not present

## 2014-06-19 DIAGNOSIS — G894 Chronic pain syndrome: Secondary | ICD-10-CM | POA: Diagnosis not present

## 2014-06-20 DIAGNOSIS — G894 Chronic pain syndrome: Secondary | ICD-10-CM | POA: Diagnosis not present

## 2014-06-21 DIAGNOSIS — G894 Chronic pain syndrome: Secondary | ICD-10-CM | POA: Diagnosis not present

## 2014-06-22 DIAGNOSIS — G894 Chronic pain syndrome: Secondary | ICD-10-CM | POA: Diagnosis not present

## 2014-06-23 DIAGNOSIS — G894 Chronic pain syndrome: Secondary | ICD-10-CM | POA: Diagnosis not present

## 2014-06-24 DIAGNOSIS — G894 Chronic pain syndrome: Secondary | ICD-10-CM | POA: Diagnosis not present

## 2014-06-25 DIAGNOSIS — G894 Chronic pain syndrome: Secondary | ICD-10-CM | POA: Diagnosis not present

## 2014-06-26 DIAGNOSIS — G43909 Migraine, unspecified, not intractable, without status migrainosus: Secondary | ICD-10-CM | POA: Diagnosis not present

## 2014-06-26 DIAGNOSIS — G894 Chronic pain syndrome: Secondary | ICD-10-CM | POA: Diagnosis not present

## 2014-06-26 DIAGNOSIS — D649 Anemia, unspecified: Secondary | ICD-10-CM | POA: Diagnosis not present

## 2014-06-26 DIAGNOSIS — M129 Arthropathy, unspecified: Secondary | ICD-10-CM | POA: Diagnosis not present

## 2014-06-27 DIAGNOSIS — G894 Chronic pain syndrome: Secondary | ICD-10-CM | POA: Diagnosis not present

## 2014-06-28 DIAGNOSIS — G894 Chronic pain syndrome: Secondary | ICD-10-CM | POA: Diagnosis not present

## 2014-06-29 DIAGNOSIS — G894 Chronic pain syndrome: Secondary | ICD-10-CM | POA: Diagnosis not present

## 2014-06-30 DIAGNOSIS — G894 Chronic pain syndrome: Secondary | ICD-10-CM | POA: Diagnosis not present

## 2014-07-01 DIAGNOSIS — G894 Chronic pain syndrome: Secondary | ICD-10-CM | POA: Diagnosis not present

## 2014-07-02 DIAGNOSIS — G894 Chronic pain syndrome: Secondary | ICD-10-CM | POA: Diagnosis not present

## 2014-07-03 DIAGNOSIS — G894 Chronic pain syndrome: Secondary | ICD-10-CM | POA: Diagnosis not present

## 2014-07-04 DIAGNOSIS — G894 Chronic pain syndrome: Secondary | ICD-10-CM | POA: Diagnosis not present

## 2014-07-05 DIAGNOSIS — G894 Chronic pain syndrome: Secondary | ICD-10-CM | POA: Diagnosis not present

## 2014-07-06 DIAGNOSIS — G894 Chronic pain syndrome: Secondary | ICD-10-CM | POA: Diagnosis not present

## 2014-07-07 DIAGNOSIS — G894 Chronic pain syndrome: Secondary | ICD-10-CM | POA: Diagnosis not present

## 2014-07-08 DIAGNOSIS — G894 Chronic pain syndrome: Secondary | ICD-10-CM | POA: Diagnosis not present

## 2014-07-09 DIAGNOSIS — G894 Chronic pain syndrome: Secondary | ICD-10-CM | POA: Diagnosis not present

## 2014-07-10 ENCOUNTER — Emergency Department (HOSPITAL_COMMUNITY)
Admission: EM | Admit: 2014-07-10 | Discharge: 2014-07-10 | Disposition: A | Discharge status: Home / Self Care | Payer: Medicare Other | Attending: Emergency Medicine | Admitting: Emergency Medicine

## 2014-07-10 ENCOUNTER — Emergency Department (HOSPITAL_COMMUNITY): Payer: Medicare Other

## 2014-07-10 ENCOUNTER — Encounter (HOSPITAL_COMMUNITY): Payer: Self-pay | Admitting: *Deleted

## 2014-07-10 DIAGNOSIS — Z791 Long term (current) use of non-steroidal anti-inflammatories (NSAID): Secondary | ICD-10-CM | POA: Insufficient documentation

## 2014-07-10 DIAGNOSIS — R110 Nausea: Secondary | ICD-10-CM | POA: Insufficient documentation

## 2014-07-10 DIAGNOSIS — R5383 Other fatigue: Secondary | ICD-10-CM | POA: Insufficient documentation

## 2014-07-10 DIAGNOSIS — R509 Fever, unspecified: Secondary | ICD-10-CM | POA: Insufficient documentation

## 2014-07-10 DIAGNOSIS — Z862 Personal history of diseases of the blood and blood-forming organs and certain disorders involving the immune mechanism: Secondary | ICD-10-CM | POA: Insufficient documentation

## 2014-07-10 DIAGNOSIS — F3131 Bipolar disorder, current episode depressed, mild: Secondary | ICD-10-CM | POA: Insufficient documentation

## 2014-07-10 DIAGNOSIS — R000 Tachycardia, unspecified: Secondary | ICD-10-CM | POA: Diagnosis not present

## 2014-07-10 DIAGNOSIS — Z79899 Other long term (current) drug therapy: Secondary | ICD-10-CM | POA: Insufficient documentation

## 2014-07-10 DIAGNOSIS — R51 Headache: Secondary | ICD-10-CM | POA: Diagnosis present

## 2014-07-10 DIAGNOSIS — F419 Anxiety disorder, unspecified: Secondary | ICD-10-CM | POA: Diagnosis not present

## 2014-07-10 DIAGNOSIS — E860 Dehydration: Secondary | ICD-10-CM | POA: Diagnosis not present

## 2014-07-10 DIAGNOSIS — R6889 Other general symptoms and signs: Secondary | ICD-10-CM

## 2014-07-10 DIAGNOSIS — G43909 Migraine, unspecified, not intractable, without status migrainosus: Secondary | ICD-10-CM | POA: Diagnosis not present

## 2014-07-10 DIAGNOSIS — M1990 Unspecified osteoarthritis, unspecified site: Secondary | ICD-10-CM | POA: Diagnosis not present

## 2014-07-10 DIAGNOSIS — E669 Obesity, unspecified: Secondary | ICD-10-CM | POA: Diagnosis not present

## 2014-07-10 DIAGNOSIS — M549 Dorsalgia, unspecified: Secondary | ICD-10-CM | POA: Diagnosis not present

## 2014-07-10 DIAGNOSIS — M791 Myalgia: Secondary | ICD-10-CM | POA: Diagnosis not present

## 2014-07-10 DIAGNOSIS — R05 Cough: Secondary | ICD-10-CM | POA: Insufficient documentation

## 2014-07-10 DIAGNOSIS — G894 Chronic pain syndrome: Secondary | ICD-10-CM | POA: Diagnosis not present

## 2014-07-10 DIAGNOSIS — R630 Anorexia: Secondary | ICD-10-CM | POA: Diagnosis not present

## 2014-07-10 LAB — URINALYSIS, ROUTINE W REFLEX MICROSCOPIC
Bilirubin Urine: NEGATIVE
GLUCOSE, UA: NEGATIVE mg/dL
KETONES UR: NEGATIVE mg/dL
LEUKOCYTES UA: NEGATIVE
Nitrite: NEGATIVE
PROTEIN: NEGATIVE mg/dL
SPECIFIC GRAVITY, URINE: 1.017 (ref 1.005–1.030)
UROBILINOGEN UA: 1 mg/dL (ref 0.0–1.0)
pH: 8 (ref 5.0–8.0)

## 2014-07-10 LAB — I-STAT CHEM 8, ED
BUN: 5 mg/dL — AB (ref 6–23)
Calcium, Ion: 1.16 mmol/L (ref 1.12–1.23)
Chloride: 104 mmol/L (ref 96–112)
Creatinine, Ser: 0.7 mg/dL (ref 0.50–1.10)
Glucose, Bld: 125 mg/dL — ABNORMAL HIGH (ref 70–99)
HCT: 33 % — ABNORMAL LOW (ref 36.0–46.0)
HEMOGLOBIN: 11.2 g/dL — AB (ref 12.0–15.0)
POTASSIUM: 3.8 mmol/L (ref 3.5–5.1)
Sodium: 140 mmol/L (ref 135–145)
TCO2: 20 mmol/L (ref 0–100)

## 2014-07-10 LAB — URINE MICROSCOPIC-ADD ON

## 2014-07-10 LAB — I-STAT CG4 LACTIC ACID, ED: LACTIC ACID, VENOUS: 1.6 mmol/L (ref 0.5–2.0)

## 2014-07-10 MED ORDER — DIPHENHYDRAMINE HCL 50 MG/ML IJ SOLN
25.0000 mg | Freq: Once | INTRAMUSCULAR | Status: AC
  Administered 2014-07-10: 25 mg via INTRAVENOUS
  Filled 2014-07-10: qty 1

## 2014-07-10 MED ORDER — SODIUM CHLORIDE 0.9 % IV BOLUS (SEPSIS)
1000.0000 mL | Freq: Once | INTRAVENOUS | Status: AC
  Administered 2014-07-10: 1000 mL via INTRAVENOUS

## 2014-07-10 MED ORDER — IBUPROFEN 800 MG PO TABS
800.0000 mg | ORAL_TABLET | Freq: Once | ORAL | Status: AC
  Administered 2014-07-10: 800 mg via ORAL
  Filled 2014-07-10: qty 1

## 2014-07-10 MED ORDER — METOCLOPRAMIDE HCL 5 MG/ML IJ SOLN
10.0000 mg | Freq: Once | INTRAMUSCULAR | Status: AC
  Administered 2014-07-10: 10 mg via INTRAVENOUS
  Filled 2014-07-10: qty 2

## 2014-07-10 MED ORDER — ACETAMINOPHEN 500 MG PO TABS
1000.0000 mg | ORAL_TABLET | Freq: Once | ORAL | Status: AC
  Administered 2014-07-10: 1000 mg via ORAL
  Filled 2014-07-10: qty 2

## 2014-07-10 NOTE — ED Notes (Signed)
Pt presents from home reports feeling fatigued with generalized b/a, h/a, productive cough with yellow/blood tinged sputum and nausea x2 days. Pt reports having a fever of 103.0 and hasn't taken anything for the pain.

## 2014-07-10 NOTE — ED Notes (Signed)
Pt is in stable condition upon d/c and is escorted via wheelchair from ED. Pt verbalizes understanding rt d/c instructions and medications.

## 2014-07-10 NOTE — ED Notes (Signed)
Ambualtede ot in the hallway O2 saturations remained at 99% (room-air) no complaints noted at this time

## 2014-07-10 NOTE — ED Notes (Signed)
MD at bedside. 

## 2014-07-10 NOTE — ED Provider Notes (Signed)
CSN: [id number]     Arrival date & time 07/10/14  1019 History   First MD Initiated Contact with Patient 07/10/14 1027     Chief Complaint  Patient presents with  . Headache  . Generalized Body Aches     (Consider location/radiation/quality/duration/timing/severity/associated sxs/prior Treatment) HPI Comments: 39 year old female with obesity, depression, migraine history presents with fever, chills, cough, congestion, body aches worsening since Friday. Patient was around school children prior to this one child was sick. Patient denies a neck stiffness, generalized headache gradually worsening. Patient has a history of migraines, patient currently has light sensitivity. No new rashes. Patient hurts everywhere.  The history is provided by the patient and the spouse.    Past Medical History  Diagnosis Date  . Depression   . Back pain   . Obesity   . Migraines   . Anemia   . SVD (spontaneous vaginal delivery)     x 3  . Anxiety   . Bipolar disorder   . Arthritis    Past Surgical History  Procedure Laterality Date  . Tubal ligation  2005  . Cervical conization w/bx  10/30/2011    Procedure: CONIZATION CERVIX WITH BIOPSY;  Surgeon: Kathreen Cosier, MD;  Location: [facility];  Service: Gynecology;  Laterality: N/A;  . Dilation and curettage of uterus  10/30/2011    Procedure: DILATATION AND CURETTAGE;  Surgeon: Kathreen Cosier, MD;  Location: [facility];  Service: Gynecology;  Laterality: N/A;  . Wisdom tooth extraction    . Dilitation & currettage/hystroscopy with hydrothermal ablation N/A 12/14/2013    Procedure: DILATATION & CURETTAGE/HYSTEROSCOPY WITH HYDROTHERMAL ABLATION;  Surgeon: Kathreen Cosier, MD;  Location: [facility];  Service: Gynecology;  Laterality: N/A;   Family History  Problem Relation Age of Onset  . Lupus Sister   . Diabetes type II    . Stroke     History  Substance Use Topics  . Smoking status: Never Smoker   . Smokeless tobacco: Never Used  . Alcohol Use: No    OB History    No data available     Review of Systems  Constitutional: Positive for fever, chills, appetite change and fatigue.  HENT: Negative for congestion.   Eyes: Positive for photophobia. Negative for visual disturbance.  Respiratory: Positive for cough. Negative for shortness of breath.   Cardiovascular: Negative for chest pain.  Gastrointestinal: Positive for nausea. Negative for vomiting and abdominal pain.  Genitourinary: Negative for dysuria and flank pain.  Musculoskeletal: Positive for back pain and arthralgias. Negative for neck pain and neck stiffness.  Skin: Negative for rash.  Neurological: Positive for light-headedness and headaches.      Allergies  Buspar  Home Medications   Prior to Admission medications   Medication Sig Start Date End Date Taking? Authorizing Provider  alprazolam Prudy Feeler) 2 MG tablet Take 2 mg by mouth 2 (two) times daily as needed for anxiety.   Yes Historical Provider, MD  amphetamine-dextroamphetamine (ADDERALL) 30 MG tablet Take 30 mg by mouth 2 (two) times daily.   Yes Historical Provider, MD  cyclobenzaprine (FLEXERIL) 10 MG tablet Take 1 tablet (10 mg total) by mouth 2 (two) times daily as needed for muscle spasms. 10/26/13  Yes Shari A Upstill, PA-C  diclofenac (VOLTAREN) 75 MG EC tablet Take 75 mg by mouth 2 (two) times daily.   Yes Historical Provider, MD  ibuprofen (ADVIL,MOTRIN) 800 MG tablet Take 1 tablet (800 mg total) by mouth 3 (three) times daily. Patient taking differently:  Take 800 mg by mouth every 6 (six) hours as needed for moderate pain.  10/26/13  Yes Shari A Upstill, PA-C  mirtazapine (REMERON) 15 MG tablet Take 15 mg by mouth at bedtime.   Yes Historical Provider, MD  oxyCODONE-acetaminophen (PERCOCET) 10-325 MG per tablet Take 1 tablet by mouth 5 (five) times daily as needed for pain. For pain.   Yes Historical Provider, MD  SUMAtriptan (IMITREX) 100 MG tablet Take 100 mg by mouth every 2 (two) hours as needed for  migraine. May repeat in 2 hours if headache persists or recurs.   Yes Historical Provider, MD  topiramate (TOPAMAX) 25 MG tablet Take 25 mg by mouth 2 (two) times daily.   Yes Historical Provider, MD   BP 110/62 mmHg  Pulse 113  Temp(Src) 103.1 F (39.5 C) (Oral)  Resp 18  SpO2 96%  LMP 06/26/2014 Physical Exam  Constitutional: She is oriented to person, place, and time. She appears well-developed and well-nourished. No distress.  HENT:  Head: Normocephalic and atraumatic.  Mouth/Throat: Oropharyngeal exudate: congested.  Neck supple no meningismus dry mucous membranes.  Eyes: Right eye exhibits no discharge. Left eye exhibits no discharge.  Neck: Normal range of motion. Neck supple. No tracheal deviation present.  Cardiovascular: Regular rhythm.  Tachycardia present.   Pulmonary/Chest: Effort normal and breath sounds normal.  Abdominal: Soft. She exhibits no distension. There is no tenderness. There is no guarding.  Musculoskeletal: She exhibits tenderness (patient tender to palpation multiple muscle skeletal areas on the body, no obvious effusion or skin rash noticed.). She exhibits no edema.  Neurological: She is alert and oriented to person, place, and time. No cranial nerve deficit.  Skin: Skin is warm. No rash noted.  Psychiatric: She has a normal mood and affect.  Nursing note and vitals reviewed.   ED Course  Procedures (including critical care time) Labs Review Labs Reviewed  URINALYSIS, ROUTINE W REFLEX MICROSCOPIC - Abnormal; Notable for the following:    Hgb urine dipstick SMALL (*)    All other components within normal limits  I-STAT CHEM 8, ED - Abnormal; Notable for the following:    BUN 5 (*)    Glucose, Bld 125 (*)    Hemoglobin 11.2 (*)    HCT 33.0 (*)    All other components within normal limits  URINE MICROSCOPIC-ADD ON  I-STAT CG4 LACTIC ACID, ED    Imaging Review Dg Chest 2 View  07/10/2014   CLINICAL DATA:  Cough and fever  EXAM: CHEST  2 VIEW   COMPARISON:  10/26/2013  FINDINGS: Interstitial crowding from low lung volumes. There is no new opacity to suggest pneumonia; increased density of the lower spine in the lateral projection is likely from overlying soft tissues rather than consolidation. No edema, effusion, or pneumothorax. Borderline cardiomegaly, with size accentuated by technique. Negative aortic and hilar contours.  IMPRESSION: Negative low volume chest.   Electronically Signed   By: Marnee SpringJonathon  Watts M.D.   On: 07/10/2014 11:13     EKG Interpretation None      MDM   Final diagnoses:  Flu-like symptoms  Dehydration  Sinus tachycardia   Patient presents with multiple different symptoms consistent with flulike syndrome. Land for basic blood work/lactate, chest x-ray look for bacterial pneumonia, fluids, antipyretics and migraine medications. No neck stiffness or concerning rashes exam. Patient is with significant other and discussed importance of close outpatient follow-up once symptomatic control improved and x-ray performed.  Patient improved in the ER, heart rate improved,  mild tachycardia and discharge, 2 L fluid bolus ordered in ER. Oral fluids given. Lactate normal no meningismus, aggressive supportive care and close follow-up outpatient for recheck in 48 hours. Results and differential diagnosis were discussed with the patient/parent/guardian. Close follow up outpatient was discussed, comfortable with the plan.   Medications  sodium chloride 0.9 % bolus 1,000 mL (0 mLs Intravenous Stopped 07/10/14 1333)  acetaminophen (TYLENOL) tablet 1,000 mg (1,000 mg Oral Given 07/10/14 1122)  ibuprofen (ADVIL,MOTRIN) tablet 800 mg (800 mg Oral Given 07/10/14 1122)  metoCLOPramide (REGLAN) injection 10 mg (10 mg Intravenous Given 07/10/14 1120)  diphenhydrAMINE (BENADRYL) injection 25 mg (25 mg Intravenous Given 07/10/14 1120)  sodium chloride 0.9 % bolus 1,000 mL (1,000 mLs Intravenous New Bag/Given 07/10/14 1334)    Filed Vitals:    07/10/14 1230 07/10/14 1245 07/10/14 1300 07/10/14 1315  BP: 109/69 115/61 106/69 110/62  Pulse: 108 105 109 113  Temp:      TempSrc:      Resp: SpO2: 98% 97% 96% 96%    Final diagnoses:  Flu-like symptoms  Dehydration  Sinus tachycardia       Enid Skeens, MD 07/10/14 1444

## 2014-07-10 NOTE — Discharge Instructions (Signed)
Stay hydrated. Tylenol every 4 hrs and ibuprofen every 6 hrs. Return for neck stiffness, worsening symptoms, confusion, other.  If you were given medicines take as directed.  If you are on coumadin or contraceptives realize their levels and effectiveness is altered by many different medicines.  If you have any reaction (rash, tongues swelling, other) to the medicines stop taking and see a physician.   Please follow up as directed and return to the ER or see a physician for new or worsening symptoms.  Thank you. Filed Vitals:   07/10/14 1230 07/10/14 1245 07/10/14 1300 07/10/14 1315  BP: 109/69 115/61 106/69 110/62  Pulse: 108 105 109 113  Temp:      TempSrc:      Resp: 20 19 20 18   SpO2: 98% 97% 96% 96%

## 2014-07-11 DIAGNOSIS — G894 Chronic pain syndrome: Secondary | ICD-10-CM | POA: Diagnosis not present

## 2014-07-12 DIAGNOSIS — G894 Chronic pain syndrome: Secondary | ICD-10-CM | POA: Diagnosis not present

## 2014-07-13 DIAGNOSIS — G894 Chronic pain syndrome: Secondary | ICD-10-CM | POA: Diagnosis not present

## 2014-07-13 DIAGNOSIS — G603 Idiopathic progressive neuropathy: Secondary | ICD-10-CM | POA: Diagnosis not present

## 2014-07-13 DIAGNOSIS — G8929 Other chronic pain: Secondary | ICD-10-CM | POA: Diagnosis not present

## 2014-07-13 DIAGNOSIS — M545 Low back pain: Secondary | ICD-10-CM | POA: Diagnosis not present

## 2014-07-14 DIAGNOSIS — G894 Chronic pain syndrome: Secondary | ICD-10-CM | POA: Diagnosis not present

## 2014-07-15 DIAGNOSIS — G894 Chronic pain syndrome: Secondary | ICD-10-CM | POA: Diagnosis not present

## 2014-07-16 DIAGNOSIS — G894 Chronic pain syndrome: Secondary | ICD-10-CM | POA: Diagnosis not present

## 2014-07-17 DIAGNOSIS — G894 Chronic pain syndrome: Secondary | ICD-10-CM | POA: Diagnosis not present

## 2014-07-18 DIAGNOSIS — G894 Chronic pain syndrome: Secondary | ICD-10-CM | POA: Diagnosis not present

## 2014-07-19 DIAGNOSIS — G894 Chronic pain syndrome: Secondary | ICD-10-CM | POA: Diagnosis not present

## 2014-07-20 DIAGNOSIS — G894 Chronic pain syndrome: Secondary | ICD-10-CM | POA: Diagnosis not present

## 2014-07-21 DIAGNOSIS — G894 Chronic pain syndrome: Secondary | ICD-10-CM | POA: Diagnosis not present

## 2014-07-22 DIAGNOSIS — G894 Chronic pain syndrome: Secondary | ICD-10-CM | POA: Diagnosis not present

## 2014-07-23 DIAGNOSIS — G894 Chronic pain syndrome: Secondary | ICD-10-CM | POA: Diagnosis not present

## 2014-07-24 DIAGNOSIS — G894 Chronic pain syndrome: Secondary | ICD-10-CM | POA: Diagnosis not present

## 2014-07-25 DIAGNOSIS — G894 Chronic pain syndrome: Secondary | ICD-10-CM | POA: Diagnosis not present

## 2014-07-26 DIAGNOSIS — G894 Chronic pain syndrome: Secondary | ICD-10-CM | POA: Diagnosis not present

## 2014-07-27 DIAGNOSIS — G894 Chronic pain syndrome: Secondary | ICD-10-CM | POA: Diagnosis not present

## 2014-07-28 DIAGNOSIS — G894 Chronic pain syndrome: Secondary | ICD-10-CM | POA: Diagnosis not present

## 2014-07-29 DIAGNOSIS — G894 Chronic pain syndrome: Secondary | ICD-10-CM | POA: Diagnosis not present

## 2014-07-30 DIAGNOSIS — G894 Chronic pain syndrome: Secondary | ICD-10-CM | POA: Diagnosis not present

## 2014-07-31 DIAGNOSIS — G894 Chronic pain syndrome: Secondary | ICD-10-CM | POA: Diagnosis not present

## 2014-08-01 DIAGNOSIS — G894 Chronic pain syndrome: Secondary | ICD-10-CM | POA: Diagnosis not present

## 2014-08-02 DIAGNOSIS — G894 Chronic pain syndrome: Secondary | ICD-10-CM | POA: Diagnosis not present

## 2014-08-03 DIAGNOSIS — G894 Chronic pain syndrome: Secondary | ICD-10-CM | POA: Diagnosis not present

## 2014-08-04 DIAGNOSIS — G894 Chronic pain syndrome: Secondary | ICD-10-CM | POA: Diagnosis not present

## 2014-08-05 DIAGNOSIS — G894 Chronic pain syndrome: Secondary | ICD-10-CM | POA: Diagnosis not present

## 2014-08-06 DIAGNOSIS — G894 Chronic pain syndrome: Secondary | ICD-10-CM | POA: Diagnosis not present

## 2014-08-07 DIAGNOSIS — G894 Chronic pain syndrome: Secondary | ICD-10-CM | POA: Diagnosis not present

## 2014-08-08 DIAGNOSIS — G894 Chronic pain syndrome: Secondary | ICD-10-CM | POA: Diagnosis not present

## 2014-08-09 DIAGNOSIS — G894 Chronic pain syndrome: Secondary | ICD-10-CM | POA: Diagnosis not present

## 2014-08-10 DIAGNOSIS — M545 Low back pain: Secondary | ICD-10-CM | POA: Diagnosis not present

## 2014-08-10 DIAGNOSIS — G894 Chronic pain syndrome: Secondary | ICD-10-CM | POA: Diagnosis not present

## 2014-08-10 DIAGNOSIS — M25511 Pain in right shoulder: Secondary | ICD-10-CM | POA: Diagnosis not present

## 2014-08-10 DIAGNOSIS — M25512 Pain in left shoulder: Secondary | ICD-10-CM | POA: Diagnosis not present

## 2014-08-10 DIAGNOSIS — M542 Cervicalgia: Secondary | ICD-10-CM | POA: Diagnosis not present

## 2014-08-10 DIAGNOSIS — G8929 Other chronic pain: Secondary | ICD-10-CM | POA: Diagnosis not present

## 2014-08-11 ENCOUNTER — Encounter (HOSPITAL_COMMUNITY): Payer: Self-pay | Admitting: Family Medicine

## 2014-08-11 ENCOUNTER — Emergency Department (HOSPITAL_COMMUNITY)
Admission: EM | Admit: 2014-08-11 | Discharge: 2014-08-11 | Disposition: A | Discharge status: Home / Self Care | Payer: Medicare Other | Attending: Emergency Medicine | Admitting: Emergency Medicine

## 2014-08-11 ENCOUNTER — Emergency Department (HOSPITAL_COMMUNITY): Payer: Medicare Other

## 2014-08-11 DIAGNOSIS — G43909 Migraine, unspecified, not intractable, without status migrainosus: Secondary | ICD-10-CM | POA: Diagnosis not present

## 2014-08-11 DIAGNOSIS — E669 Obesity, unspecified: Secondary | ICD-10-CM | POA: Insufficient documentation

## 2014-08-11 DIAGNOSIS — M1990 Unspecified osteoarthritis, unspecified site: Secondary | ICD-10-CM | POA: Insufficient documentation

## 2014-08-11 DIAGNOSIS — Z862 Personal history of diseases of the blood and blood-forming organs and certain disorders involving the immune mechanism: Secondary | ICD-10-CM | POA: Diagnosis not present

## 2014-08-11 DIAGNOSIS — R05 Cough: Secondary | ICD-10-CM | POA: Diagnosis not present

## 2014-08-11 DIAGNOSIS — Z79899 Other long term (current) drug therapy: Secondary | ICD-10-CM | POA: Diagnosis not present

## 2014-08-11 DIAGNOSIS — Z8709 Personal history of other diseases of the respiratory system: Secondary | ICD-10-CM | POA: Insufficient documentation

## 2014-08-11 DIAGNOSIS — R059 Cough, unspecified: Secondary | ICD-10-CM

## 2014-08-11 DIAGNOSIS — Z791 Long term (current) use of non-steroidal anti-inflammatories (NSAID): Secondary | ICD-10-CM | POA: Insufficient documentation

## 2014-08-11 DIAGNOSIS — F419 Anxiety disorder, unspecified: Secondary | ICD-10-CM | POA: Diagnosis not present

## 2014-08-11 DIAGNOSIS — F319 Bipolar disorder, unspecified: Secondary | ICD-10-CM | POA: Diagnosis not present

## 2014-08-11 DIAGNOSIS — G894 Chronic pain syndrome: Secondary | ICD-10-CM | POA: Diagnosis not present

## 2014-08-11 MED ORDER — HYDROCOD POLST-CHLORPHEN POLST 10-8 MG/5ML PO LQCR: 5.0000 mL | Freq: Two times a day (BID) | ORAL | Status: DC | PRN

## 2014-08-11 NOTE — ED Notes (Signed)
Per pt sts productive cough x 1 month. sts also some nasal congestion.

## 2014-08-11 NOTE — ED Provider Notes (Signed)
CSN: [id number]     Arrival date & time 08/11/14  0803 History   First MD Initiated Contact with Patient 08/11/14 [phone]     Chief Complaint  Patient presents with  . Cough     (Consider location/radiation/quality/duration/timing/severity/associated sxs/prior Treatment) HPI Comments: Pt states that she had a cough for a month. She states that she was diagnosed with flu about a month ago and  Symptoms have not got away. Denies continued fever. Has tried medications without relief  The history is provided by the patient. No language interpreter was used.    Past Medical History  Diagnosis Date  . Depression   . Back pain   . Obesity   . Migraines   . Anemia   . SVD (spontaneous vaginal delivery)     x 3  . Anxiety   . Bipolar disorder   . Arthritis    Past Surgical History  Procedure Laterality Date  . Tubal ligation  2005  . Cervical conization w/bx  10/30/2011    Procedure: CONIZATION CERVIX WITH BIOPSY;  Surgeon: Kathreen CosierBernard A Marshall, MD;  Location: [facility];  Service: Gynecology;  Laterality: N/A;  . Dilation and curettage of uterus  10/30/2011    Procedure: DILATATION AND CURETTAGE;  Surgeon: Kathreen CosierBernard A Marshall, MD;  Location: [facility];  Service: Gynecology;  Laterality: N/A;  . Wisdom tooth extraction    . Dilitation & currettage/hystroscopy with hydrothermal ablation N/A 12/14/2013    Procedure: DILATATION & CURETTAGE/HYSTEROSCOPY WITH HYDROTHERMAL ABLATION;  Surgeon: Kathreen CosierBernard A Marshall, MD;  Location: [facility];  Service: Gynecology;  Laterality: N/A;   Family History  Problem Relation Age of Onset  . Lupus Sister   . Diabetes type II    . Stroke     History  Substance Use Topics  . Smoking status: Never Smoker   . Smokeless tobacco: Never Used  . Alcohol Use: No   OB History    No data available     Review of Systems  All other systems reviewed and are negative.     Allergies  Buspar  Home Medications   Prior to Admission medications   Medication Sig Start  Date End Date Taking? Authorizing Provider  alprazolam Prudy Feeler(XANAX) 2 MG tablet Take 2 mg by mouth 2 (two) times daily as needed for anxiety.    Historical Provider, MD  amphetamine-dextroamphetamine (ADDERALL) 30 MG tablet Take 30 mg by mouth 2 (two) times daily.    Historical Provider, MD  chlorpheniramine-HYDROcodone (TUSSIONEX PENNKINETIC ER) 10-8 MG/5ML LQCR Take 5 mLs by mouth every 12 (twelve) hours as needed for cough. 08/11/14   Teressa LowerVrinda Adaira Centola, NP  cyclobenzaprine (FLEXERIL) 10 MG tablet Take 1 tablet (10 mg total) by mouth 2 (two) times daily as needed for muscle spasms. 10/26/13   Elpidio AnisShari Upstill, PA-C  diclofenac (VOLTAREN) 75 MG EC tablet Take 75 mg by mouth 2 (two) times daily.    Historical Provider, MD  ibuprofen (ADVIL,MOTRIN) 800 MG tablet Take 1 tablet (800 mg total) by mouth 3 (three) times daily. Patient taking differently: Take 800 mg by mouth every 6 (six) hours as needed for moderate pain.  10/26/13   Elpidio AnisShari Upstill, PA-C  mirtazapine (REMERON) 15 MG tablet Take 15 mg by mouth at bedtime.    Historical Provider, MD  oxyCODONE-acetaminophen (PERCOCET) 10-325 MG per tablet Take 1 tablet by mouth 5 (five) times daily as needed for pain. For pain.    Historical Provider, MD  SUMAtriptan (IMITREX) 100 MG tablet Take 100 mg  by mouth every 2 (two) hours as needed for migraine. May repeat in 2 hours if headache persists or recurs.    Historical Provider, MD  topiramate (TOPAMAX) 25 MG tablet Take 25 mg by mouth 2 (two) times daily.    Historical Provider, MD   BP 133/53 mmHg  Pulse 85  Temp(Src) 97.9 F (36.6 C)  Resp 18  SpO2 100%  LMP 07/24/2014 Physical Exam  Constitutional: She is oriented to person, place, and time. She appears well-developed and well-nourished.  HENT:  Head: Normocephalic and atraumatic.  Right Ear: External ear normal.  Left Ear: External ear normal.  Cardiovascular: Normal rate and regular rhythm.   Pulmonary/Chest: Effort normal and breath sounds normal.   Musculoskeletal: Normal range of motion.  Neurological: She is alert and oriented to person, place, and time.  Skin: Skin is warm and dry.  Nursing note and vitals reviewed.   ED Course  Procedures (including critical care time) Labs Review Labs Reviewed - No data to display  Imaging Review Dg Chest 2 View  08/11/2014   CLINICAL DATA:  Lingering productive cough.  EXAM: CHEST  2 VIEW  COMPARISON:  07/10/2014  FINDINGS: Normal heart size and mediastinal contours. No acute infiltrate or edema. No effusion or pneumothorax. Bilateral cervical ribs. No acute osseous findings.  IMPRESSION: Negative chest.   Electronically Signed   By: Marnee Spring M.D.   On: 08/11/2014 08:39     EKG Interpretation None      MDM   Final diagnoses:  Cough    No pneumonia.  Will treat symptomatically with cough syrup    Teressa Lower, NP 08/11/14 1610  Blane Ohara, MD 08/12/14 2342

## 2014-08-11 NOTE — Discharge Instructions (Signed)
Cough, Adult   A cough is a reflex. It helps you clear your throat and airways. A cough can help heal your body. A cough can last 2 or 3 weeks (acute) or may last more than 8 weeks (chronic). Some common causes of a cough can include an infection, allergy, or a cold.  HOME CARE  · Only take medicine as told by your doctor.  · If given, take your medicines (antibiotics) as told. Finish them even if you start to feel better.  · Use a cold steam vaporizer or humidifier in your home. This can help loosen thick spit (secretions).  · Sleep so you are almost sitting up (semi-upright). Use pillows to do this. This helps reduce coughing.  · Rest as needed.  · Stop smoking if you smoke.  GET HELP RIGHT AWAY IF:  · You have yellowish-white fluid (pus) in your thick spit.  · Your cough gets worse.  · Your medicine does not reduce coughing, and you are losing sleep.  · You cough up blood.  · You have trouble breathing.  · Your pain gets worse and medicine does not help.  · You have a fever.  MAKE SURE YOU:   · Understand these instructions.  · Will watch your condition.  · Will get help right away if you are not doing well or get worse.  Document Released: 01/23/2011 Document Revised: 09/26/2013 Document Reviewed: 01/23/2011  [employer]® Patient Information ©2015 [employer]. This information is not intended to replace advice given to you by your health care provider. Make sure you discuss any questions you have with your health care provider.

## 2014-08-12 DIAGNOSIS — G894 Chronic pain syndrome: Secondary | ICD-10-CM | POA: Diagnosis not present

## 2014-08-13 DIAGNOSIS — G894 Chronic pain syndrome: Secondary | ICD-10-CM | POA: Diagnosis not present

## 2014-08-14 DIAGNOSIS — G894 Chronic pain syndrome: Secondary | ICD-10-CM | POA: Diagnosis not present

## 2014-08-15 DIAGNOSIS — G894 Chronic pain syndrome: Secondary | ICD-10-CM | POA: Diagnosis not present

## 2014-08-16 DIAGNOSIS — G894 Chronic pain syndrome: Secondary | ICD-10-CM | POA: Diagnosis not present

## 2014-08-17 DIAGNOSIS — G894 Chronic pain syndrome: Secondary | ICD-10-CM | POA: Diagnosis not present

## 2014-08-18 DIAGNOSIS — G894 Chronic pain syndrome: Secondary | ICD-10-CM | POA: Diagnosis not present

## 2014-08-19 DIAGNOSIS — G894 Chronic pain syndrome: Secondary | ICD-10-CM | POA: Diagnosis not present

## 2014-09-07 DIAGNOSIS — M79641 Pain in right hand: Secondary | ICD-10-CM | POA: Diagnosis not present

## 2014-09-07 DIAGNOSIS — M25561 Pain in right knee: Secondary | ICD-10-CM | POA: Diagnosis not present

## 2014-09-07 DIAGNOSIS — M79642 Pain in left hand: Secondary | ICD-10-CM | POA: Diagnosis not present

## 2014-09-07 DIAGNOSIS — G8929 Other chronic pain: Secondary | ICD-10-CM | POA: Diagnosis not present

## 2014-09-07 DIAGNOSIS — M545 Low back pain: Secondary | ICD-10-CM | POA: Diagnosis not present

## 2014-09-07 DIAGNOSIS — M25562 Pain in left knee: Secondary | ICD-10-CM | POA: Diagnosis not present

## 2014-10-05 DIAGNOSIS — M79672 Pain in left foot: Secondary | ICD-10-CM | POA: Diagnosis not present

## 2014-10-05 DIAGNOSIS — M25561 Pain in right knee: Secondary | ICD-10-CM | POA: Diagnosis not present

## 2014-10-05 DIAGNOSIS — R51 Headache: Secondary | ICD-10-CM | POA: Diagnosis not present

## 2014-10-05 DIAGNOSIS — M25562 Pain in left knee: Secondary | ICD-10-CM | POA: Diagnosis not present

## 2014-10-05 DIAGNOSIS — M545 Low back pain: Secondary | ICD-10-CM | POA: Diagnosis not present

## 2014-11-02 DIAGNOSIS — M79672 Pain in left foot: Secondary | ICD-10-CM | POA: Diagnosis not present

## 2014-11-02 DIAGNOSIS — M79604 Pain in right leg: Secondary | ICD-10-CM | POA: Diagnosis not present

## 2014-11-02 DIAGNOSIS — G8929 Other chronic pain: Secondary | ICD-10-CM | POA: Diagnosis not present

## 2014-11-02 DIAGNOSIS — M545 Low back pain: Secondary | ICD-10-CM | POA: Diagnosis not present

## 2014-11-02 DIAGNOSIS — M79605 Pain in left leg: Secondary | ICD-10-CM | POA: Diagnosis not present

## 2014-11-09 DIAGNOSIS — M129 Arthropathy, unspecified: Secondary | ICD-10-CM | POA: Diagnosis not present

## 2014-11-09 DIAGNOSIS — G894 Chronic pain syndrome: Secondary | ICD-10-CM | POA: Diagnosis not present

## 2014-11-09 DIAGNOSIS — M5136 Other intervertebral disc degeneration, lumbar region: Secondary | ICD-10-CM | POA: Diagnosis not present

## 2014-11-09 DIAGNOSIS — Z6841 Body Mass Index (BMI) 40.0 and over, adult: Secondary | ICD-10-CM | POA: Diagnosis not present

## 2014-11-29 DIAGNOSIS — M545 Low back pain: Secondary | ICD-10-CM | POA: Diagnosis not present

## 2014-11-29 DIAGNOSIS — G890 Central pain syndrome: Secondary | ICD-10-CM | POA: Diagnosis not present

## 2014-11-29 DIAGNOSIS — Z79899 Other long term (current) drug therapy: Secondary | ICD-10-CM | POA: Diagnosis not present

## 2014-11-30 DIAGNOSIS — G890 Central pain syndrome: Secondary | ICD-10-CM | POA: Diagnosis not present

## 2014-11-30 DIAGNOSIS — R202 Paresthesia of skin: Secondary | ICD-10-CM | POA: Diagnosis not present

## 2014-11-30 DIAGNOSIS — M545 Low back pain: Secondary | ICD-10-CM | POA: Diagnosis not present

## 2014-12-12 DIAGNOSIS — M545 Low back pain: Secondary | ICD-10-CM | POA: Diagnosis not present

## 2014-12-28 DIAGNOSIS — M5417 Radiculopathy, lumbosacral region: Secondary | ICD-10-CM | POA: Diagnosis not present

## 2014-12-28 DIAGNOSIS — G8929 Other chronic pain: Secondary | ICD-10-CM | POA: Diagnosis not present

## 2014-12-28 DIAGNOSIS — M79672 Pain in left foot: Secondary | ICD-10-CM | POA: Diagnosis not present

## 2014-12-28 DIAGNOSIS — M79605 Pain in left leg: Secondary | ICD-10-CM | POA: Diagnosis not present

## 2014-12-28 DIAGNOSIS — G541 Lumbosacral plexus disorders: Secondary | ICD-10-CM | POA: Diagnosis not present

## 2014-12-28 DIAGNOSIS — M79604 Pain in right leg: Secondary | ICD-10-CM | POA: Diagnosis not present

## 2014-12-28 DIAGNOSIS — R202 Paresthesia of skin: Secondary | ICD-10-CM | POA: Diagnosis not present

## 2015-01-24 DIAGNOSIS — M25561 Pain in right knee: Secondary | ICD-10-CM | POA: Diagnosis not present

## 2015-01-24 DIAGNOSIS — Z79891 Long term (current) use of opiate analgesic: Secondary | ICD-10-CM | POA: Diagnosis not present

## 2015-01-24 DIAGNOSIS — M25511 Pain in right shoulder: Secondary | ICD-10-CM | POA: Diagnosis not present

## 2015-01-24 DIAGNOSIS — M545 Low back pain: Secondary | ICD-10-CM | POA: Diagnosis not present

## 2015-01-24 DIAGNOSIS — F3175 Bipolar disorder, in partial remission, most recent episode depressed: Secondary | ICD-10-CM | POA: Diagnosis not present

## 2015-01-24 DIAGNOSIS — F900 Attention-deficit hyperactivity disorder, predominantly inattentive type: Secondary | ICD-10-CM | POA: Diagnosis not present

## 2015-01-24 DIAGNOSIS — F410 Panic disorder [episodic paroxysmal anxiety] without agoraphobia: Secondary | ICD-10-CM | POA: Diagnosis not present

## 2015-01-24 DIAGNOSIS — Z79899 Other long term (current) drug therapy: Secondary | ICD-10-CM | POA: Diagnosis not present

## 2015-01-24 DIAGNOSIS — M25562 Pain in left knee: Secondary | ICD-10-CM | POA: Diagnosis not present

## 2015-01-24 DIAGNOSIS — G8929 Other chronic pain: Secondary | ICD-10-CM | POA: Diagnosis not present

## 2015-02-05 DIAGNOSIS — M5136 Other intervertebral disc degeneration, lumbar region: Secondary | ICD-10-CM | POA: Diagnosis not present

## 2015-02-05 DIAGNOSIS — E8770 Fluid overload, unspecified: Secondary | ICD-10-CM | POA: Diagnosis not present

## 2015-02-05 DIAGNOSIS — D649 Anemia, unspecified: Secondary | ICD-10-CM | POA: Diagnosis not present

## 2015-02-05 DIAGNOSIS — E669 Obesity, unspecified: Secondary | ICD-10-CM | POA: Diagnosis not present

## 2015-02-05 DIAGNOSIS — Z1389 Encounter for screening for other disorder: Secondary | ICD-10-CM | POA: Diagnosis not present

## 2015-02-05 DIAGNOSIS — Z13220 Encounter for screening for lipoid disorders: Secondary | ICD-10-CM | POA: Diagnosis not present

## 2015-02-05 DIAGNOSIS — M129 Arthropathy, unspecified: Secondary | ICD-10-CM | POA: Diagnosis not present

## 2015-02-05 DIAGNOSIS — Z131 Encounter for screening for diabetes mellitus: Secondary | ICD-10-CM | POA: Diagnosis not present

## 2015-02-05 DIAGNOSIS — M722 Plantar fascial fibromatosis: Secondary | ICD-10-CM | POA: Diagnosis not present

## 2015-02-21 DIAGNOSIS — G8929 Other chronic pain: Secondary | ICD-10-CM | POA: Diagnosis not present

## 2015-02-21 DIAGNOSIS — M5417 Radiculopathy, lumbosacral region: Secondary | ICD-10-CM | POA: Diagnosis not present

## 2015-02-21 DIAGNOSIS — M25511 Pain in right shoulder: Secondary | ICD-10-CM | POA: Diagnosis not present

## 2015-02-21 DIAGNOSIS — M25561 Pain in right knee: Secondary | ICD-10-CM | POA: Diagnosis not present

## 2015-02-21 DIAGNOSIS — M25512 Pain in left shoulder: Secondary | ICD-10-CM | POA: Diagnosis not present

## 2015-03-29 ENCOUNTER — Emergency Department (HOSPITAL_COMMUNITY)
Admission: EM | Admit: 2015-03-29 | Discharge: 2015-03-29 | Disposition: A | Discharge status: Home / Self Care | Payer: Medicare Other | Attending: Emergency Medicine | Admitting: Emergency Medicine

## 2015-03-29 ENCOUNTER — Encounter (HOSPITAL_COMMUNITY): Payer: Self-pay | Admitting: Family Medicine

## 2015-03-29 DIAGNOSIS — F319 Bipolar disorder, unspecified: Secondary | ICD-10-CM | POA: Diagnosis not present

## 2015-03-29 DIAGNOSIS — G8929 Other chronic pain: Secondary | ICD-10-CM | POA: Diagnosis not present

## 2015-03-29 DIAGNOSIS — G43909 Migraine, unspecified, not intractable, without status migrainosus: Secondary | ICD-10-CM | POA: Diagnosis not present

## 2015-03-29 DIAGNOSIS — Z791 Long term (current) use of non-steroidal anti-inflammatories (NSAID): Secondary | ICD-10-CM | POA: Insufficient documentation

## 2015-03-29 DIAGNOSIS — F419 Anxiety disorder, unspecified: Secondary | ICD-10-CM | POA: Insufficient documentation

## 2015-03-29 DIAGNOSIS — E669 Obesity, unspecified: Secondary | ICD-10-CM | POA: Insufficient documentation

## 2015-03-29 DIAGNOSIS — Z862 Personal history of diseases of the blood and blood-forming organs and certain disorders involving the immune mechanism: Secondary | ICD-10-CM | POA: Diagnosis not present

## 2015-03-29 DIAGNOSIS — Z79899 Other long term (current) drug therapy: Secondary | ICD-10-CM | POA: Insufficient documentation

## 2015-03-29 DIAGNOSIS — R52 Pain, unspecified: Secondary | ICD-10-CM | POA: Diagnosis present

## 2015-03-29 DIAGNOSIS — M2550 Pain in unspecified joint: Secondary | ICD-10-CM | POA: Diagnosis not present

## 2015-03-29 MED ORDER — KETOROLAC TROMETHAMINE 60 MG/2ML IM SOLN
30.0000 mg | Freq: Once | INTRAMUSCULAR | Status: AC
  Administered 2015-03-29: 30 mg via INTRAMUSCULAR
  Filled 2015-03-29: qty 2

## 2015-03-29 NOTE — ED Provider Notes (Signed)
CSN: [id number]     Arrival date & time 03/29/15  0854 History   First MD Initiated Contact with Patient 03/29/15 0857     No chief complaint on file.    (Consider location/radiation/quality/duration/timing/severity/associated sxs/prior Treatment) Patient is a 39 y.o. female presenting with general illness.  Illness Location:  Generalized, all joints Quality:  Aching pain Severity:  Moderate Onset quality:  Gradual Timing:  Constant Progression:  Unchanged Chronicity:  Chronic Context:  Ran out of pain medication last week, no new symptoms, identical to chronic pain Relieved by:  Nothing Worsened by:  Movement Associated symptoms: no chest pain, no fever, no nausea and no vomiting     Past Medical History  Diagnosis Date  . Depression   . Back pain   . Obesity   . Migraines   . Anemia   . SVD (spontaneous vaginal delivery)     x 3  . Anxiety   . Bipolar disorder   . Arthritis    Past Surgical History  Procedure Laterality Date  . Tubal ligation  2005  . Cervical conization w/bx  10/30/2011    Procedure: CONIZATION CERVIX WITH BIOPSY;  Surgeon: Kathreen CosierBernard A Marshall, MD;  Location: [facility];  Service: Gynecology;  Laterality: N/A;  . Dilation and curettage of uterus  10/30/2011    Procedure: DILATATION AND CURETTAGE;  Surgeon: Kathreen CosierBernard A Marshall, MD;  Location: [facility];  Service: Gynecology;  Laterality: N/A;  . Wisdom tooth extraction    . Dilitation & currettage/hystroscopy with hydrothermal ablation N/A 12/14/2013    Procedure: DILATATION & CURETTAGE/HYSTEROSCOPY WITH HYDROTHERMAL ABLATION;  Surgeon: Kathreen CosierBernard A Marshall, MD;  Location: [facility];  Service: Gynecology;  Laterality: N/A;   Family History  Problem Relation Age of Onset  . Lupus Sister   . Diabetes type II    . Stroke     Social History  Substance Use Topics  . Smoking status: Never Smoker   . Smokeless tobacco: Never Used  . Alcohol Use: No   OB History    No data available     Review of Systems   Constitutional: Negative for fever.  Cardiovascular: Negative for chest pain.  Gastrointestinal: Negative for nausea and vomiting.  All other systems reviewed and are negative.     Allergies  Buspar  Home Medications   Prior to Admission medications   Medication Sig Start Date End Date Taking? Authorizing Provider  alprazolam Prudy Feeler(XANAX) 2 MG tablet Take 2 mg by mouth 2 (two) times daily as needed for anxiety.    Historical Provider, MD  amphetamine-dextroamphetamine (ADDERALL) 30 MG tablet Take 30 mg by mouth 2 (two) times daily.    Historical Provider, MD  chlorpheniramine-HYDROcodone (TUSSIONEX PENNKINETIC ER) 10-8 MG/5ML LQCR Take 5 mLs by mouth every 12 (twelve) hours as needed for cough. 08/11/14   Teressa LowerVrinda Pickering, NP  cyclobenzaprine (FLEXERIL) 10 MG tablet Take 1 tablet (10 mg total) by mouth 2 (two) times daily as needed for muscle spasms. 10/26/13   Elpidio AnisShari Upstill, PA-C  diclofenac (VOLTAREN) 75 MG EC tablet Take 75 mg by mouth 2 (two) times daily.    Historical Provider, MD  ibuprofen (ADVIL,MOTRIN) 800 MG tablet Take 1 tablet (800 mg total) by mouth 3 (three) times daily. Patient taking differently: Take 800 mg by mouth every 6 (six) hours as needed for moderate pain.  10/26/13   Elpidio AnisShari Upstill, PA-C  mirtazapine (REMERON) 15 MG tablet Take 15 mg by mouth at bedtime.    Historical Provider, MD  oxyCODONE-acetaminophen (PERCOCET) 10-325 MG per tablet Take 1 tablet by mouth 5 (five) times daily as needed for pain. For pain.    Historical Provider, MD  SUMAtriptan (IMITREX) 100 MG tablet Take 100 mg by mouth every 2 (two) hours as needed for migraine. May repeat in 2 hours if headache persists or recurs.    Historical Provider, MD  topiramate (TOPAMAX) 25 MG tablet Take 25 mg by mouth 2 (two) times daily.    Historical Provider, MD   BP 123/74 mmHg  Pulse 80  Temp(Src) 97.6 F (36.4 C) (Oral)  Resp 20  SpO2 100% Physical Exam  Constitutional: She is oriented to person, place, and  time. She appears well-developed and well-nourished.  HENT:  Head: Normocephalic and atraumatic.  Right Ear: External ear normal.  Left Ear: External ear normal.  Eyes: Conjunctivae and EOM are normal. Pupils are equal, round, and reactive to light.  Neck: Normal range of motion. Neck supple.  Cardiovascular: Normal rate, regular rhythm, normal heart sounds and intact distal pulses.   Pulmonary/Chest: Effort normal and breath sounds normal.  Abdominal: Soft. Bowel sounds are normal. There is no tenderness.  Musculoskeletal: Normal range of motion.  Neurological: She is alert and oriented to person, place, and time.  Skin: Skin is warm and dry.  Vitals reviewed.   ED Course  Procedures (including critical care time) Labs Review Labs Reviewed - No data to display  Imaging Review No results found. I have personally reviewed and evaluated these images and lab results as part of my medical decision-making.   EKG Interpretation None      MDM   Final diagnoses:  Chronic pain    39 y.o. female with pertinent PMH of chronic pain, obesity, migraines presents with chronic symptoms as she ran out of medication.  She states she was told by her primary care doctor to present for a prescription for pain medicine. She denies any new symptoms, systemic symptoms such as fever. Exam benign. I discussed departmental narcotic policy and that the patient would not receive any narcotics at this time. She was understanding, however upset. Discharged home in stable condition..    I have reviewed all laboratory and imaging studies if ordered as above  1. Chronic pain         Mirian Mo, MD 03/29/15 [phone]

## 2015-03-29 NOTE — ED Notes (Signed)
Pt comfortable with discharge and follow up instructions. No prescriptions. 

## 2015-03-29 NOTE — ED Notes (Signed)
Pt presents with generalized chronic pain.  She is between pain clinics at this time and is requesting pain medications until her next appointment.

## 2015-03-29 NOTE — Discharge Instructions (Signed)

## 2016-01-02 ENCOUNTER — Emergency Department (HOSPITAL_COMMUNITY)
Admission: EM | Admit: 2016-01-02 | Discharge: 2016-01-03 | Disposition: A | Discharge status: Home / Self Care | Payer: Medicare Other | Attending: Emergency Medicine | Admitting: Emergency Medicine

## 2016-01-02 ENCOUNTER — Encounter (HOSPITAL_COMMUNITY): Payer: Self-pay | Admitting: Emergency Medicine

## 2016-01-02 DIAGNOSIS — Y939 Activity, unspecified: Secondary | ICD-10-CM | POA: Diagnosis not present

## 2016-01-02 DIAGNOSIS — R51 Headache: Secondary | ICD-10-CM | POA: Diagnosis present

## 2016-01-02 DIAGNOSIS — Y929 Unspecified place or not applicable: Secondary | ICD-10-CM | POA: Diagnosis not present

## 2016-01-02 DIAGNOSIS — Y999 Unspecified external cause status: Secondary | ICD-10-CM | POA: Diagnosis not present

## 2016-01-02 DIAGNOSIS — M25562 Pain in left knee: Secondary | ICD-10-CM | POA: Diagnosis not present

## 2016-01-02 DIAGNOSIS — R1031 Right lower quadrant pain: Secondary | ICD-10-CM | POA: Diagnosis not present
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
## 2016-01-03 ENCOUNTER — Emergency Department (HOSPITAL_COMMUNITY): Payer: Medicare Other

## 2016-01-03 ENCOUNTER — Encounter (HOSPITAL_COMMUNITY): Payer: Self-pay | Admitting: Radiology

## 2016-01-03 DIAGNOSIS — R51 Headache: Secondary | ICD-10-CM | POA: Diagnosis not present

## 2016-01-03 LAB — I-STAT BETA HCG BLOOD, ED (MC, WL, AP ONLY): I-stat hCG, quantitative: <5 m[IU]/mL (ref <5)

## 2016-01-03 LAB — I-STAT CHEM 8, ED
BUN: 8 mg/dL (ref 6–20)
CHLORIDE: 102 mmol/L (ref 101–111)
Calcium, Ion: 1.22 mmol/L (ref 1.13–1.30)
Creatinine, Ser: 0.6 mg/dL (ref 0.44–1.00)
Glucose, Bld: 111 mg/dL — ABNORMAL HIGH (ref 65–99)
HEMATOCRIT: 34 % — AB (ref 36.0–46.0)
Hemoglobin: 11.6 g/dL — ABNORMAL LOW (ref 12.0–15.0)
POTASSIUM: 3.6 mmol/L (ref 3.5–5.1)
SODIUM: 141 mmol/L (ref 135–145)
TCO2: 27 mmol/L (ref 0–100)

## 2016-01-03 MED ORDER — ONDANSETRON HCL 4 MG/2ML IJ SOLN
4.0000 mg | Freq: Once | INTRAMUSCULAR | Status: AC
  Administered 2016-01-03: 4 mg via INTRAVENOUS
  Filled 2016-01-03: qty 2

## 2016-01-03 MED ORDER — IOPAMIDOL (ISOVUE-300) INJECTION 61%
INTRAVENOUS | Status: AC
  Administered 2016-01-03: 100 mL
  Filled 2016-01-03: qty 100

## 2016-01-03 MED ORDER — FENTANYL CITRATE (PF) 100 MCG/2ML IJ SOLN
50.0000 ug | Freq: Once | INTRAMUSCULAR | Status: AC
  Administered 2016-01-03: 50 ug via INTRAVENOUS
  Filled 2016-01-03: qty 2

## 2016-01-03 NOTE — ED Notes (Signed)
Pt understood dc material. NAD noted. 

## 2016-01-03 NOTE — ED Notes (Signed)
Patient transported to X-ray 
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
## 2016-01-05 ENCOUNTER — Encounter (HOSPITAL_COMMUNITY): Payer: Self-pay

## 2016-01-05 ENCOUNTER — Emergency Department (HOSPITAL_COMMUNITY)
Admission: EM | Admit: 2016-01-05 | Discharge: 2016-01-05 | Disposition: A | Discharge status: Home / Self Care | Payer: No Typology Code available for payment source | Attending: Emergency Medicine | Admitting: Emergency Medicine

## 2016-01-05 ENCOUNTER — Emergency Department (HOSPITAL_COMMUNITY): Payer: No Typology Code available for payment source

## 2016-01-05 DIAGNOSIS — S161XXD Strain of muscle, fascia and tendon at neck level, subsequent encounter: Secondary | ICD-10-CM | POA: Diagnosis not present

## 2016-01-05 DIAGNOSIS — S199XXD Unspecified injury of neck, subsequent encounter: Secondary | ICD-10-CM | POA: Diagnosis present

## 2016-01-05 MED ORDER — OXYCODONE-ACETAMINOPHEN 5-325 MG PO TABS: 1.0000 | ORAL_TABLET | Freq: Four times a day (QID) | ORAL | 0 refills | Status: DC | PRN

## 2016-01-05 MED ORDER — IBUPROFEN 800 MG PO TABS: 800.0000 mg | ORAL_TABLET | Freq: Three times a day (TID) | ORAL | 0 refills | Status: DC | PRN

## 2016-01-05 MED ORDER — KETOROLAC TROMETHAMINE 60 MG/2ML IM SOLN
60.0000 mg | Freq: Once | INTRAMUSCULAR | Status: AC
  Administered 2016-01-05: 60 mg via INTRAMUSCULAR
  Filled 2016-01-05: qty 2

## 2016-01-05 MED ORDER — OXYCODONE-ACETAMINOPHEN 5-325 MG PO TABS
1.0000 | ORAL_TABLET | Freq: Once | ORAL | Status: AC
  Administered 2016-01-05: 1 via ORAL
  Filled 2016-01-05: qty 1
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
## 2016-01-05 NOTE — ED Triage Notes (Signed)
PT requested a can of soda to take pain med. Pt refused to take soda can because RN had touched can and had opened straw. RN returned  With second can and straw . Husband had to open pain pill paper because Pt reported she did not want anyone touching their stuff. Family and pt consumed 5 cans of soda .

## 2016-01-05 NOTE — Discharge Instructions (Signed)
Return here as needed.  Follow-up with your primary care doctor.  Your x-rays today did not show any abnormalities and reviewing her CT scan does not show any significant findings at that time

## 2016-02-20 ENCOUNTER — Emergency Department (HOSPITAL_COMMUNITY): Payer: Medicare Other

## 2016-02-20 ENCOUNTER — Encounter (HOSPITAL_COMMUNITY): Payer: Self-pay | Admitting: Emergency Medicine

## 2016-02-20 ENCOUNTER — Emergency Department (HOSPITAL_COMMUNITY)
Admission: EM | Admit: 2016-02-20 | Discharge: 2016-02-20 | Disposition: A | Discharge status: Home / Self Care | Payer: Medicare Other | Attending: Emergency Medicine | Admitting: Emergency Medicine

## 2016-02-20 DIAGNOSIS — M542 Cervicalgia: Secondary | ICD-10-CM | POA: Diagnosis present

## 2016-02-20 DIAGNOSIS — B9789 Other viral agents as the cause of diseases classified elsewhere: Secondary | ICD-10-CM

## 2016-02-20 DIAGNOSIS — J069 Acute upper respiratory infection, unspecified: Secondary | ICD-10-CM | POA: Insufficient documentation

## 2016-02-20 MED ORDER — GUAIFENESIN ER 1200 MG PO TB12: 1.0000 | ORAL_TABLET | Freq: Two times a day (BID) | ORAL | 0 refills | Status: DC

## 2016-02-20 MED ORDER — PREDNISONE 50 MG PO TABS: 50.0000 mg | ORAL_TABLET | Freq: Every day | ORAL | 0 refills | Status: DC

## 2016-02-20 MED ORDER — ACETAMINOPHEN-CODEINE 120-12 MG/5ML PO SOLN: 10.0000 mL | ORAL | 0 refills | Status: DC | PRN

## 2016-02-20 MED ORDER — HYDROCOD POLST-CPM POLST ER 10-8 MG/5ML PO SUER
5.0000 mL | Freq: Once | ORAL | Status: AC
  Administered 2016-02-20: 5 mL via ORAL
  Filled 2016-02-20: qty 5

## 2016-02-20 NOTE — ED Notes (Signed)
Patient transported to CT 

## 2016-02-20 NOTE — ED Notes (Signed)
Pt states she understands instructions. Home stable with family\. 
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
## 2016-02-20 NOTE — Discharge Instructions (Signed)
Return here as needed.  Follow-up with your primary care Dr. increase her fluid intake and rest as much as possible °
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
## 2016-07-19 ENCOUNTER — Emergency Department (HOSPITAL_COMMUNITY)
Admission: EM | Admit: 2016-07-19 | Discharge: 2016-07-19 | Disposition: A | Discharge status: Home / Self Care | Payer: Medicare Other | Attending: Emergency Medicine | Admitting: Emergency Medicine

## 2016-07-19 ENCOUNTER — Emergency Department (HOSPITAL_COMMUNITY): Payer: Medicare Other

## 2016-07-19 ENCOUNTER — Encounter (HOSPITAL_COMMUNITY): Payer: Self-pay

## 2016-07-19 DIAGNOSIS — Z79899 Other long term (current) drug therapy: Secondary | ICD-10-CM | POA: Insufficient documentation

## 2016-07-19 DIAGNOSIS — S82843A Displaced bimalleolar fracture of unspecified lower leg, initial encounter for closed fracture: Secondary | ICD-10-CM

## 2016-07-19 DIAGNOSIS — Y929 Unspecified place or not applicable: Secondary | ICD-10-CM | POA: Insufficient documentation

## 2016-07-19 DIAGNOSIS — S99912A Unspecified injury of left ankle, initial encounter: Secondary | ICD-10-CM | POA: Diagnosis present

## 2016-07-19 DIAGNOSIS — Y9301 Activity, walking, marching and hiking: Secondary | ICD-10-CM | POA: Insufficient documentation

## 2016-07-19 DIAGNOSIS — Y999 Unspecified external cause status: Secondary | ICD-10-CM | POA: Diagnosis not present

## 2016-07-19 DIAGNOSIS — S8262XA Displaced fracture of lateral malleolus of left fibula, initial encounter for closed fracture: Secondary | ICD-10-CM | POA: Diagnosis not present

## 2016-07-19 DIAGNOSIS — S82842A Displaced bimalleolar fracture of left lower leg, initial encounter for closed fracture: Secondary | ICD-10-CM

## 2016-07-19 DIAGNOSIS — S82845A Nondisplaced bimalleolar fracture of left lower leg, initial encounter for closed fracture: Secondary | ICD-10-CM | POA: Diagnosis not present

## 2016-07-19 DIAGNOSIS — T148XXA Other injury of unspecified body region, initial encounter: Secondary | ICD-10-CM

## 2016-07-19 DIAGNOSIS — W108XXA Fall (on) (from) other stairs and steps, initial encounter: Secondary | ICD-10-CM | POA: Diagnosis not present

## 2016-07-19 HISTORY — DX: Displaced bimalleolar fracture of unspecified lower leg, initial encounter for closed fracture: S82.843A

## 2016-07-19 MED ORDER — PROPOFOL 10 MG/ML IV BOLUS
2.0000 mg/kg | Freq: Once | INTRAVENOUS | Status: AC
  Administered 2016-07-19: 272.2 mg via INTRAVENOUS
  Filled 2016-07-19: qty 40

## 2016-07-19 MED ORDER — OXYCODONE HCL 5 MG PO TABS
10.0000 mg | ORAL_TABLET | Freq: Once | ORAL | Status: AC
  Administered 2016-07-19: 10 mg via ORAL
  Filled 2016-07-19: qty 2

## 2016-07-19 MED ORDER — HYDROMORPHONE HCL 2 MG/ML IJ SOLN
0.5000 mg | Freq: Once | INTRAMUSCULAR | Status: AC
  Administered 2016-07-19: 0.5 mg via INTRAVENOUS
  Filled 2016-07-19: qty 1

## 2016-07-19 MED ORDER — SODIUM CHLORIDE 0.9 % IV BOLUS (SEPSIS)
1000.0000 mL | Freq: Once | INTRAVENOUS | Status: AC
  Administered 2016-07-19: 1000 mL via INTRAVENOUS

## 2016-07-19 MED ORDER — ONDANSETRON HCL 4 MG/2ML IJ SOLN
4.0000 mg | Freq: Once | INTRAMUSCULAR | Status: AC
  Administered 2016-07-19: 4 mg via INTRAVENOUS
  Filled 2016-07-19: qty 2

## 2016-07-19 NOTE — Sedation Documentation (Signed)
[facility]

## 2016-07-19 NOTE — Progress Notes (Signed)
Orthopedic Tech Progress Note Patient Details:  Megan ShoutsKendra L Levy 10-Jun-1975 [id number]  Ortho Devices Type of Ortho Device: Ace wrap, Post (short leg) splint, Stirrup splint Ortho Device/Splint Interventions: Application   Saul FordyceJennifer C Kreston Ahrendt 07/19/2016, 2:23 PM

## 2016-07-19 NOTE — Sedation Documentation (Signed)
Pt is now awake and responding to verbal stimluli pt states that she is unable to utilize crutches

## 2016-07-19 NOTE — Sedation Documentation (Signed)
ER MD, ER PA and Ortho tech at bedside pt remains fully awake and alert

## 2016-07-19 NOTE — Sedation Documentation (Signed)
 10mg  IV propofol given by ER MD to total 180mg 

## 2016-07-19 NOTE — Discharge Instructions (Signed)
Please read and follow all provided instructions.  Your diagnoses today include:  1. Closed bimalleolar fracture of left ankle, initial encounter   2. Fracture     Tests performed today include:  An x-ray of the affected area - shows ankle fracture  Vital signs. See below for your results today.   Medications prescribed:   None  Take any prescribed medications only as directed.  Home care instructions:   Follow any educational materials contained in this packet  Follow R.I.C.E. Protocol:  R - rest your injury   I  - use ice on injury without applying directly to skin  C - compress injury with bandage or splint  E - elevate the injury as much as possible  Follow-up instructions: Please follow-up with Dr. Roda ShuttersXu next week as planned for outpatient surgery.    Return instructions:   Please return if your toes or feet are numb or tingling, appear gray or blue, or you have severe pain (also elevate the leg and loosen splint or wrap if you were given one)  Please return to the Emergency Department if you experience worsening symptoms.   Please return if you have any other emergent concerns.  Additional Information:  Your vital signs today were: BP 107/60 (BP Location: Right Arm)    Pulse 75    Temp 98.2 F (36.8 C) (Oral)    Resp 13    Ht 5\' 6"  (1.676 m)    Wt 136.1 kg    SpO2 100%    BMI 48.42 kg/m  If your blood pressure (BP) was elevated above 135/85 this visit, please have this repeated by your doctor within one month. --------------

## 2016-07-19 NOTE — Sedation Documentation (Signed)
Pt starting to become sleepy with eyes closed family remains at bedside pt not responding to verbal stimuli

## 2016-07-19 NOTE — Sedation Documentation (Signed)
20 mg Additional Propofol given by ER MD

## 2016-07-19 NOTE — ED Provider Notes (Signed)
[facility] Provider Note   CSN: [id number] Arrival date & time: 07/19/16  1035     History   Chief Complaint No chief complaint on file.   HPI [name] is a 41 y.o. female.  Patient presents with complaint of left ankle pain starting acutely just prior to arrival. Patient was walking down stairs and slipped twisting her ankle. She was unable to walk. EMS was called. Patient has obvious deformity. 200 g of fentanyl given en route. Patient continues to complain of pain. No other injuries reported. The course is constant. Aggravating factors: movement. Alleviating factors: none.        Past Medical History:  Diagnosis Date  . Anemia   . Anxiety   . Arthritis   . Back pain   . Bipolar disorder (HCC)   . Depression   . Migraines   . Obesity   . SVD (spontaneous vaginal delivery)    x 3    Patient Active Problem List   Diagnosis Date Noted  . Anemia, unspecified 02/15/2013  . Pes anserine bursitis 01/20/2011  . Edema 08/21/2010  . Nonallopathic lesion of lumbar region 07/04/2010  . Obesity, unspecified 03/27/2010  . DEPRESSION, MAJOR, MILD 03/27/2010  . Chronic pain syndrome 03/27/2010  . NONALLOPATHIC LESION OF THORACIC REGION NEC 03/27/2010  . FATIGUE 03/27/2010  . HEADACHE 03/27/2010    Past Surgical History:  Procedure Laterality Date  . CERVICAL CONIZATION W/BX  10/30/2011   Procedure: CONIZATION CERVIX WITH BIOPSY;  Surgeon: Kathreen CosierBernard A Marshall, MD;  Location: [facility];  Service: Gynecology;  Laterality: N/A;  . DILATION AND CURETTAGE OF UTERUS  10/30/2011   Procedure: DILATATION AND CURETTAGE;  Surgeon: Kathreen CosierBernard A Marshall, MD;  Location: [facility];  Service: Gynecology;  Laterality: N/A;  . DILITATION & CURRETTAGE/HYSTROSCOPY WITH HYDROTHERMAL ABLATION N/A 12/14/2013   Procedure: DILATATION & CURETTAGE/HYSTEROSCOPY WITH HYDROTHERMAL ABLATION;  Surgeon: Kathreen CosierBernard A Marshall, MD;  Location: [facility];  Service: Gynecology;  Laterality: N/A;  . TUBAL LIGATION   2005  . WISDOM TOOTH EXTRACTION      OB History    No data available       Home Medications    Prior to Admission medications   Medication Sig Start Date End Date Taking? Authorizing Provider  acetaminophen-codeine 120-12 MG/5ML solution Take 10 mLs by mouth every 4 (four) hours as needed for moderate pain. 02/20/16   Charlestine Nighthristopher Lawyer, PA-C  alprazolam Prudy Feeler(XANAX) 2 MG tablet Take 2 mg by mouth 2 (two) times daily as needed for anxiety.    Historical Provider, MD  amphetamine-dextroamphetamine (ADDERALL) 30 MG tablet Take 30 mg by mouth 2 (two) times daily. 01/02/16   Historical Provider, MD  chlorpheniramine-HYDROcodone (TUSSIONEX PENNKINETIC ER) 10-8 MG/5ML LQCR Take 5 mLs by mouth every 12 (twelve) hours as needed for cough. Patient not taking: Reported on 02/20/2016 08/11/14   Teressa LowerVrinda Pickering, NP  citalopram (CELEXA) 40 MG tablet Take 40 mg by mouth daily. 12/24/15   Historical Provider, MD  cyclobenzaprine (FLEXERIL) 10 MG tablet Take 1 tablet (10 mg total) by mouth 2 (two) times daily as needed for muscle spasms. Patient not taking: Reported on 02/20/2016 10/26/13   Elpidio AnisShari Upstill, PA-C  Guaifenesin 1200 MG TB12 Take 1 tablet (1,200 mg total) by mouth 2 (two) times daily. 02/20/16   Charlestine Nighthristopher Lawyer, PA-C  ibuprofen (ADVIL,MOTRIN) 800 MG tablet Take 1 tablet (800 mg total) by mouth every 8 (eight) hours as needed. Patient not taking: Reported on 02/20/2016 01/05/16   Charlestine Nighthristopher Lawyer,  PA-C  mirtazapine (REMERON) 30 MG tablet Take 30 mg by mouth daily. 12/24/15   Historical Provider, MD  oxyCODONE-acetaminophen (PERCOCET/ROXICET) 5-325 MG tablet Take 1 tablet by mouth every 6 (six) hours as needed for severe pain. Patient not taking: Reported on 02/20/2016 01/05/16   Charlestine Night, PA-C  predniSONE (DELTASONE) 50 MG tablet Take 1 tablet (50 mg total) by mouth daily with breakfast. 02/20/16   Charlestine Night, PA-C    Family History Family History  Problem Relation Age of Onset  .  Lupus Sister   . Diabetes type II    . Stroke      Social History Social History  Substance Use Topics  . Smoking status: Never Smoker  . Smokeless tobacco: Never Used  . Alcohol use No     Allergies   Buspar [buspirone]   Review of Systems Review of Systems  Constitutional: Negative for activity change.  Musculoskeletal: Positive for arthralgias, gait problem and joint swelling. Negative for back pain and neck pain.  Skin: Negative for wound.  Neurological: Negative for weakness and numbness.     Physical Exam Updated Vital Signs BP 107/60 (BP Location: Right Arm)   Pulse 75   Temp 98.2 F (36.8 C) (Oral)   Resp 13   Ht 5\' 6"  (1.676 m)   Wt 136.1 kg   SpO2 100%   BMI 48.42 kg/m   Physical Exam  Constitutional: She appears well-developed and well-nourished.  HENT:  Head: Normocephalic and atraumatic.  Eyes: Pupils are equal, round, and reactive to light.  Neck: Normal range of motion. Neck supple.  Cardiovascular: Normal pulses.  Exam reveals no decreased pulses.   Musculoskeletal: She exhibits tenderness. She exhibits no edema.       Left knee: Normal.       Left ankle: She exhibits decreased range of motion, swelling and deformity. Tenderness.       Left foot: Normal.  Neurological: She is alert. No sensory deficit.  Motor, sensation, and vascular distal to the injury is fully intact.   Skin: Skin is warm and dry.  Psychiatric: She has a normal mood and affect.  Nursing note and vitals reviewed.    ED Treatments / Results  Labs (all labs ordered are listed, but only abnormal results are displayed) Labs Reviewed - No data to display  Radiology Dg Ankle Complete Left  Result Date: 07/19/2016 CLINICAL DATA:  Left ankle fracture dislocation. Status post reduction. Initial encounter. EXAM: LEFT ANKLE COMPLETE - 3+ VIEW COMPARISON:  07/19/2016 FINDINGS: Distal fibular fracture is now seen with fracture fragments in anatomic alignment. Probable fracture  fragment along the posterior malleolus of the tibia is again noted. The talus is now centered within the ankle mortise. Fiberglass cast has been applied which obscures fine bone detail. IMPRESSION: Distal fibular fracture now in near anatomic alignment status postreduction. Probable fracture fragment adjacent to posterior malleolus, without change. Electronically Signed   By: Myles Rosenthal M.D.   On: 07/19/2016 14:46   Dg Ankle Complete Left  Result Date: 07/19/2016 CLINICAL DATA:  Twisting injury, ankle deformity, pain. Initial encounter. EXAM: LEFT ANKLE COMPLETE - 3+ VIEW COMPARISON:  06/28/2003. FINDINGS: There is a displaced fracture of the distal fibular metaphysis with approximately 1 shaft width lateral displacement of the distal fracture fragment. Lateral displacement of the talus and distal fibular fracture fragment by approximately 1/2 tibial shaft width. There may be a tiny fracture fragment off the medial talar dome. Nondisplaced posterior malleolar fracture. IMPRESSION: 1. Lateral and  posterior malleolar ankle fracture dislocation. 2. Possible tiny fractures off the medial talar dome. Electronically Signed   By: Leanna Battles M.D.   On: 07/19/2016 11:29    Procedures Reduction of fracture Date/Time: 07/19/2016 3:51 PM Performed by: Renne Crigler Authorized by: Renne Crigler  Consent: Written consent obtained. Risks and benefits: risks, benefits and alternatives were discussed Consent given by: patient Patient understanding: patient states understanding of the procedure being performed Patient consent: the patient's understanding of the procedure matches consent given Imaging studies: imaging studies available Required items: required blood products, implants, devices, and special equipment available Patient identity confirmed: verbally with patient, arm band and provided demographic data Time out: Immediately prior to procedure a "time out" was called to verify the correct patient,  procedure, equipment, support staff and site/side marked as required. Local anesthesia used: no  Anesthesia: Local anesthesia used: no  Sedation: Patient sedated: yes Sedatives: propofol  Patient tolerance: Patient tolerated the procedure well with no immediate complications Comments: Sedation by Dr. Rush Landmark who was present during procedure. Reduction by myself. Dr. Roda Shutters also present. Reduced and splinted without complication.     (including critical care time)  Medications Ordered in ED Medications  HYDROmorphone (DILAUDID) injection 0.5 mg (0.5 mg Intravenous Given 07/19/16 1053)  ondansetron (ZOFRAN) injection 4 mg (4 mg Intravenous Given 07/19/16 1052)  sodium chloride 0.9 % bolus 1,000 mL (1,000 mLs Intravenous New Bag/Given 07/19/16 1229)  propofol (DIPRIVAN) 10 mg/mL bolus/IV push 272.2 mg (272.2 mg Intravenous Given 07/19/16 1329)  HYDROmorphone (DILAUDID) injection 0.5 mg (0.5 mg Intravenous Given 07/19/16 1424)     Initial Impression / Assessment and Plan / ED Course  I have reviewed the triage vital signs and the nursing notes.  Pertinent labs & imaging results that were available during my care of the patient were reviewed by me and considered in my medical decision making (see chart for details).     Patient seen and examined. Work-up initiated. Medications ordered.   Vital signs reviewed and are as follows: BP 121/71 (BP Location: Right Arm)   Pulse 74   Temp 98.2 F (36.8 C) (Oral)   Resp 18   Ht 5\' 6"  (1.676 m)   Wt 136.1 kg   SpO2 98%   BMI 48.42 kg/m   Patient discussed with Dr. Roda Shutters who advised we attempt reduction. Patient consented. Procedure note as above.   Patient awoke -- she has transferred to wheelchair. Follow-up with Dr. Roda Shutters as directed.  Patient is on chronic pain medication. Last rx oxycodone 10 mg tablets #90 3 times a day on 07/15/16.  Patient urged to return with worsening symptoms or other concerns. Patient verbalized understanding and  agrees with plan.     Final Clinical Impressions(s) / ED Diagnoses   Final diagnoses:  Closed bimalleolar fracture of left ankle, initial encounter   Patient with closed left ankle fracture, reduced. Foot was neurovascularly intact prior and post procedure. Surgery next week.   New Prescriptions Current Discharge Medication List       Renne Crigler, PA-C 07/19/16 1556    Canary Brim Tegeler, MD 07/22/16 [phone]

## 2016-07-19 NOTE — Sedation Documentation (Signed)
[facility] 140[facility] 

## 2016-07-19 NOTE — Sedation Documentation (Signed)
20 mg IV propofol given by ER MD

## 2016-07-19 NOTE — Sedation Documentation (Signed)
 20mg  IV propofol given to total 100mg  IV propofol total dose at this time

## 2016-07-19 NOTE — ED Notes (Signed)
PA at bedside pt remains fully awake and alert family at bedside

## 2016-07-19 NOTE — Sedation Documentation (Signed)
20 mg IV Propofol given L ankle starting to be manipulated

## 2016-07-19 NOTE — ED Notes (Signed)
Pt up out of bed utilizing crutches given to her by Ortho tech.  Pt placed in wheelchair with L leg up on a pillow on the chair family at bedside

## 2016-07-19 NOTE — Consult Note (Signed)
ORTHOPAEDIC CONSULTATION  REQUESTING PHYSICIAN: Heide Scales, MD  Chief Complaint: left ankle injury  HPI: [name] is a 41 y.o. female who presents with left ankle fracture s/p mechanical fall PTA to ER.  Denies any other injuries, LOC, neck pain.  Pain is severe in the left ankle with any movement.  Doesn't radiate.  Cannot bear any weight on LLE.  Ortho consulted.  Past Medical History:  Diagnosis Date  . Anemia   . Anxiety   . Arthritis   . Back pain   . Bipolar disorder (HCC)   . Depression   . Migraines   . Obesity   . SVD (spontaneous vaginal delivery)    x 3   Past Surgical History:  Procedure Laterality Date  . CERVICAL CONIZATION W/BX  10/30/2011   Procedure: CONIZATION CERVIX WITH BIOPSY;  Surgeon: Kathreen Cosier, MD;  Location: [facility];  Service: Gynecology;  Laterality: N/A;  . DILATION AND CURETTAGE OF UTERUS  10/30/2011   Procedure: DILATATION AND CURETTAGE;  Surgeon: Kathreen Cosier, MD;  Location: [facility];  Service: Gynecology;  Laterality: N/A;  . DILITATION & CURRETTAGE/HYSTROSCOPY WITH HYDROTHERMAL ABLATION N/A 12/14/2013   Procedure: DILATATION & CURETTAGE/HYSTEROSCOPY WITH HYDROTHERMAL ABLATION;  Surgeon: Kathreen Cosier, MD;  Location: [facility];  Service: Gynecology;  Laterality: N/A;  . TUBAL LIGATION  2005  . WISDOM TOOTH EXTRACTION     Social History   Social History  . Marital status: Single    Spouse name: N/A  . Number of children: N/A  . Years of education: N/A   Social History Main Topics  . Smoking status: Never Smoker  . Smokeless tobacco: Never Used  . Alcohol use No  . Drug use: No  . Sexual activity: Yes    Partners: Male    Birth control/ protection: Surgical     Comment: tubal in 2005   Other Topics Concern  . None   Social History Narrative   Lives with husband and three kids.    Family History  Problem Relation Age of Onset  . Lupus Sister   . Diabetes type II    . Stroke     - negative except  otherwise stated in the family history section Allergies  Allergen Reactions  . Buspar [Buspirone] Other (See Comments)    Made pt feel jittery.   Prior to Admission medications   Medication Sig Start Date End Date Taking? Authorizing Provider  alprazolam Prudy Feeler) 2 MG tablet Take 2 mg by mouth 2 (two) times daily as needed for anxiety.   Yes Historical Provider, MD  amphetamine-dextroamphetamine (ADDERALL) 30 MG tablet Take 30 mg by mouth 2 (two) times daily. 01/02/16  Yes Historical Provider, MD  celecoxib (CELEBREX) 200 MG capsule Take 200 mg by mouth daily. 07/07/16  Yes Historical Provider, MD  citalopram (CELEXA) 40 MG tablet Take 40 mg by mouth daily. 12/24/15  Yes Historical Provider, MD  Fe Cbn-Fe Gluc-FA-B12-C-DSS (FERRALET 90) 90-1 MG TABS Take 1 tablet by mouth daily. 06/08/16  Yes Historical Provider, MD  mirtazapine (REMERON) 30 MG tablet Take 30 mg by mouth daily. 12/24/15  Yes Historical Provider, MD  Oxycodone HCl 10 MG TABS Take 10 mg by mouth 3 (three) times daily. 07/15/16  Yes Historical Provider, MD  acetaminophen-codeine 120-12 MG/5ML solution Take 10 mLs by mouth every 4 (four) hours as needed for moderate pain. Patient not taking: Reported on 07/19/2016 02/20/16   Charlestine Night, PA-C  chlorpheniramine-HYDROcodone [facility])  10-8 MG/5ML LQCR Take 5 mLs by mouth every 12 (twelve) hours as needed for cough. Patient not taking: Reported on 02/20/2016 08/11/14   Teressa LowerVrinda Pickering, NP  cyclobenzaprine (FLEXERIL) 10 MG tablet Take 1 tablet (10 mg total) by mouth 2 (two) times daily as needed for muscle spasms. Patient not taking: Reported on 02/20/2016 10/26/13   Elpidio AnisShari Upstill, PA-C  Guaifenesin 1200 MG TB12 Take 1 tablet (1,200 mg total) by mouth 2 (two) times daily. Patient not taking: Reported on 07/19/2016 02/20/16   Charlestine Nighthristopher Lawyer, PA-C  ibuprofen (ADVIL,MOTRIN) 800 MG tablet Take 1 tablet (800 mg total) by mouth every 8 (eight) hours as needed. Patient not  taking: Reported on 02/20/2016 01/05/16   Charlestine Nighthristopher Lawyer, PA-C  oxyCODONE-acetaminophen (PERCOCET/ROXICET) 5-325 MG tablet Take 1 tablet by mouth every 6 (six) hours as needed for severe pain. Patient not taking: Reported on 02/20/2016 01/05/16   Charlestine Nighthristopher Lawyer, PA-C  predniSONE (DELTASONE) 50 MG tablet Take 1 tablet (50 mg total) by mouth daily with breakfast. Patient not taking: Reported on 07/19/2016 02/20/16   Charlestine Nighthristopher Lawyer, PA-C   Dg Ankle Complete Left  Result Date: 07/19/2016 CLINICAL DATA:  Twisting injury, ankle deformity, pain. Initial encounter. EXAM: LEFT ANKLE COMPLETE - 3+ VIEW COMPARISON:  06/28/2003. FINDINGS: There is a displaced fracture of the distal fibular metaphysis with approximately 1 shaft width lateral displacement of the distal fracture fragment. Lateral displacement of the talus and distal fibular fracture fragment by approximately 1/2 tibial shaft width. There may be a tiny fracture fragment off the medial talar dome. Nondisplaced posterior malleolar fracture. IMPRESSION: 1. Lateral and posterior malleolar ankle fracture dislocation. 2. Possible tiny fractures off the medial talar dome. Electronically Signed   By: Leanna BattlesMelinda  Blietz M.D.   On: 07/19/2016 11:29   - pertinent xrays, CT, MRI studies were reviewed and independently interpreted  Positive ROS: All other systems have been reviewed and were otherwise negative with the exception of those mentioned in the HPI and as above.  Physical Exam: General: Alert, no acute distress Cardiovascular: No pedal edema Respiratory: No cyanosis, no use of accessory musculature GI: No organomegaly, abdomen is soft and non-tender Skin: No lesions in the area of chief complaint Neurologic: Sensation intact distally Psychiatric: Patient is competent for consent with normal mood and affect Lymphatic: No axillary or cervical lymphadenopathy  MUSCULOSKELETAL:  - obvious closed deformity of the left ankle - foot wwp, NVI -  pain with movement  Assessment: Left ankle fracture dislocation  Plan: - closed reduced and splinted in ER under conscious sedation - will plan for ORIF this coming week - my office will contact patient for surgery - they understand r/b/a to surgery - NWB - strict elevation at times  Thank you for the consult and the opportunity to see Ms.  BingSmith  N. Michael Xu, MD [facility] [phone] 2:03 PM

## 2016-07-19 NOTE — ED Notes (Signed)
pts family member at bedside asked to only touch pt. With gloves on as he is fearful that we all have "something" that will affect pt.  Complied with family's request

## 2016-07-19 NOTE — ED Notes (Signed)
Patient transported to X-ray 

## 2016-07-19 NOTE — Sedation Documentation (Signed)
Ortho surgeon at the bedside assisting with procedure pt is not responding at this time VS WNL

## 2016-07-19 NOTE — ED Triage Notes (Signed)
Pt was walking down the stairs and than slipped twisting L ankle obvious deformity noted

## 2016-07-19 NOTE — ED Notes (Signed)
PA speaking with pt. Regarding pain medicines

## 2016-07-19 NOTE — ED Notes (Signed)
ED Provider at bedside discussing procedure regarding putting L ankle in to place pt awake and alert and in no distress

## 2016-07-19 NOTE — Sedation Documentation (Signed)
20 mg IV propofol given pt is responding to painful stimuli

## 2016-07-19 NOTE — Sedation Documentation (Signed)
 10mg  IV propofol given pt responding to painful stimuli

## 2016-07-21 ENCOUNTER — Other Ambulatory Visit (INDEPENDENT_AMBULATORY_CARE_PROVIDER_SITE_OTHER): Payer: Self-pay | Admitting: Orthopaedic Surgery

## 2016-07-21 ENCOUNTER — Encounter (HOSPITAL_BASED_OUTPATIENT_CLINIC_OR_DEPARTMENT_OTHER): Payer: Self-pay | Admitting: *Deleted

## 2016-07-21 DIAGNOSIS — S82842G Displaced bimalleolar fracture of left lower leg, subsequent encounter for closed fracture with delayed healing: Secondary | ICD-10-CM

## 2016-07-21 NOTE — Pre-Procedure Instructions (Signed)
To come for anesthesia airway evaluation. 

## 2016-07-22 ENCOUNTER — Ambulatory Visit (INDEPENDENT_AMBULATORY_CARE_PROVIDER_SITE_OTHER): Payer: Self-pay | Admitting: Orthopaedic Surgery

## 2016-07-22 ENCOUNTER — Telehealth (INDEPENDENT_AMBULATORY_CARE_PROVIDER_SITE_OTHER): Payer: Self-pay | Admitting: Orthopaedic Surgery

## 2016-07-22 ENCOUNTER — Other Ambulatory Visit (INDEPENDENT_AMBULATORY_CARE_PROVIDER_SITE_OTHER): Payer: Self-pay | Admitting: Orthopaedic Surgery

## 2016-07-22 NOTE — Pre-Procedure Instructions (Signed)
Anesthesia airway evaluation per Dr. Hart RochesterHollis; pt. OK to come for surgery.  All pt's questions answered by Dr. Hart RochesterHollis.

## 2016-07-22 NOTE — Anesthesia Preprocedure Evaluation (Signed)
Anesthesia Evaluation  Patient identified by MRN, date of birth, ID band Patient awake    Reviewed: Allergy & Precautions, H&P , NPO status , Patient's Chart, lab work & pertinent test results, reviewed documented beta blocker date and time   History of Anesthesia Complications (+) DIFFICULT IV STICK / SPECIAL LINE and history of anesthetic complications  Airway Mallampati: II  TM Distance: >3 FB Neck ROM: full    Dental  (+) Teeth Intact   Pulmonary neg pulmonary ROS,    Pulmonary exam normal breath sounds clear to auscultation       Cardiovascular negative cardio ROS   Rhythm:regular Rate:Normal     Neuro/Psych  Headaches, PSYCHIATRIC DISORDERS Anxiety Depression Bipolar Disorder Chronic back pain    GI/Hepatic negative GI ROS, Neg liver ROS,   Endo/Other  Morbid obesity  Renal/GU negative Renal ROS  Female GU complaint     Musculoskeletal  (+) Arthritis ,   Abdominal   Peds  Hematology  (+) anemia , Pt reports hgb of 7.6 is normal for her   Anesthesia Other Findings   Reproductive/Obstetrics negative OB ROS                             Anesthesia Physical  Anesthesia Plan  ASA: III  Anesthesia Plan: General and Regional   Post-op Pain Management: GA combined w/ Regional for post-op pain   Induction: Intravenous  Airway Management Planned: LMA  Additional Equipment:   Intra-op Plan:   Post-operative Plan: Extubation in OR  Informed Consent: I have reviewed the patients History and Physical, chart, labs and discussed the procedure including the risks, benefits and alternatives for the proposed anesthesia with the patient or authorized representative who has indicated his/her understanding and acceptance.   Dental advisory given  Plan Discussed with: CRNA  Anesthesia Plan Comments:         Anesthesia Quick Evaluation

## 2016-07-22 NOTE — Telephone Encounter (Signed)
Patient having surgery on L ankle tomorrow morning and is requesting that you provide a script for a foot tub and toilet lift seat.  Patient would like to pick up today.  Please call in when ready.

## 2016-07-22 NOTE — Telephone Encounter (Signed)
Please advise 

## 2016-07-22 NOTE — Telephone Encounter (Signed)
yes

## 2016-07-23 ENCOUNTER — Ambulatory Visit (HOSPITAL_COMMUNITY): Payer: Medicare Other

## 2016-07-23 ENCOUNTER — Encounter (HOSPITAL_BASED_OUTPATIENT_CLINIC_OR_DEPARTMENT_OTHER): Payer: Self-pay | Admitting: Anesthesiology

## 2016-07-23 ENCOUNTER — Ambulatory Visit (HOSPITAL_BASED_OUTPATIENT_CLINIC_OR_DEPARTMENT_OTHER): Payer: Medicare Other | Admitting: Anesthesiology

## 2016-07-23 ENCOUNTER — Encounter (HOSPITAL_BASED_OUTPATIENT_CLINIC_OR_DEPARTMENT_OTHER)
Admission: RE | Disposition: A | Discharge status: Home / Self Care | Payer: Self-pay | Source: Ambulatory Visit | Attending: Orthopaedic Surgery

## 2016-07-23 ENCOUNTER — Ambulatory Visit (HOSPITAL_COMMUNITY)
Admission: RE | Admit: 2016-07-23 | Discharge: 2016-07-23 | Disposition: A | Discharge status: Home / Self Care | Payer: Medicare Other | Source: Ambulatory Visit | Attending: Orthopaedic Surgery | Admitting: Orthopaedic Surgery

## 2016-07-23 DIAGNOSIS — S82842A Displaced bimalleolar fracture of left lower leg, initial encounter for closed fracture: Secondary | ICD-10-CM | POA: Diagnosis present

## 2016-07-23 DIAGNOSIS — X58XXXA Exposure to other specified factors, initial encounter: Secondary | ICD-10-CM | POA: Diagnosis not present

## 2016-07-23 DIAGNOSIS — S82842D Displaced bimalleolar fracture of left lower leg, subsequent encounter for closed fracture with routine healing: Secondary | ICD-10-CM | POA: Diagnosis not present

## 2016-07-23 DIAGNOSIS — Z6841 Body Mass Index (BMI) 40.0 and over, adult: Secondary | ICD-10-CM | POA: Insufficient documentation

## 2016-07-23 DIAGNOSIS — D649 Anemia, unspecified: Secondary | ICD-10-CM | POA: Diagnosis not present

## 2016-07-23 DIAGNOSIS — Z79899 Other long term (current) drug therapy: Secondary | ICD-10-CM | POA: Diagnosis not present

## 2016-07-23 DIAGNOSIS — Z4789 Encounter for other orthopedic aftercare: Secondary | ICD-10-CM

## 2016-07-23 DIAGNOSIS — S82842G Displaced bimalleolar fracture of left lower leg, subsequent encounter for closed fracture with delayed healing: Secondary | ICD-10-CM

## 2016-07-23 DIAGNOSIS — F909 Attention-deficit hyperactivity disorder, unspecified type: Secondary | ICD-10-CM | POA: Insufficient documentation

## 2016-07-23 DIAGNOSIS — F419 Anxiety disorder, unspecified: Secondary | ICD-10-CM | POA: Diagnosis not present

## 2016-07-23 DIAGNOSIS — S93439A Sprain of tibiofibular ligament of unspecified ankle, initial encounter: Secondary | ICD-10-CM | POA: Diagnosis not present

## 2016-07-23 DIAGNOSIS — F329 Major depressive disorder, single episode, unspecified: Secondary | ICD-10-CM | POA: Diagnosis not present

## 2016-07-23 HISTORY — DX: Other chronic pain: G89.29

## 2016-07-23 HISTORY — DX: Attention-deficit hyperactivity disorder, unspecified type: F90.9

## 2016-07-23 HISTORY — DX: Displaced bimalleolar fracture of unspecified lower leg, initial encounter for closed fracture: S82.843A

## 2016-07-23 HISTORY — PX: ORIF ANKLE FRACTURE: SHX5408

## 2016-07-23 HISTORY — DX: Pain in unspecified joint: M25.50

## 2016-07-23 LAB — POCT HEMOGLOBIN-HEMACUE: HEMOGLOBIN: 9.6 g/dL — AB (ref 12.0–15.0)

## 2016-07-23 SURGERY — OPEN REDUCTION INTERNAL FIXATION (ORIF) ANKLE FRACTURE
Anesthesia: Regional | Site: Ankle | Laterality: Left

## 2016-07-23 MED ORDER — FENTANYL CITRATE (PF) 100 MCG/2ML IJ SOLN
INTRAMUSCULAR | Status: AC
  Filled 2016-07-23: qty 2

## 2016-07-23 MED ORDER — LIDOCAINE 2% (20 MG/ML) 5 ML SYRINGE
INTRAMUSCULAR | Status: AC
  Filled 2016-07-23: qty 5

## 2016-07-23 MED ORDER — OXYCODONE HCL 5 MG/5ML PO SOLN: 5.0000 mg | Freq: Once | ORAL | Status: DC | PRN

## 2016-07-23 MED ORDER — FENTANYL CITRATE (PF) 100 MCG/2ML IJ SOLN
INTRAMUSCULAR | Status: DC | PRN
Start: 2016-07-23 — End: 2016-07-23
  Administered 2016-07-23: 50 ug via INTRAVENOUS
  Administered 2016-07-23: 25 ug via INTRAVENOUS

## 2016-07-23 MED ORDER — OXYCODONE HCL ER 10 MG PO T12A: 10.0000 mg | EXTENDED_RELEASE_TABLET | Freq: Two times a day (BID) | ORAL | 0 refills | Status: DC

## 2016-07-23 MED ORDER — LIDOCAINE HCL (CARDIAC) 20 MG/ML IV SOLN
INTRAVENOUS | Status: DC | PRN
  Administered 2016-07-23: 30 mg via INTRAVENOUS

## 2016-07-23 MED ORDER — BUPIVACAINE-EPINEPHRINE (PF) 0.5% -1:200000 IJ SOLN
INTRAMUSCULAR | Status: DC | PRN
  Administered 2016-07-23: 20 mL via PERINEURAL

## 2016-07-23 MED ORDER — ONDANSETRON HCL 4 MG/2ML IJ SOLN
INTRAMUSCULAR | Status: AC
  Filled 2016-07-23: qty 2

## 2016-07-23 MED ORDER — LACTATED RINGERS IV SOLN
INTRAVENOUS | Status: DC
  Administered 2016-07-23 (×2): via INTRAVENOUS

## 2016-07-23 MED ORDER — CEFAZOLIN SODIUM-DEXTROSE 2-4 GM/100ML-% IV SOLN
2.0000 g | INTRAVENOUS | Status: AC
  Administered 2016-07-23: 3 g via INTRAVENOUS

## 2016-07-23 MED ORDER — MIDAZOLAM HCL 2 MG/2ML IJ SOLN
1.0000 mg | INTRAMUSCULAR | Status: DC | PRN
  Administered 2016-07-23: 2 mg via INTRAVENOUS

## 2016-07-23 MED ORDER — SENNOSIDES-DOCUSATE SODIUM 8.6-50 MG PO TABS: 1.0000 | ORAL_TABLET | Freq: Every evening | ORAL | 1 refills | Status: DC | PRN

## 2016-07-23 MED ORDER — FENTANYL CITRATE (PF) 100 MCG/2ML IJ SOLN
50.0000 ug | INTRAMUSCULAR | Status: DC | PRN
  Administered 2016-07-23: 100 ug via INTRAVENOUS

## 2016-07-23 MED ORDER — MIDAZOLAM HCL 5 MG/5ML IJ SOLN
INTRAMUSCULAR | Status: DC | PRN
  Administered 2016-07-23: 2 mg via INTRAVENOUS

## 2016-07-23 MED ORDER — CEFAZOLIN SODIUM-DEXTROSE 2-4 GM/100ML-% IV SOLN
INTRAVENOUS | Status: AC
  Filled 2016-07-23: qty 100

## 2016-07-23 MED ORDER — PROPOFOL 10 MG/ML IV BOLUS
INTRAVENOUS | Status: AC
  Filled 2016-07-23: qty 20

## 2016-07-23 MED ORDER — PROMETHAZINE HCL 25 MG PO TABS: 25.0000 mg | ORAL_TABLET | Freq: Four times a day (QID) | ORAL | 1 refills | Status: DC | PRN

## 2016-07-23 MED ORDER — ONDANSETRON HCL 4 MG/2ML IJ SOLN
INTRAMUSCULAR | Status: DC | PRN
  Administered 2016-07-23: 4 mg via INTRAVENOUS

## 2016-07-23 MED ORDER — MIDAZOLAM HCL 2 MG/2ML IJ SOLN
INTRAMUSCULAR | Status: AC
  Filled 2016-07-23: qty 2

## 2016-07-23 MED ORDER — PROPOFOL 500 MG/50ML IV EMUL
INTRAVENOUS | Status: AC
  Filled 2016-07-23: qty 50

## 2016-07-23 MED ORDER — ASPIRIN EC 325 MG PO TBEC: 325.0000 mg | DELAYED_RELEASE_TABLET | Freq: Two times a day (BID) | ORAL | 0 refills | Status: DC

## 2016-07-23 MED ORDER — ONDANSETRON HCL 4 MG PO TABS: 4.0000 mg | ORAL_TABLET | Freq: Three times a day (TID) | ORAL | 0 refills | Status: DC | PRN

## 2016-07-23 MED ORDER — SCOPOLAMINE 1 MG/3DAYS TD PT72: 1.0000 | MEDICATED_PATCH | Freq: Once | TRANSDERMAL | Status: DC | PRN

## 2016-07-23 MED ORDER — DEXAMETHASONE SODIUM PHOSPHATE 10 MG/ML IJ SOLN
INTRAMUSCULAR | Status: DC | PRN
  Administered 2016-07-23: 10 mg via INTRAVENOUS

## 2016-07-23 MED ORDER — OXYCODONE HCL 5 MG PO TABS: 5.0000 mg | ORAL_TABLET | ORAL | 0 refills | Status: DC | PRN

## 2016-07-23 MED ORDER — CEFAZOLIN IN D5W 1 GM/50ML IV SOLN
INTRAVENOUS | Status: AC
  Filled 2016-07-23: qty 50

## 2016-07-23 MED ORDER — DEXAMETHASONE SODIUM PHOSPHATE 10 MG/ML IJ SOLN
INTRAMUSCULAR | Status: AC
  Filled 2016-07-23: qty 1

## 2016-07-23 MED ORDER — HYDROMORPHONE HCL 1 MG/ML IJ SOLN
INTRAMUSCULAR | Status: AC
  Filled 2016-07-23: qty 1

## 2016-07-23 MED ORDER — HYDROMORPHONE HCL 1 MG/ML IJ SOLN
0.2500 mg | INTRAMUSCULAR | Status: DC | PRN
  Administered 2016-07-23: 0.5 mg via INTRAVENOUS

## 2016-07-23 MED ORDER — PROMETHAZINE HCL 25 MG/ML IJ SOLN: 6.2500 mg | INTRAMUSCULAR | Status: DC | PRN

## 2016-07-23 MED ORDER — METHOCARBAMOL 750 MG PO TABS: 750.0000 mg | ORAL_TABLET | Freq: Two times a day (BID) | ORAL | 0 refills | Status: DC | PRN

## 2016-07-23 MED ORDER — MEPERIDINE HCL 25 MG/ML IJ SOLN: 6.2500 mg | INTRAMUSCULAR | Status: DC | PRN

## 2016-07-23 MED ORDER — PROPOFOL 10 MG/ML IV BOLUS
INTRAVENOUS | Status: DC | PRN
  Administered 2016-07-23: 200 mg via INTRAVENOUS

## 2016-07-23 MED ORDER — OXYCODONE HCL 5 MG PO TABS: 5.0000 mg | ORAL_TABLET | Freq: Once | ORAL | Status: DC | PRN

## 2016-07-23 MED ORDER — BUPIVACAINE HCL (PF) 0.5 % IJ SOLN
INTRAMUSCULAR | Status: DC | PRN
  Administered 2016-07-23: 10 mL

## 2016-07-23 SURGICAL SUPPLY — 84 items
BANDAGE ACE 4X5 VEL STRL LF (GAUZE/BANDAGES/DRESSINGS) IMPLANT
BANDAGE ACE 6X5 VEL STRL LF (GAUZE/BANDAGES/DRESSINGS) ×3 IMPLANT
BANDAGE ESMARK 6X9 LF (GAUZE/BANDAGES/DRESSINGS) ×1 IMPLANT
BIT DRILL 3.5X122MM AO FIT (BIT) ×2 IMPLANT
BLADE HEX COATED 2.75 (ELECTRODE) ×3 IMPLANT
BLADE SURG 15 STRL LF DISP TIS (BLADE) ×2 IMPLANT
BLADE SURG 15 STRL SS (BLADE) ×6
BNDG CMPR 9X6 STRL LF SNTH (GAUZE/BANDAGES/DRESSINGS) ×1
BNDG COHESIVE 6X5 TAN STRL LF (GAUZE/BANDAGES/DRESSINGS) ×3 IMPLANT
BNDG ESMARK 6X9 LF (GAUZE/BANDAGES/DRESSINGS) ×3
BRUSH SCRUB EZ PLAIN DRY (MISCELLANEOUS) ×3 IMPLANT
CANISTER SUCT 1200ML W/VALVE (MISCELLANEOUS) ×3 IMPLANT
COVER BACK TABLE 60X90IN (DRAPES) ×3 IMPLANT
COVER MAYO STAND STRL (DRAPES) IMPLANT
CUFF TOURNIQUET SINGLE 34IN LL (TOURNIQUET CUFF) IMPLANT
DECANTER SPIKE VIAL GLASS SM (MISCELLANEOUS) IMPLANT
DRAPE C-ARM 42X72 X-RAY (DRAPES) ×3 IMPLANT
DRAPE C-ARMOR (DRAPES) ×3 IMPLANT
DRAPE EXTREMITY T 121X128X90 (DRAPE) ×3 IMPLANT
DRAPE IMP U-DRAPE 54X76 (DRAPES) ×3 IMPLANT
DRAPE SURG 17X23 STRL (DRAPES) ×6 IMPLANT
DRILL 2.6X122MM WL AO SHAFT (BIT) ×2 IMPLANT
DRSG PAD ABDOMINAL 8X10 ST (GAUZE/BANDAGES/DRESSINGS) ×6 IMPLANT
DURAPREP 26ML APPLICATOR (WOUND CARE) ×6 IMPLANT
ELECT REM PT RETURN 9FT ADLT (ELECTROSURGICAL) ×3
ELECTRODE REM PT RTRN 9FT ADLT (ELECTROSURGICAL) ×1 IMPLANT
GAUZE SPONGE 4X4 12PLY STRL (GAUZE/BANDAGES/DRESSINGS) ×3 IMPLANT
GAUZE XEROFORM 1X8 LF (GAUZE/BANDAGES/DRESSINGS) ×3 IMPLANT
GLOVE SKINSENSE NS SZ7.5 (GLOVE) ×2
GLOVE SKINSENSE STRL SZ7.5 (GLOVE) ×1 IMPLANT
GLOVE SURG SYN 7.5  E (GLOVE) ×2
GLOVE SURG SYN 7.5 E (GLOVE) ×1 IMPLANT
GLOVE SURG SYN 7.5 PF PI (GLOVE) ×1 IMPLANT
GOWN STRL REIN XL XLG (GOWN DISPOSABLE) ×3 IMPLANT
GOWN STRL REUS W/ TWL LRG LVL3 (GOWN DISPOSABLE) ×1 IMPLANT
GOWN STRL REUS W/TWL LRG LVL3 (GOWN DISPOSABLE) ×3
K-WIRE ORTHOPEDIC 1.4X150L (WIRE) ×6
KWIRE ORTHOPEDIC 1.4X150L (WIRE) IMPLANT
NEEDLE HYPO 22GX1.5 SAFETY (NEEDLE) IMPLANT
NS IRRIG 1000ML POUR BTL (IV SOLUTION) ×3 IMPLANT
PACK BASIN DAY SURGERY FS (CUSTOM PROCEDURE TRAY) ×3 IMPLANT
PAD CAST 3X4 CTTN HI CHSV (CAST SUPPLIES) IMPLANT
PAD CAST 4YDX4 CTTN HI CHSV (CAST SUPPLIES) IMPLANT
PADDING CAST COTTON 3X4 STRL (CAST SUPPLIES)
PADDING CAST COTTON 4X4 STRL (CAST SUPPLIES)
PADDING CAST COTTON 6X4 STRL (CAST SUPPLIES) IMPLANT
PADDING CAST SYN 6 (CAST SUPPLIES) ×2
PADDING CAST SYNTHETIC 4 (CAST SUPPLIES) ×2
PADDING CAST SYNTHETIC 4X4 STR (CAST SUPPLIES) ×1 IMPLANT
PADDING CAST SYNTHETIC 6X4 NS (CAST SUPPLIES) ×1 IMPLANT
PENCIL BUTTON HOLSTER BLD 10FT (ELECTRODE) ×3 IMPLANT
PLATE FIBULA 5H (Plate) ×2 IMPLANT
SCREW BONE 14MMX3.5MM (Screw) ×8 IMPLANT
SCREW BONE 18 (Screw) ×2 IMPLANT
SCREW BONE 3.5X16MM (Screw) ×2 IMPLANT
SCREW BONE 3.5X20MM (Screw) ×2 IMPLANT
SCREW BONE 3.5X44 (Screw) ×2 IMPLANT
SCREW LOCK 3.5X14 (Screw) ×2 IMPLANT
SCREW LOCKING 3.5X12 (Screw) ×2 IMPLANT
SCREW LOCKING 3.5X16MM (Screw) ×2 IMPLANT
SCREW NL 3.5X42MM (Screw) ×2 IMPLANT
SCREW NONLOCK 48MM (Screw) ×2 IMPLANT
SHEET MEDIUM DRAPE 40X70 STRL (DRAPES) ×3 IMPLANT
SLEEVE SCD COMPRESS KNEE MED (MISCELLANEOUS) ×3 IMPLANT
SPLINT FIBERGLASS 4X30 (CAST SUPPLIES) IMPLANT
SPONGE LAP 18X18 X RAY DECT (DISPOSABLE) ×3 IMPLANT
SUCTION FRAZIER HANDLE 10FR (MISCELLANEOUS) ×2
SUCTION TUBE FRAZIER 10FR DISP (MISCELLANEOUS) ×1 IMPLANT
SUT ETHILON 3 0 PS 1 (SUTURE) IMPLANT
SUT VIC AB 0 CT1 27 (SUTURE)
SUT VIC AB 0 CT1 27XBRD ANBCTR (SUTURE) IMPLANT
SUT VIC AB 2-0 CT1 27 (SUTURE) ×3
SUT VIC AB 2-0 CT1 TAPERPNT 27 (SUTURE) ×1 IMPLANT
SUT VIC AB 3-0 SH 27 (SUTURE)
SUT VIC AB 3-0 SH 27X BRD (SUTURE) IMPLANT
SYR BULB 3OZ (MISCELLANEOUS) ×3 IMPLANT
SYR CONTROL 10ML LL (SYRINGE) IMPLANT
TOWEL OR 17X24 6PK STRL BLUE (TOWEL DISPOSABLE) ×3 IMPLANT
TOWEL OR NON WOVEN STRL DISP B (DISPOSABLE) ×3 IMPLANT
TRAY DSU PREP LF (CUSTOM PROCEDURE TRAY) ×3 IMPLANT
TUBE CONNECTING 20'X1/4 (TUBING) ×1
TUBE CONNECTING 20X1/4 (TUBING) ×2 IMPLANT
UNDERPAD 30X30 (UNDERPADS AND DIAPERS) ×3 IMPLANT
YANKAUER SUCT BULB TIP NO VENT (SUCTIONS) ×3 IMPLANT

## 2016-07-23 NOTE — Op Note (Signed)
   Date of Surgery: 07/23/2016  INDICATIONS: [name] is a 41 y.o.-year-old female who sustained a left ankle fracture; she was indicated for open reduction and internal fixation due to the displaced nature of the articular fracture and came to the operating room today for this procedure. The patient did consent to the procedure after discussion of the risks and benefits.  PREOPERATIVE DIAGNOSIS: left bimalleolar (posterior and lateral malleolus) ankle fracture with disruption of syndesmosis  POSTOPERATIVE DIAGNOSIS: Same.  PROCEDURE:  1. Open treatment of left ankle fracture with internal fixation. Bimalleolar CPT [id number] 2. Open treatment of left ankle syndesmosis with internal fixation. CPT [id number]  SURGEON: N. Glee ArvinMichael Xu, M.D.  ASSIST: none.  ANESTHESIA:  general  TOURNIQUET TIME: less than 60 mins  IV FLUIDS AND URINE: See anesthesia.  ESTIMATED BLOOD LOSS: minimal mL.  IMPLANTS: [employer] Variax 5 hole distal fibula plate  COMPLICATIONS: None.  DESCRIPTION OF PROCEDURE: The patient was brought to the operating room and placed supine on the operating table.  The patient had been signed prior to the procedure and this was documented. The patient had the anesthesia placed by the anesthesiologist.  A nonsterile tourniquet was placed on the upper thigh.  The prep verification and incision time-outs were performed to confirm that this was the correct patient, site, side and location. The patient had an SCD on the opposite lower extremity. The patient did receive antibiotics prior to the incision and was re-dosed during the procedure as needed at indicated intervals.  The patient had the lower extremity prepped and draped in the standard surgical fashion.  The extremity was exsanguinated using an esmarch bandage and the tourniquet was inflated to 300 mm Hg.  A lateral incision over the distal fibula was created. Dissection was carried down to the fibula. Subperiosteal elevation was performed.  The fracture was exposed. There was significant comminution of the fracture which did not allow for a lag screw. Once the fracture was overall reduced and aligned I placed a precontoured plate on the lateral aspect of the fibula at the appropriate position. I then used nonlocking and locking screws both above and below the fracture in standard AO technique. I then performed a stress exam of her syndesmosis which did show widening of the medial clear space. The syndesmosis was then reduced using a large periarticular clamp. 2 syndesmotic screws were then placed parallel to the joint using fluoroscopic guidance. The wound was then thoroughly irrigated and closed in layer fashion using 0 Vicryl, 2-0 Vicryl, 3-0 nylon. Sterile dressings were applied. Patient tolerated procedure well and no immediate competitions.  POSTOPERATIVE PLAN: [name] will remain nonweightbearing on this leg for approximately 6 weeks; [name] will return for suture removal in 2 weeks.  He will be immobilized in a short leg splint and then transitioned to a CAM walker at his first follow up appointment.  [name] will receive DVT prophylaxis based on other medications, activity level, and risk ratio of bleeding to thrombosis.  Mayra ReelN. Michael Xu, MD [facility] [phone] 10:10 AM

## 2016-07-23 NOTE — Anesthesia Postprocedure Evaluation (Signed)
Anesthesia Post Note  Patient: [name]  Procedure(s) Performed: Procedure(s) (LRB): OPEN REDUCTION INTERNAL FIXATION (ORIF) LEFT BIMALLEOLAR ANKLE FRACTURE (Left)  Patient location during evaluation: PACU Anesthesia Type: Regional and General Level of consciousness: sedated and patient cooperative Pain management: pain level controlled Vital Signs Assessment: post-procedure vital signs reviewed and stable Respiratory status: spontaneous breathing Cardiovascular status: stable Anesthetic complications: no       Last Vitals:  Vitals:   07/23/16 1110 07/23/16 1139  BP: 126/76 114/73  Pulse: 76 78  Resp: 14 16  Temp:  36.4 C    Last Pain:  Vitals:   07/23/16 1139  TempSrc: Oral  PainSc: 0-No pain                 Lewie LoronJohn Montura Ducre

## 2016-07-23 NOTE — H&P (Signed)
PREOPERATIVE H&P  Chief Complaint: left bimalleolar ankle fracture  HPI: [name] is a 41 y.o. female who presents for surgical treatment of left bimalleolar ankle fracture.  She denies any changes in medical history.  Past Medical History:  Diagnosis Date  . ADHD   . Anemia   . Anxiety   . Arthralgia   . Bimalleolar ankle fracture 07/19/2016   left  . Bipolar disorder (HCC)   . Chronic pain    hands, knees, back  . Depression   . Migraines   . Obesity    Past Surgical History:  Procedure Laterality Date  . CERVICAL CONIZATION W/BX  10/30/2011   Procedure: CONIZATION CERVIX WITH BIOPSY;  Surgeon: Kathreen Cosier, MD;  Location: [facility];  Service: Gynecology;  Laterality: N/A;  . DILATION AND CURETTAGE OF UTERUS  10/30/2011   Procedure: DILATATION AND CURETTAGE;  Surgeon: Kathreen Cosier, MD;  Location: [facility];  Service: Gynecology;  Laterality: N/A;  . DILITATION & CURRETTAGE/HYSTROSCOPY WITH HYDROTHERMAL ABLATION N/A 12/14/2013   Procedure: DILATATION & CURETTAGE/HYSTEROSCOPY WITH HYDROTHERMAL ABLATION;  Surgeon: Kathreen Cosier, MD;  Location: [facility];  Service: Gynecology;  Laterality: N/A;  . TUBAL LIGATION  07/12/2003  . WISDOM TOOTH EXTRACTION     Social History   Social History  . Marital status: Single    Spouse name: N/A  . Number of children: N/A  . Years of education: N/A   Social History Main Topics  . Smoking status: Never Smoker  . Smokeless tobacco: Never Used  . Alcohol use No  . Drug use: No  . Sexual activity: Yes    Partners: Male    Birth control/ protection: Surgical   Other Topics Concern  . None   Social History Narrative   Lives with husband and three kids.    Family History  Problem Relation Age of Onset  . Lupus Sister   . Diabetes type II    . Stroke     Allergies  Allergen Reactions  . Buspar [Buspirone] Other (See Comments)    "MADE ME REAL JITTERY"   Prior to Admission medications   Medication Sig Start  Date End Date Taking? Authorizing Provider  alprazolam Prudy Feeler) 2 MG tablet Take 2 mg by mouth 2 (two) times daily as needed for anxiety.   Yes Historical Provider, MD  amphetamine-dextroamphetamine (ADDERALL) 30 MG tablet Take 30 mg by mouth 2 (two) times daily. 01/02/16  Yes Historical Provider, MD  celecoxib (CELEBREX) 200 MG capsule Take 200 mg by mouth daily. 07/07/16  Yes Historical Provider, MD  citalopram (CELEXA) 40 MG tablet Take 40 mg by mouth daily. 12/24/15  Yes Historical Provider, MD  Fe Cbn-Fe Gluc-FA-B12-C-DSS (FERRALET 90) 90-1 MG TABS Take 1 tablet by mouth daily. 06/08/16  Yes Historical Provider, MD  ibuprofen (ADVIL,MOTRIN) 800 MG tablet Take 800 mg by mouth every 8 (eight) hours as needed.   Yes Historical Provider, MD  mirtazapine (REMERON) 30 MG tablet Take 30 mg by mouth daily. 12/24/15  Yes Historical Provider, MD  Oxycodone HCl 10 MG TABS Take 10 mg by mouth 3 (three) times daily. 07/15/16  Yes Historical Provider, MD     Positive ROS: All other systems have been reviewed and were otherwise negative with the exception of those mentioned in the HPI and as above.  Physical Exam: General: Alert, no acute distress Cardiovascular: No pedal edema Respiratory: No cyanosis, no use of accessory musculature GI: abdomen soft Skin: No lesions in  the area of chief complaint Neurologic: Sensation intact distally Psychiatric: Patient is competent for consent with normal mood and affect Lymphatic: no lymphedema  MUSCULOSKELETAL: exam stable  Assessment: left bimalleolar ankle fracture  Plan: Plan for Procedure(s): OPEN REDUCTION INTERNAL FIXATION (ORIF) LEFT BIMALLEOLAR ANKLE FRACTURE  The risks benefits and alternatives were discussed with the patient including but not limited to the risks of nonoperative treatment, versus surgical intervention including infection, bleeding, nerve injury,  blood clots, cardiopulmonary complications, morbidity, mortality, among others, and they  were willing to proceed.   Glee ArvinMichael Xu, MD   07/23/2016 7:31 AM

## 2016-07-23 NOTE — Telephone Encounter (Signed)
Order made. Ready for pick up.

## 2016-07-23 NOTE — Anesthesia Procedure Notes (Addendum)
Anesthesia Regional Block: Popliteal block   Pre-Anesthetic Checklist: ,, timeout performed, Correct Patient, Correct Site, Correct Laterality, Correct Procedure, Correct Position, site marked, Risks and benefits discussed,  Surgical consent,  Pre-op evaluation,  At surgeon's request and post-op pain management  Laterality: Left  Prep: chloraprep       Needles:  Injection technique: Single-shot  Needle Type: Stimiplex     Needle Length: 10cm  Needle Gauge: 21     Additional Needles:   Procedures: ultrasound guided,,,,,,,,  Motor weakness within 5 minutes.  Narrative:  Start time: 07/23/2016 8:15 AM End time: 07/23/2016 8:25 AM Injection made incrementally with aspirations every 5 mL.  Performed by: Personally  Anesthesiologist: Lewie LoronGERMEROTH, Kiaya Haliburton  Additional Notes: Nerve located and needle positioned with direct ultrasound guidance. Good perineural spread. Patient tolerated well.

## 2016-07-23 NOTE — Anesthesia Procedure Notes (Signed)
Procedure Name: LMA Insertion Date/Time: 07/23/2016 8:41 AM Performed by: Genevieve NorlanderLINKA, [name] Pre-anesthesia Checklist: Patient identified, Emergency Drugs available, Suction available, Patient being monitored and Timeout performed Patient Re-evaluated:Patient Re-evaluated prior to inductionOxygen Delivery Method: Circle system utilized Preoxygenation: Pre-oxygenation with 100% oxygen Intubation Type: IV induction Ventilation: Mask ventilation without difficulty LMA: LMA inserted LMA Size: 4.0 Number of attempts: 1 Airway Equipment and Method: Bite block Placement Confirmation: positive ETCO2 Tube secured with: Tape Dental Injury: Teeth and Oropharynx as per pre-operative assessment

## 2016-07-23 NOTE — Discharge Instructions (Signed)
° ° °  1. Keep splint clean and dry 2. Elevate foot above level of the heart 3. Take aspirin to prevent blood clots 4. Take pain meds as needed 5. Strict non weight bearing to operative extremity  Call your surgeon if you experience:   1.  Fever over 101.0. 2.  Inability to urinate. 3.  Nausea and/or vomiting. 4.  Extreme swelling or bruising at the surgical site. 5.  Continued bleeding from the incision. 6.  Increased pain, redness or drainage from the incision. 7.  Problems related to your pain medication. 8.  Any problems and/or concernsRegional Anesthesia Blocks  1. Numbness or the inability to move the "blocked" extremity may last from 3-48 hours after placement. The length of time depends on the medication injected and your individual response to the medication. If the numbness is not going away after 48 hours, call your surgeon.  2. The extremity that is blocked will need to be protected until the numbness is gone and the  Strength has returned. Because you cannot feel it, you will need to take extra care to avoid injury. Because it may be weak, you may have difficulty moving it or using it. You may not know what position it is in without looking at it while the block is in effect.  3. For blocks in the legs and feet, returning to weight bearing and walking needs to be done carefully. You will need to wait until the numbness is entirely gone and the strength has returned. You should be able to move your leg and foot normally before you try and bear weight or walk. You will need someone to be with you when you first try to ensure you do not fall and possibly risk injury.  4. Bruising and tenderness at the needle site are common side effects and will resolve in a few days.  5. Persistent numbness or new problems with movement should be communicated to the surgeon or the [facility] [phone])/ [facility] [phone]). Post Anesthesia Home Care  Instructions  Activity: Get plenty of rest for the remainder of the day. A responsible adult should stay with you for 24 hours following the procedure.  For the next 24 hours, DO NOT: -Drive a car -Advertising copywriterperate machinery -Drink alcoholic beverages -Take any medication unless instructed by your physician -Make any legal decisions or sign important papers.  Meals: Start with liquid foods such as gelatin or soup. Progress to regular foods as tolerated. Avoid greasy, spicy, heavy foods. If nausea and/or vomiting occur, drink only clear liquids until the nausea and/or vomiting subsides. Call your physician if vomiting continues.  Special Instructions/Symptoms: Your throat may feel dry or sore from the anesthesia or the breathing tube placed in your throat during surgery. If this causes discomfort, gargle with warm salt water. The discomfort should disappear within 24 hours.  If you had a scopolamine patch placed behind your ear for the management of post- operative nausea and/or vomiting:  1. The medication in the patch is effective for 72 hours, after which it should be removed.  Wrap patch in a tissue and discard in the trash. Wash hands thoroughly with soap and water. 2. You may remove the patch earlier than 72 hours if you experience unpleasant side effects which may include dry mouth, dizziness or visual disturbances. 3. Avoid touching the patch. Wash your hands with soap and water after contact with the patch.

## 2016-07-23 NOTE — Progress Notes (Signed)
Assisted Dr. Germeroth with left, ultrasound guided, popliteal block. Side rails up, monitors on throughout procedure. See vital signs in flow sheet. Tolerated Procedure well. 

## 2016-07-23 NOTE — Transfer of Care (Signed)
Immediate Anesthesia Transfer of Care Note  Patient: [name]  Procedure(s) Performed: Procedure(s): OPEN REDUCTION INTERNAL FIXATION (ORIF) LEFT BIMALLEOLAR ANKLE FRACTURE (Left)  Patient Location: PACU  Anesthesia Type:GA combined with regional for post-op pain  Level of Consciousness: awake and patient cooperative  Airway & Oxygen Therapy: Patient Spontanous Breathing and Patient connected to face mask oxygen  Post-op Assessment: Report given to RN and Post -op Vital signs reviewed and stable  Post vital signs: Reviewed and stable  Last Vitals:  Vitals:   07/23/16 0824 07/23/16 1013  BP:    Pulse: 82 76  Resp: (!) 22 11  Temp:      Last Pain:  Vitals:   07/23/16 0720  TempSrc: Oral  PainSc: 7       Patients Stated Pain Goal: 4 (07/23/16 0720)  Complications: No apparent anesthesia complications

## 2016-07-23 NOTE — Anesthesia Procedure Notes (Signed)
Anesthesia Regional Block: Nerve block type: Saphenous nerve block.   Pre-Anesthetic Checklist: ,, timeout performed, Correct Patient, Correct Site, Correct Laterality, Correct Procedure, Correct Position, site marked, Risks and benefits discussed,  Surgical consent,  Pre-op evaluation,  At surgeon's request and post-op pain management  Laterality: Left  Prep: chloraprep       Needles:  Injection technique: Single-shot  Needle Type: Stimiplex     Needle Length: 9cm  Needle Gauge: 21     Additional Needles:   Narrative:  Start time: 07/23/2016 10:45 AM End time: 07/23/2016 10:55 AM Injection made incrementally with aspirations every 5 mL.  Performed by: Personally  Anesthesiologist: Lewie LoronGERMEROTH, Janya Eveland  Additional Notes: BP cuff, EKG monitors applied. Sedation begun. Artery and nerve location verified with U/S and anesthetic injected incrementally, slowly, and after negative aspirations under direct u/s guidance. Good fascial /perineural spread. Tolerated well.

## 2016-07-24 ENCOUNTER — Telehealth (INDEPENDENT_AMBULATORY_CARE_PROVIDER_SITE_OTHER): Payer: Self-pay | Admitting: Orthopaedic Surgery

## 2016-07-24 ENCOUNTER — Encounter (HOSPITAL_BASED_OUTPATIENT_CLINIC_OR_DEPARTMENT_OTHER): Payer: Self-pay | Admitting: Orthopaedic Surgery

## 2016-07-24 NOTE — Telephone Encounter (Signed)
Patient called asking for a boot after her surgery that she had yesterday. Also had a few questions about a toilet seat. Cb # [phone] Wants to pick up the boot from [facility] on [address]

## 2016-07-24 NOTE — Telephone Encounter (Signed)
That's fine

## 2016-07-24 NOTE — Telephone Encounter (Signed)
Okay to get boot?

## 2016-07-24 NOTE — Telephone Encounter (Signed)
Called pt & advised on message

## 2016-07-29 ENCOUNTER — Emergency Department (HOSPITAL_COMMUNITY)
Admission: EM | Admit: 2016-07-29 | Discharge: 2016-07-29 | Disposition: A | Discharge status: Home / Self Care | Payer: Medicare Other | Attending: Emergency Medicine | Admitting: Emergency Medicine

## 2016-07-29 ENCOUNTER — Emergency Department (HOSPITAL_COMMUNITY): Payer: Medicare Other

## 2016-07-29 ENCOUNTER — Encounter (HOSPITAL_COMMUNITY): Payer: Self-pay

## 2016-07-29 DIAGNOSIS — F909 Attention-deficit hyperactivity disorder, unspecified type: Secondary | ICD-10-CM | POA: Diagnosis not present

## 2016-07-29 DIAGNOSIS — Z7982 Long term (current) use of aspirin: Secondary | ICD-10-CM | POA: Insufficient documentation

## 2016-07-29 DIAGNOSIS — Z79899 Other long term (current) drug therapy: Secondary | ICD-10-CM | POA: Diagnosis not present

## 2016-07-29 DIAGNOSIS — R1110 Vomiting, unspecified: Secondary | ICD-10-CM

## 2016-07-29 DIAGNOSIS — R112 Nausea with vomiting, unspecified: Secondary | ICD-10-CM | POA: Diagnosis present

## 2016-07-29 LAB — COMPREHENSIVE METABOLIC PANEL
ALBUMIN: 3.1 g/dL — AB (ref 3.5–5.0)
ALT: 12 U/L — AB (ref 14–54)
AST: 14 U/L — AB (ref 15–41)
Alkaline Phosphatase: 62 U/L (ref 38–126)
Anion gap: 8 (ref 5–15)
BUN: 8 mg/dL (ref 6–20)
CHLORIDE: 102 mmol/L (ref 101–111)
CO2: 26 mmol/L (ref 22–32)
CREATININE: 0.72 mg/dL (ref 0.44–1.00)
Calcium: 9.2 mg/dL (ref 8.9–10.3)
GFR calc Af Amer: >60 mL/min (ref >60)
GFR calc non Af Amer: >60 mL/min (ref >60)
GLUCOSE: 128 mg/dL — AB (ref 65–99)
Potassium: 4.2 mmol/L (ref 3.5–5.1)
SODIUM: 136 mmol/L (ref 135–145)
Total Bilirubin: 0.4 mg/dL (ref 0.3–1.2)
Total Protein: 7 g/dL (ref 6.5–8.1)

## 2016-07-29 LAB — CBC
HCT: 34.9 % — ABNORMAL LOW (ref 36.0–46.0)
Hemoglobin: 10.5 g/dL — ABNORMAL LOW (ref 12.0–15.0)
MCH: 18.8 pg — AB (ref 26.0–34.0)
MCHC: 30.1 g/dL (ref 30.0–36.0)
MCV: 62.5 fL — AB (ref 78.0–100.0)
PLATELETS: 242 10*3/uL (ref 150–400)
RBC: 5.58 MIL/uL — ABNORMAL HIGH (ref 3.87–5.11)
RDW: 18.2 % — AB (ref 11.5–15.5)
WBC: 8.9 10*3/uL (ref 4.0–10.5)

## 2016-07-29 LAB — LIPASE, BLOOD: LIPASE: 13 U/L (ref 11–51)

## 2016-07-29 MED ORDER — ONDANSETRON 4 MG PO TBDP: 4.0000 mg | ORAL_TABLET | Freq: Three times a day (TID) | ORAL | 0 refills | Status: DC | PRN

## 2016-07-29 MED ORDER — SODIUM CHLORIDE 0.9 % IV BOLUS (SEPSIS)
1000.0000 mL | Freq: Once | INTRAVENOUS | Status: AC
  Administered 2016-07-29: 1000 mL via INTRAVENOUS

## 2016-07-29 MED ORDER — KETOROLAC TROMETHAMINE 60 MG/2ML IM SOLN
30.0000 mg | Freq: Once | INTRAMUSCULAR | Status: AC
  Administered 2016-07-29: 30 mg via INTRAMUSCULAR
  Filled 2016-07-29: qty 2

## 2016-07-29 MED ORDER — ONDANSETRON 4 MG PO TBDP
ORAL_TABLET | ORAL | Status: AC
  Administered 2016-07-29: 4 mg via ORAL
  Filled 2016-07-29: qty 1

## 2016-07-29 MED ORDER — HYDROMORPHONE HCL 2 MG/ML IJ SOLN
1.0000 mg | Freq: Once | INTRAMUSCULAR | Status: AC
  Administered 2016-07-29: 1 mg via INTRAVENOUS
  Filled 2016-07-29: qty 1

## 2016-07-29 MED ORDER — ONDANSETRON 4 MG PO TBDP
4.0000 mg | ORAL_TABLET | Freq: Once | ORAL | Status: AC
  Administered 2016-07-29: 4 mg via ORAL

## 2016-07-29 NOTE — ED Notes (Signed)
Patient transported to X-ray 

## 2016-07-29 NOTE — ED Notes (Signed)
Pt reminded of the need of a urine sample. Pt helped to restroom to provide sample, wipes , pads and a commode urine measurer given to pt and set up in toilet. Pt urinated into commode and did not urinate in the urine measurer. RN notified. Pt helped back to room and situated.

## 2016-07-29 NOTE — ED Notes (Signed)
Pt unable to void at this time. 

## 2016-07-29 NOTE — ED Triage Notes (Signed)
Per EMS: Pt complaining of N/V x 1 day. Pt states 4 episodes today. Pt denies any diarrhea or abdominal pain. Pt states recent L ankle fx. Pt states take percocet 15mg  q4hrs x 6 days.

## 2016-07-29 NOTE — Discharge Instructions (Signed)
Take dissolving Zofran as needed Continue home pain medicine and follow up with Dr. Roda ShuttersXu Continue stool softener. Add Miralax if you have not had BM in the next 24 hours Return for worsening symptoms

## 2016-07-29 NOTE — ED Provider Notes (Signed)
[facility]-EMERGENCY DEPT Provider Note   CSN: [id number] Arrival date & time: 07/29/16  0258     History   Chief Complaint Chief Complaint  Patient presents with  . Emesis    HPI [name] is a 41 y.o. female who presents with emesis. PMH significant for recent bimalleolar fracture of the left lower leg after a fall a week ago s/p ORIF by Dr. Roda ShuttersXu, chronic pain with use of chronic pain medicine, chronic anemia. She had surgery on 2/28. Since then she reports doing overall well. Yesterday she started not feeling well but attributed this to the start of her period because this often makes her feel bad. She felt like "something was stuck in her throat". Last night she felt nauseous and started to have multiple episodes of emesis that contained food contents. She was prescribed Phenergan and Zofran which she has been taking without relief. She denies fever, chills, chest pain, SOB, abdominal pain, diarrhea. LBM was 2 days ago and she is passing flatus. She is taking a stool softener daily as well. She is also taking higher doses of pain medicine but chronically takes these and does not have issues with them usually. She takes 10mg  Oxycodone ER BID with an additional 5mg  Oxycodone IR prn. She reports urinary frequency but has increased her fluid intake. Start of LMP was yesterday. She received ODT Zofran in the waiting room and states this has helped.  HPI  Past Medical History:  Diagnosis Date  . ADHD   . Anemia   . Anxiety   . Arthralgia   . Bimalleolar ankle fracture 07/19/2016   left  . Bipolar disorder (HCC)   . Chronic pain    hands, knees, back  . Depression   . Migraines   . Obesity     Patient Active Problem List   Diagnosis Date Noted  . Displaced bimalleolar fracture of left lower leg, subsequent encounter for closed fracture with delayed healing   . Closed high lateral malleolus fracture, left, initial encounter   . Anemia, unspecified 02/15/2013  . Pes anserine bursitis  01/20/2011  . Edema 08/21/2010  . Nonallopathic lesion of lumbar region 07/04/2010  . Obesity, unspecified 03/27/2010  . DEPRESSION, MAJOR, MILD 03/27/2010  . Chronic pain syndrome 03/27/2010  . NONALLOPATHIC LESION OF THORACIC REGION NEC 03/27/2010  . FATIGUE 03/27/2010  . HEADACHE 03/27/2010    Past Surgical History:  Procedure Laterality Date  . CERVICAL CONIZATION W/BX  10/30/2011   Procedure: CONIZATION CERVIX WITH BIOPSY;  Surgeon: Kathreen CosierBernard A Marshall, MD;  Location: [facility];  Service: Gynecology;  Laterality: N/A;  . DILATION AND CURETTAGE OF UTERUS  10/30/2011   Procedure: DILATATION AND CURETTAGE;  Surgeon: Kathreen CosierBernard A Marshall, MD;  Location: [facility];  Service: Gynecology;  Laterality: N/A;  . DILITATION & CURRETTAGE/HYSTROSCOPY WITH HYDROTHERMAL ABLATION N/A 12/14/2013   Procedure: DILATATION & CURETTAGE/HYSTEROSCOPY WITH HYDROTHERMAL ABLATION;  Surgeon: Kathreen CosierBernard A Marshall, MD;  Location: [facility];  Service: Gynecology;  Laterality: N/A;  . ORIF ANKLE FRACTURE Left 07/23/2016   Procedure: OPEN REDUCTION INTERNAL FIXATION (ORIF) LEFT BIMALLEOLAR ANKLE FRACTURE;  Surgeon: Tarry KosNaiping M Xu, MD;  Location: [facility];  Service: Orthopedics;  Laterality: Left;  . TUBAL LIGATION  07/12/2003  . WISDOM TOOTH EXTRACTION      OB History    No data available       Home Medications    Prior to Admission medications   Medication Sig Start Date End Date Taking? Authorizing Provider  alprazolam (XANAX) 2 MG tablet Take 2 mg by mouth 2 (two) times daily as needed for anxiety.    Historical Provider, MD  amphetamine-dextroamphetamine (ADDERALL) 30 MG tablet Take 30 mg by mouth 2 (two) times daily. 01/02/16   Historical Provider, MD  aspirin EC 325 MG tablet Take 1 tablet (325 mg total) by mouth 2 (two) times daily. 07/23/16   Tarry Kos, MD  celecoxib (CELEBREX) 200 MG capsule Take 200 mg by mouth daily. 07/07/16   Historical Provider, MD  citalopram (CELEXA) 40 MG tablet Take 40 mg by  mouth daily. 12/24/15   Historical Provider, MD  Fe Cbn-Fe Gluc-FA-B12-C-DSS (FERRALET 90) 90-1 MG TABS Take 1 tablet by mouth daily. 06/08/16   Historical Provider, MD  ibuprofen (ADVIL,MOTRIN) 800 MG tablet Take 800 mg by mouth every 8 (eight) hours as needed.    Historical Provider, MD  methocarbamol (ROBAXIN) 750 MG tablet Take 1 tablet (750 mg total) by mouth 2 (two) times daily as needed for muscle spasms. 07/23/16   Tarry Kos, MD  mirtazapine (REMERON) 30 MG tablet Take 30 mg by mouth daily. 12/24/15   Historical Provider, MD  ondansetron (ZOFRAN) 4 MG tablet Take 1-2 tablets (4-8 mg total) by mouth every 8 (eight) hours as needed for nausea or vomiting. 07/23/16   Tarry Kos, MD  oxyCODONE (OXY IR/ROXICODONE) 5 MG immediate release tablet Take 1-3 tablets (5-15 mg total) by mouth every 4 (four) hours as needed. 07/23/16   Tarry Kos, MD  oxyCODONE (OXYCONTIN) 10 mg 12 hr tablet Take 1 tablet (10 mg total) by mouth every 12 (twelve) hours. 07/23/16   Tarry Kos, MD  Oxycodone HCl 10 MG TABS Take 10 mg by mouth 3 (three) times daily. 07/15/16   Historical Provider, MD  promethazine (PHENERGAN) 25 MG tablet Take 1 tablet (25 mg total) by mouth every 6 (six) hours as needed for nausea. 07/23/16   Naiping Donnelly Stager, MD  senna-docusate (SENOKOT S) 8.6-50 MG tablet Take 1 tablet by mouth at bedtime as needed. 07/23/16   Tarry Kos, MD    Family History Family History  Problem Relation Age of Onset  . Lupus Sister   . Diabetes type II    . Stroke      Social History Social History  Substance Use Topics  . Smoking status: Never Smoker  . Smokeless tobacco: Never Used  . Alcohol use No     Allergies   Buspar [buspirone]   Review of Systems Review of Systems  Constitutional: Negative for chills and fever.  Respiratory: Negative for shortness of breath.   Cardiovascular: Negative for chest pain.  Gastrointestinal: Positive for nausea and vomiting. Negative for abdominal pain,  constipation and diarrhea.  Genitourinary: Positive for frequency and vaginal bleeding. Negative for dysuria.  Musculoskeletal: Positive for arthralgias.  All other systems reviewed and are negative.    Physical Exam Updated Vital Signs BP 101/74   Pulse 82   Temp 99.1 F (37.3 C) (Oral)   Resp 16   LMP 07/29/2016 (Exact Date)   SpO2 98%   Physical Exam  Constitutional: She is oriented to person, place, and time. She appears well-developed and well-nourished. No distress.  Obese, NAD  HENT:  Head: Normocephalic and atraumatic.  Eyes: Conjunctivae are normal. Pupils are equal, round, and reactive to light. Right eye exhibits no discharge. Left eye exhibits no discharge. No scleral icterus.  Neck: Normal range of motion.  Cardiovascular: Normal rate and regular  rhythm.  Exam reveals no gallop and no friction rub.   No murmur heard. Pulmonary/Chest: Effort normal and breath sounds normal. No respiratory distress. She has no wheezes. She has no rales. She exhibits no tenderness.  Abdominal: Soft. Bowel sounds are normal. She exhibits no distension and no mass. There is no tenderness. There is no rebound and no guarding. No hernia.  Musculoskeletal:  Left lower extremity is immobilized  Neurological: She is alert and oriented to person, place, and time.  Skin: Skin is warm and dry.  Psychiatric: She has a normal mood and affect. Her behavior is normal.  Nursing note and vitals reviewed.    ED Treatments / Results  Labs (all labs ordered are listed, but only abnormal results are displayed) Labs Reviewed  COMPREHENSIVE METABOLIC PANEL - Abnormal; Notable for the following:       Result Value   Glucose, Bld 128 (*)    Albumin 3.1 (*)    AST 14 (*)    ALT 12 (*)    All other components within normal limits  CBC - Abnormal; Notable for the following:    RBC 5.58 (*)    Hemoglobin 10.5 (*)    HCT 34.9 (*)    MCV 62.5 (*)    MCH 18.8 (*)    RDW 18.2 (*)    All other  components within normal limits  LIPASE, BLOOD  URINALYSIS, ROUTINE W REFLEX MICROSCOPIC  POC URINE PREG, ED    EKG  EKG Interpretation None       Radiology Dg Abd 2 Views  Result Date: 07/29/2016 CLINICAL DATA:  Status post fixation of a left ankle fracture 07/23/2016. No bowel movement for 2 days. Vomiting yesterday. EXAM: ABDOMEN - 2 VIEW COMPARISON:  None. FINDINGS: The bowel gas pattern is nonobstructive. Moderate stool burden throughout the colon is noted. No abnormal abdominal calcification or acute bony abnormality. IMPRESSION: No acute finding. Moderate colonic stool burden. Electronically Signed   By: Drusilla Kanner M.D.   On: 07/29/2016 07:59    Procedures Procedures (including critical care time)  Medications Ordered in ED Medications  ketorolac (TORADOL) injection 30 mg (30 mg Intramuscular Given 07/29/16 0400)  ondansetron (ZOFRAN-ODT) disintegrating tablet 4 mg (4 mg Oral Given 07/29/16 0400)  sodium chloride 0.9 % bolus 1,000 mL (1,000 mLs Intravenous New Bag/Given 07/29/16 0659)  HYDROmorphone (DILAUDID) injection 1 mg (1 mg Intravenous Given 07/29/16 0659)     Initial Impression / Assessment and Plan / ED Course  I have reviewed the triage vital signs and the nursing notes.  Pertinent labs & imaging results that were available during my care of the patient were reviewed by me and considered in my medical decision making (see chart for details).  41 year old female presents with N/V for several hours. Possibly due to pain medicine but due to recent surgery will r/o obstruction/ileus. Vital signs are normal.   CBC remarkable for anemia which is at baseline. CMP remarkable for mild hyperglycemia. Abdominal xray remarkable for moderate stool burden. Advised continue stool softener and add Miralax if no BM in the next 24 hours. No evidence of obstruction. Fluids, pain medicine, Zofran given which has provided relief. PO challenge tolerated well. Will d/c with Zofran ODT.  Return precautions given.  Final Clinical Impressions(s) / ED Diagnoses   Final diagnoses:  Vomiting    New Prescriptions New Prescriptions   ONDANSETRON (ZOFRAN ODT) 4 MG DISINTEGRATING TABLET    Take 1 tablet (4 mg total) by mouth  every 8 (eight) hours as needed for nausea or vomiting.     Bethel Born, PA-C 07/29/16 1610    Dione Booze, MD 07/30/16 [phone]

## 2016-07-29 NOTE — ED Notes (Addendum)
Pt unable to void at this time. Pt given specimen cup 

## 2016-07-29 NOTE — ED Notes (Signed)
Patient tolerating PO intake

## 2016-07-30 ENCOUNTER — Emergency Department (HOSPITAL_COMMUNITY)
Admission: EM | Admit: 2016-07-30 | Discharge: 2016-07-31 | Disposition: A | Discharge status: Home / Self Care | Payer: Medicare Other | Source: Home / Self Care | Attending: Emergency Medicine | Admitting: Emergency Medicine

## 2016-07-30 ENCOUNTER — Emergency Department (HOSPITAL_COMMUNITY)
Admission: EM | Admit: 2016-07-30 | Discharge: 2016-07-30 | Disposition: A | Discharge status: Home / Self Care | Payer: Medicare Other | Attending: Dermatology | Admitting: Dermatology

## 2016-07-30 ENCOUNTER — Encounter (HOSPITAL_COMMUNITY): Payer: Self-pay | Admitting: Emergency Medicine

## 2016-07-30 ENCOUNTER — Encounter (HOSPITAL_COMMUNITY): Payer: Self-pay

## 2016-07-30 DIAGNOSIS — Z5321 Procedure and treatment not carried out due to patient leaving prior to being seen by health care provider: Secondary | ICD-10-CM | POA: Insufficient documentation

## 2016-07-30 DIAGNOSIS — R1084 Generalized abdominal pain: Secondary | ICD-10-CM

## 2016-07-30 DIAGNOSIS — R109 Unspecified abdominal pain: Secondary | ICD-10-CM | POA: Diagnosis not present

## 2016-07-30 DIAGNOSIS — R1110 Vomiting, unspecified: Secondary | ICD-10-CM | POA: Insufficient documentation

## 2016-07-30 DIAGNOSIS — Z79899 Other long term (current) drug therapy: Secondary | ICD-10-CM

## 2016-07-30 DIAGNOSIS — Z7982 Long term (current) use of aspirin: Secondary | ICD-10-CM | POA: Insufficient documentation

## 2016-07-30 DIAGNOSIS — F909 Attention-deficit hyperactivity disorder, unspecified type: Secondary | ICD-10-CM

## 2016-07-30 DIAGNOSIS — K5903 Drug induced constipation: Secondary | ICD-10-CM

## 2016-07-30 DIAGNOSIS — R102 Pelvic and perineal pain: Secondary | ICD-10-CM

## 2016-07-30 DIAGNOSIS — K5900 Constipation, unspecified: Secondary | ICD-10-CM | POA: Insufficient documentation

## 2016-07-30 MED ORDER — SODIUM CHLORIDE 0.9 % IV BOLUS (SEPSIS)
1000.0000 mL | Freq: Once | INTRAVENOUS | Status: AC
  Administered 2016-07-31: 1000 mL via INTRAVENOUS

## 2016-07-30 NOTE — ED Provider Notes (Signed)
[facility]-EMERGENCY DEPT Provider Note   CSN: [id number] Arrival date & time: 07/30/16  2128  By signing my name below, I, Clovis PuAvnee Patel, attest that this documentation has been prepared under the direction and in the presence of  BoeingChris Rainee Sweatt, PA-C. Electronically Signed: Clovis PuAvnee Patel, ED Scribe. 07/30/16. 10:58 PM.   History   Chief Complaint Chief Complaint  Patient presents with  . Constipation  . Abdominal Pain   The history is provided by the patient. No language interpreter was used.   HPI Comments:  [name] is a 41 y.o. female who presents to the Emergency Department complaining of constipation onset 5 days. She states her last normal bowel movement was 5 days ago. Pt also reports abdominal pain, vomiting x 2 days, and left ankle pain secondary to a left bimalleolar ankle fracture which she sustained 10 days ago. Pt reports she has an ORIF by Dr. Roda ShuttersXu on 07/23/2016. She has been taking 5 mg oxycodone with mild relief. Pt denies any other associated symptom. No other complaints noted.   Past Medical History:  Diagnosis Date  . ADHD   . Anemia   . Anxiety   . Arthralgia   . Bimalleolar ankle fracture 07/19/2016   left  . Bipolar disorder (HCC)   . Chronic pain    hands, knees, back  . Depression   . Migraines   . Obesity     Patient Active Problem List   Diagnosis Date Noted  . Displaced bimalleolar fracture of left lower leg, subsequent encounter for closed fracture with delayed healing   . Closed high lateral malleolus fracture, left, initial encounter   . Anemia, unspecified 02/15/2013  . Pes anserine bursitis 01/20/2011  . Edema 08/21/2010  . Nonallopathic lesion of lumbar region 07/04/2010  . Obesity, unspecified 03/27/2010  . DEPRESSION, MAJOR, MILD 03/27/2010  . Chronic pain syndrome 03/27/2010  . NONALLOPATHIC LESION OF THORACIC REGION NEC 03/27/2010  . FATIGUE 03/27/2010  . HEADACHE 03/27/2010    Past Surgical History:  Procedure Laterality Date  .  CERVICAL CONIZATION W/BX  10/30/2011   Procedure: CONIZATION CERVIX WITH BIOPSY;  Surgeon: Kathreen CosierBernard A Marshall, MD;  Location: [facility];  Service: Gynecology;  Laterality: N/A;  . DILATION AND CURETTAGE OF UTERUS  10/30/2011   Procedure: DILATATION AND CURETTAGE;  Surgeon: Kathreen CosierBernard A Marshall, MD;  Location: [facility];  Service: Gynecology;  Laterality: N/A;  . DILITATION & CURRETTAGE/HYSTROSCOPY WITH HYDROTHERMAL ABLATION N/A 12/14/2013   Procedure: DILATATION & CURETTAGE/HYSTEROSCOPY WITH HYDROTHERMAL ABLATION;  Surgeon: Kathreen CosierBernard A Marshall, MD;  Location: [facility];  Service: Gynecology;  Laterality: N/A;  . ORIF ANKLE FRACTURE Left 07/23/2016   Procedure: OPEN REDUCTION INTERNAL FIXATION (ORIF) LEFT BIMALLEOLAR ANKLE FRACTURE;  Surgeon: Tarry KosNaiping M Xu, MD;  Location: [facility];  Service: Orthopedics;  Laterality: Left;  . TUBAL LIGATION  07/12/2003  . WISDOM TOOTH EXTRACTION      OB History    No data available       Home Medications    Prior to Admission medications   Medication Sig Start Date End Date Taking? Authorizing Provider  alprazolam Prudy Feeler(XANAX) 2 MG tablet Take 2 mg by mouth 2 (two) times daily as needed for anxiety.   Yes Historical Provider, MD  amphetamine-dextroamphetamine (ADDERALL) 30 MG tablet Take 30 mg by mouth 2 (two) times daily. 01/02/16  Yes Historical Provider, MD  aspirin EC 325 MG tablet Take 1 tablet (325 mg total) by mouth 2 (two) times daily. 07/23/16  Yes Naiping  Donnelly Stager, MD  celecoxib (CELEBREX) 200 MG capsule Take 200 mg by mouth daily. 07/07/16  Yes Historical Provider, MD  citalopram (CELEXA) 40 MG tablet Take 40 mg by mouth daily. 12/24/15  Yes Historical Provider, MD  Fe Cbn-Fe Gluc-FA-B12-C-DSS (FERRALET 90) 90-1 MG TABS Take 1 tablet by mouth daily. 06/08/16  Yes Historical Provider, MD  ibuprofen (ADVIL,MOTRIN) 800 MG tablet Take 800 mg by mouth every 8 (eight) hours as needed for moderate pain.    Yes Historical Provider, MD  methocarbamol (ROBAXIN) 750 MG  tablet Take 1 tablet (750 mg total) by mouth 2 (two) times daily as needed for muscle spasms. 07/23/16  Yes Tarry Kos, MD  mirtazapine (REMERON) 30 MG tablet Take 30 mg by mouth daily. 12/24/15  Yes Historical Provider, MD  ondansetron (ZOFRAN ODT) 4 MG disintegrating tablet Take 1 tablet (4 mg total) by mouth every 8 (eight) hours as needed for nausea or vomiting. 07/29/16  Yes Bethel Born, PA-C  ondansetron (ZOFRAN) 4 MG tablet Take 1-2 tablets (4-8 mg total) by mouth every 8 (eight) hours as needed for nausea or vomiting. 07/23/16  Yes Naiping Donnelly Stager, MD  oxyCODONE (OXY IR/ROXICODONE) 5 MG immediate release tablet Take 1-3 tablets (5-15 mg total) by mouth every 4 (four) hours as needed. 07/23/16  Yes Tarry Kos, MD  oxyCODONE (OXYCONTIN) 10 mg 12 hr tablet Take 1 tablet (10 mg total) by mouth every 12 (twelve) hours. 07/23/16  Yes Naiping Donnelly Stager, MD  promethazine (PHENERGAN) 25 MG tablet Take 1 tablet (25 mg total) by mouth every 6 (six) hours as needed for nausea. 07/23/16  Yes Naiping Donnelly Stager, MD  senna-docusate (SENOKOT S) 8.6-50 MG tablet Take 1 tablet by mouth at bedtime as needed. Patient taking differently: Take 1 tablet by mouth at bedtime as needed for mild constipation.  07/23/16  Yes Naiping Donnelly Stager, MD    Family History Family History  Problem Relation Age of Onset  . Lupus Sister   . Diabetes type II    . Stroke      Social History Social History  Substance Use Topics  . Smoking status: Never Smoker  . Smokeless tobacco: Never Used  . Alcohol use No     Allergies   Buspar [buspirone]   Review of Systems Review of Systems  Gastrointestinal: Positive for abdominal pain, constipation and vomiting.  Musculoskeletal: Positive for arthralgias and myalgias.  All other systems reviewed and are negative.  Physical Exam Updated Vital Signs BP 107/76 (BP Location: Right Arm)   Pulse 86   Temp 97.8 F (36.6 C) (Oral)   Resp 18   Ht 5\' 6"  (1.676 m)   Wt 295 lb (133.8 kg)    LMP 07/29/2016 (Exact Date)   SpO2 100%   BMI 47.61 kg/m   Physical Exam  Constitutional: She is oriented to person, place, and time. She appears well-developed and well-nourished. No distress.  HENT:  Head: Normocephalic and atraumatic.  Mouth/Throat: Oropharynx is clear and moist.  Eyes: Pupils are equal, round, and reactive to light.  Neck: Normal range of motion. Neck supple.  Cardiovascular: Normal rate, regular rhythm and normal heart sounds.  Exam reveals no gallop and no friction rub.   No murmur heard. Pulmonary/Chest: Effort normal and breath sounds normal. No respiratory distress. She has no wheezes.  Abdominal: Soft. Bowel sounds are normal. She exhibits no distension. There is no tenderness. There is no rigidity, no rebound and no guarding.  Diffuse abdominal  pain. No guarding, rebound or rigidity.   Neurological: She is alert and oriented to person, place, and time. She exhibits normal muscle tone. Coordination normal.  Skin: Skin is warm and dry. Capillary refill takes less than 2 seconds. No rash noted. No erythema.  Psychiatric: She has a normal mood and affect. Her behavior is normal.  Nursing note and vitals reviewed.    ED Treatments / Results  DIAGNOSTIC STUDIES:  Oxygen Saturation is 100% on RA, normal by my interpretation.    COORDINATION OF CARE:  10:29 PM Discussed treatment plan with pt at bedside and pt agreed to plan.  Labs (all labs ordered are listed, but only abnormal results are displayed) Labs Reviewed - No data to display  EKG  EKG Interpretation None       Radiology Dg Abd 2 Views  Result Date: 07/29/2016 CLINICAL DATA:  Status post fixation of a left ankle fracture 07/23/2016. No bowel movement for 2 days. Vomiting yesterday. EXAM: ABDOMEN - 2 VIEW COMPARISON:  None. FINDINGS: The bowel gas pattern is nonobstructive. Moderate stool burden throughout the colon is noted. No abnormal abdominal calcification or acute bony abnormality.  IMPRESSION: No acute finding. Moderate colonic stool burden. Electronically Signed   By: Drusilla Kanner M.D.   On: 07/29/2016 07:59    Procedures Procedures (including critical care time)  Medications Ordered in ED Medications - No data to display   Initial Impression / Assessment and Plan / ED Course  I have reviewed the triage vital signs and the nursing notes.  Pertinent labs & imaging results that were available during my care of the patient were reviewed by me and considered in my medical decision making (see chart for details).     Patient was given colace, milk and molasses enema, and bisacodyl suppository. The patient had several bowel movements here in the ER. I gave the plan to the patient and she was stating that she was having pain and wants me to call someone to evacuate the stool from her colon. I explained that constipation is not a process that has a rapid resolution at times. She is on pain medications and had recent anesthesia. The patient is advised follow up with her PCP. Told to return here as needed.  Final Clinical Impressions(s) / ED Diagnoses   Final diagnoses:  None    New Prescriptions New Prescriptions   No medications on file  I personally performed the services described in this documentation, which was scribed in my presence. The recorded information has been reviewed and is accurate.     Charlestine Night, PA-C 08/08/16 9147    April Palumbo, MD 08/08/16 [phone]

## 2016-07-30 NOTE — ED Notes (Signed)
Pt called 3x to be roomed with no response RN notified.

## 2016-07-30 NOTE — ED Notes (Signed)
Pt's husband has repeatedly been belligerent with staff, cursing, yelling, and insisting that the patient's constipation is worse than other people's emergencies.  Pt stated that they came EMS so that they'd be seen faster.

## 2016-07-30 NOTE — ED Triage Notes (Signed)
Per EMS, pt fell x 1 week ago and fractured ankle.  Hospital last night for emesis.  Constipation x 4 days.  Abdominal pain with emesis.  Vitals: 80, 18, 130/82, 98%ra

## 2016-07-30 NOTE — ED Notes (Signed)
In Ct  

## 2016-07-30 NOTE — ED Triage Notes (Signed)
Patient c/o abdominal pain in all 4 quadrants, reports constipation x5 days.  Patient brought in by EMS earlier today and sent to triage/waitnig room. Patient reported leaving before being scene. Pt went to [facility] and was told they could do nothing for her. Patient called EMS again and brought to this location. Patient reports taking miralax today and some other medicine in a green bottle. Patient rates pain 10/10. Pt also had leg sx last Wednesday and reports swelling, thought to be from sitting on the toilet too long today.
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
## 2016-07-31 ENCOUNTER — Encounter (HOSPITAL_COMMUNITY): Payer: Self-pay

## 2016-07-31 ENCOUNTER — Emergency Department (HOSPITAL_COMMUNITY): Payer: Medicare Other

## 2016-07-31 DIAGNOSIS — K5900 Constipation, unspecified: Secondary | ICD-10-CM | POA: Diagnosis not present

## 2016-07-31 LAB — CBC WITH DIFFERENTIAL/PLATELET
Basophils Absolute: 0 10*3/uL (ref 0.0–0.1)
Basophils Relative: 0 %
Eosinophils Absolute: 0 10*3/uL (ref 0.0–0.7)
Eosinophils Relative: 0 %
HCT: 34.5 % — ABNORMAL LOW (ref 36.0–46.0)
Hemoglobin: 10.8 g/dL — ABNORMAL LOW (ref 12.0–15.0)
Lymphocytes Relative: 18 %
Lymphs Abs: 2.2 10*3/uL (ref 0.7–4.0)
MCH: 19.1 pg — ABNORMAL LOW (ref 26.0–34.0)
MCHC: 31.3 g/dL (ref 30.0–36.0)
MCV: 61 fL — ABNORMAL LOW (ref 78.0–100.0)
Monocytes Absolute: 0.7 10*3/uL (ref 0.1–1.0)
Monocytes Relative: 6 %
Neutro Abs: 9.2 10*3/uL — ABNORMAL HIGH (ref 1.7–7.7)
Neutrophils Relative %: 76 %
Platelets: 296 10*3/uL (ref 150–400)
RBC: 5.66 MIL/uL — ABNORMAL HIGH (ref 3.87–5.11)
RDW: 18.3 % — ABNORMAL HIGH (ref 11.5–15.5)
WBC: 12.1 10*3/uL — ABNORMAL HIGH (ref 4.0–10.5)

## 2016-07-31 LAB — COMPREHENSIVE METABOLIC PANEL
ALT: 13 U/L — ABNORMAL LOW (ref 14–54)
AST: 17 U/L (ref 15–41)
Albumin: 3.6 g/dL (ref 3.5–5.0)
Alkaline Phosphatase: 67 U/L (ref 38–126)
Anion gap: 6 (ref 5–15)
BUN: 10 mg/dL (ref 6–20)
CO2: 26 mmol/L (ref 22–32)
Calcium: 9.4 mg/dL (ref 8.9–10.3)
Chloride: 105 mmol/L (ref 101–111)
Creatinine, Ser: 0.7 mg/dL (ref 0.44–1.00)
GFR calc Af Amer: >60 mL/min (ref >60)
GFR calc non Af Amer: >60 mL/min (ref >60)
Glucose, Bld: 120 mg/dL — ABNORMAL HIGH (ref 65–99)
Potassium: 4 mmol/L (ref 3.5–5.1)
Sodium: 137 mmol/L (ref 135–145)
Total Bilirubin: 0.7 mg/dL (ref 0.3–1.2)
Total Protein: 7.9 g/dL (ref 6.5–8.1)

## 2016-07-31 LAB — URINALYSIS, ROUTINE W REFLEX MICROSCOPIC
Bilirubin Urine: NEGATIVE
Glucose, UA: NEGATIVE mg/dL
Ketones, ur: NEGATIVE mg/dL
Leukocytes, UA: NEGATIVE
Nitrite: NEGATIVE
Protein, ur: NEGATIVE mg/dL
Specific Gravity, Urine: 1.005 (ref 1.005–1.030)
pH: 6 (ref 5.0–8.0)

## 2016-07-31 LAB — LIPASE, BLOOD: Lipase: 19 U/L (ref 11–51)

## 2016-07-31 LAB — PREGNANCY, URINE: Preg Test, Ur: NEGATIVE

## 2016-07-31 MED ORDER — DOCUSATE SODIUM 100 MG PO CAPS
100.0000 mg | ORAL_CAPSULE | Freq: Once | ORAL | Status: AC
  Administered 2016-07-31: 100 mg via ORAL
  Filled 2016-07-31: qty 1

## 2016-07-31 MED ORDER — MAGNESIUM CITRATE PO SOLN: 1.0000 | Freq: Once | ORAL | 0 refills | Status: AC

## 2016-07-31 MED ORDER — HYDROMORPHONE HCL 1 MG/ML IJ SOLN
1.0000 mg | Freq: Once | INTRAMUSCULAR | Status: AC
  Administered 2016-07-31: 1 mg via INTRAVENOUS
  Filled 2016-07-31: qty 1

## 2016-07-31 MED ORDER — IOPAMIDOL (ISOVUE-300) INJECTION 61%
INTRAVENOUS | Status: AC
  Administered 2016-07-31: 100 mL via INTRAVENOUS
  Filled 2016-07-31: qty 100

## 2016-07-31 MED ORDER — IOPAMIDOL (ISOVUE-300) INJECTION 61%
100.0000 mL | Freq: Once | INTRAVENOUS | Status: AC | PRN
  Administered 2016-07-31: 100 mL via INTRAVENOUS

## 2016-07-31 MED ORDER — MILK AND MOLASSES ENEMA
1.0000 | Freq: Once | RECTAL | Status: AC
  Administered 2016-07-31: 250 mL via RECTAL
  Filled 2016-07-31: qty 250

## 2016-07-31 MED ORDER — BISACODYL 10 MG RE SUPP: 10.0000 mg | RECTAL | 0 refills | Status: DC | PRN

## 2016-07-31 MED ORDER — BISACODYL 10 MG RE SUPP
10.0000 mg | Freq: Once | RECTAL | Status: AC
  Administered 2016-07-31: 10 mg via RECTAL
  Filled 2016-07-31: qty 1

## 2016-07-31 MED ORDER — DOCUSATE SODIUM 100 MG PO CAPS: 100.0000 mg | ORAL_CAPSULE | Freq: Two times a day (BID) | ORAL | 0 refills | Status: DC

## 2016-07-31 NOTE — ED Notes (Signed)
IN Ct

## 2016-07-31 NOTE — ED Notes (Addendum)
Pt has been cleaned up and changed. Pt stated that staff has not done anything to help her and that pt's husband has done all the work.

## 2016-07-31 NOTE — ED Notes (Signed)
Pt is now requesting ambulance transport home, since she can't walk and can't sit up due to pain. [facility] paged.

## 2016-07-31 NOTE — ED Notes (Signed)
Pt given enema.  Watery brown stool resulted from being placed on bedside commode.  Pt reports cramping pain and was already defecating from suppository when enema was being given.  MD aware.

## 2016-07-31 NOTE — ED Notes (Signed)
[facility] arrived and are calling their supervisor to get approval to transport the pt.

## 2016-07-31 NOTE — Discharge Instructions (Signed)
Return here as needed. Follow up with your doctor for a recheck. Increase your fluid intake. Drinking warm prune juice has also been shown very effective for constipation.

## 2016-07-31 NOTE — ED Notes (Signed)
Pt refusing discharge, saying that she wants to stay until her 'stomach settles' and that she does't feel comfortable going home when she still feels the need to go to the restroom.  Pt & family continually asking for a solution to constipation issue then moving on to ask how to stop the defecation now that pt is able to go again.  Husband states "she can't go home like that, she has to be admitted!"  Pt is being cleaned regularly and continues to have watery diarrhea as expected from her suppository and enema.  Pt & family are uncooperative and ask for pain medication repeatedly despite the MD and RN explaining that medication would only constipate her further (as it is one of the causes of her visit today).  MD aware of all this.  Pt given warm prune juice by RN.

## 2016-07-31 NOTE — ED Notes (Signed)
Back from Ct.

## 2016-08-01 NOTE — ED Provider Notes (Signed)
Medical screening examination/treatment/procedure(s) were performed by non-physician practitioner and as supervising physician I was immediately available for consultation/collaboration.   EKG Interpretation None        Maripaz Mullan, MD 08/01/16 [phone]

## 2016-08-06 ENCOUNTER — Telehealth (INDEPENDENT_AMBULATORY_CARE_PROVIDER_SITE_OTHER): Payer: Self-pay | Admitting: Orthopaedic Surgery

## 2016-08-06 MED ORDER — PROMETHAZINE HCL 25 MG PO TABS: 25.0000 mg | ORAL_TABLET | Freq: Four times a day (QID) | ORAL | 1 refills | Status: DC | PRN

## 2016-08-06 NOTE — Telephone Encounter (Signed)
I sent in phenergan 

## 2016-08-06 NOTE — Telephone Encounter (Signed)
Please advise 

## 2016-08-06 NOTE — Telephone Encounter (Signed)
Pt requesting more nausea medication.  [phone]

## 2016-08-07 ENCOUNTER — Ambulatory Visit (INDEPENDENT_AMBULATORY_CARE_PROVIDER_SITE_OTHER): Payer: Medicare Other | Admitting: Orthopaedic Surgery

## 2016-08-07 ENCOUNTER — Encounter (INDEPENDENT_AMBULATORY_CARE_PROVIDER_SITE_OTHER): Payer: Self-pay | Admitting: Orthopaedic Surgery

## 2016-08-07 ENCOUNTER — Ambulatory Visit (INDEPENDENT_AMBULATORY_CARE_PROVIDER_SITE_OTHER): Payer: Medicare Other

## 2016-08-07 DIAGNOSIS — S82842G Displaced bimalleolar fracture of left lower leg, subsequent encounter for closed fracture with delayed healing: Secondary | ICD-10-CM

## 2016-08-07 MED ORDER — OXYCODONE HCL 5 MG PO TABS: 5.0000 mg | ORAL_TABLET | Freq: Three times a day (TID) | ORAL | 0 refills | Status: DC | PRN

## 2016-08-07 MED ORDER — ONDANSETRON HCL 4 MG PO TABS: ORAL_TABLET | ORAL | 0 refills | Status: DC

## 2016-08-07 MED ORDER — ONDANSETRON HCL 4 MG PO TABS: 4.0000 mg | ORAL_TABLET | Freq: Three times a day (TID) | ORAL | 4 refills | Status: DC | PRN

## 2016-08-07 NOTE — Progress Notes (Signed)
Enrique SackKendra is 2 weeks status post ORIF left ankle fracture with syndesmosis injury. She is doing well. She did have to go back to the ER for constipation. She is now having bowel movements. The surgical incisions well-healed without any signs of infection. The x-rays are stable. Sutures were removed today. Continue nonweightbearing. Follow-up in 4 weeks with repeat 2 view x-rays of the left ankle. Cam Walker given. Zofran of oxycodone refilled today.

## 2016-08-11 ENCOUNTER — Telehealth (INDEPENDENT_AMBULATORY_CARE_PROVIDER_SITE_OTHER): Payer: Self-pay | Admitting: Orthopaedic Surgery

## 2016-08-11 NOTE — Telephone Encounter (Signed)
Pt husband requesting an extra large wheelchair for pt for a leg lift. ° °[phone] °

## 2016-08-11 NOTE — Telephone Encounter (Signed)
yes

## 2016-08-11 NOTE — Telephone Encounter (Signed)
Patient's husband called also needing a pad for the wheelchair. He asked if he can get the wheel chair, leg lift and pad as soon as possible. Patient will need in (extra large) per Kelvin. The number to contact him is [phone]

## 2016-08-11 NOTE — Telephone Encounter (Signed)
Pt husband requesting an extra large wheelchair for pt for a leg lift.  [phone]

## 2016-08-11 NOTE — Telephone Encounter (Signed)
Please advise 

## 2016-08-11 NOTE — Telephone Encounter (Signed)
rx done ready for pick up

## 2016-08-14 ENCOUNTER — Telehealth (INDEPENDENT_AMBULATORY_CARE_PROVIDER_SITE_OTHER): Payer: Self-pay | Admitting: *Deleted

## 2016-08-14 ENCOUNTER — Other Ambulatory Visit (INDEPENDENT_AMBULATORY_CARE_PROVIDER_SITE_OTHER): Payer: Self-pay | Admitting: Orthopaedic Surgery

## 2016-08-14 NOTE — Telephone Encounter (Signed)
Please advise 

## 2016-08-14 NOTE — Telephone Encounter (Signed)
Husband called and he would like to know if we received a fax I said no I have not received anything and I advised for him to call and ask them to refax to [phone]. I advised if they need OV notes I can fax to them I just need a fax number. Husband said he would call them and will call us back.

## 2016-08-14 NOTE — Telephone Encounter (Signed)
Patient's husband called in this afternoon in regards to [facility] needing some more information filled out in order for her to receive a new wheelchair. Alicia's CB # [phone]. Thank you

## 2016-09-04 ENCOUNTER — Ambulatory Visit (INDEPENDENT_AMBULATORY_CARE_PROVIDER_SITE_OTHER): Payer: Medicare Other

## 2016-09-04 ENCOUNTER — Ambulatory Visit (INDEPENDENT_AMBULATORY_CARE_PROVIDER_SITE_OTHER): Payer: Medicare Other | Admitting: Orthopaedic Surgery

## 2016-09-04 ENCOUNTER — Encounter (INDEPENDENT_AMBULATORY_CARE_PROVIDER_SITE_OTHER): Payer: Self-pay | Admitting: Orthopaedic Surgery

## 2016-09-04 DIAGNOSIS — S82842G Displaced bimalleolar fracture of left lower leg, subsequent encounter for closed fracture with delayed healing: Secondary | ICD-10-CM | POA: Diagnosis not present

## 2016-09-04 NOTE — Progress Notes (Signed)
[name] is 6 weeks status post ORIF left ankle with syndesmosis injury. She has been ambulating with a wheelchair. She is a very large lady and has significant anxiety about weightbearing. On exam her ankle incision has healed.  She does have mild swelling. There is no signs of infection. X-ray show stable fixation without competitions. At this point she may begin physical therapy for 25% partial weightbearing for the next 4 weeks. After that time we'll bring her back for repeat 3 view x-rays of the left ankle. Anticipate allowing her to fully weight-bear at that time. We also discussed removal of the 2 syndesmotic screws at 4 months postoperatively.

## 2016-09-18 ENCOUNTER — Telehealth (INDEPENDENT_AMBULATORY_CARE_PROVIDER_SITE_OTHER): Payer: Self-pay | Admitting: Orthopaedic Surgery

## 2016-09-18 NOTE — Telephone Encounter (Signed)
Please advise if so, where and what would you like the order to say?

## 2016-09-18 NOTE — Telephone Encounter (Signed)
HHPT eval and treat s/p ORIF ankle.  25% PWB x 4 weeks then WBAT after that.

## 2016-09-18 NOTE — Telephone Encounter (Signed)
Patient called asked if she can get (PT) to come to her home. Patient said she does not have transportation to get to the therapy. The number to contact patient is [phone]

## 2016-09-22 NOTE — Telephone Encounter (Signed)
[facility] to [facility] 

## 2016-10-06 ENCOUNTER — Encounter (INDEPENDENT_AMBULATORY_CARE_PROVIDER_SITE_OTHER): Payer: Self-pay

## 2016-10-06 ENCOUNTER — Ambulatory Visit (INDEPENDENT_AMBULATORY_CARE_PROVIDER_SITE_OTHER): Payer: Medicare Other | Admitting: Orthopaedic Surgery

## 2016-10-06 ENCOUNTER — Ambulatory Visit (INDEPENDENT_AMBULATORY_CARE_PROVIDER_SITE_OTHER): Payer: Medicare Other

## 2016-10-06 DIAGNOSIS — S82842G Displaced bimalleolar fracture of left lower leg, subsequent encounter for closed fracture with delayed healing: Secondary | ICD-10-CM | POA: Diagnosis not present

## 2016-10-06 NOTE — Progress Notes (Signed)
[name] is 3 months status post ORIF of a ankle fracture with syndesmotic injury. She is overall doing okay. She does have severe anxiety for which she prefers home health physical therapy. She is doing better overall. Her scar is fully healed. No signs of infection. Her x-ray show stable fixation and alignment of the fracture. At this point I'll like her to advance to weightbearing as tolerated in a cam walker. Home health physical therapy was ordered. Follow-up in a month for discussion of removal of the syndesmotic screws. 3 view x-rays of the left ankle on return.

## 2016-10-09 ENCOUNTER — Telehealth (INDEPENDENT_AMBULATORY_CARE_PROVIDER_SITE_OTHER): Payer: Self-pay

## 2016-10-09 ENCOUNTER — Telehealth (INDEPENDENT_AMBULATORY_CARE_PROVIDER_SITE_OTHER): Payer: Self-pay | Admitting: Orthopaedic Surgery

## 2016-10-09 NOTE — Telephone Encounter (Signed)
Tried to  Call no answer will try again later

## 2016-10-09 NOTE — Telephone Encounter (Signed)
Patient returned call and I advised her of message concerning Dorene SorrowJerry trying to get in contact with her.  Gave patient phone number to return his call.

## 2016-10-09 NOTE — Telephone Encounter (Signed)
[facility] saying that they have been trying to get in touch with the patient but she has a voicemail that isn't set up yet and they cannot locate her. CB # [phone]

## 2016-10-13 ENCOUNTER — Telehealth (INDEPENDENT_AMBULATORY_CARE_PROVIDER_SITE_OTHER): Payer: Self-pay | Admitting: Orthopaedic Surgery

## 2016-10-13 NOTE — Telephone Encounter (Signed)
yes

## 2016-10-13 NOTE — Telephone Encounter (Signed)
See message below °

## 2016-10-13 NOTE — Telephone Encounter (Signed)
[name] (PT) with [facility] called needing verbal orders for HH (PT) 3 wk 4. The number to contact [name] is [phone]

## 2016-10-14 NOTE — Telephone Encounter (Signed)
Called Flore to advise

## 2016-11-04 ENCOUNTER — Ambulatory Visit (INDEPENDENT_AMBULATORY_CARE_PROVIDER_SITE_OTHER): Payer: Medicare HMO | Admitting: Orthopaedic Surgery

## 2016-11-04 ENCOUNTER — Ambulatory Visit (INDEPENDENT_AMBULATORY_CARE_PROVIDER_SITE_OTHER): Payer: Medicare HMO

## 2016-11-04 DIAGNOSIS — S8262XA Displaced fracture of lateral malleolus of left fibula, initial encounter for closed fracture: Secondary | ICD-10-CM

## 2016-11-04 NOTE — Progress Notes (Signed)
Office Visit Note   Patient: [name]           Date of Birth: 10-31-1975           MRN: [id number] Visit Date: 11/04/2016              Requested by: Fleet ContrasAvbuere, Edwin, MD [address] [place], [place] [id number] PCP: Fleet ContrasAvbuere, Edwin, MD   Assessment & Plan: Visit Diagnoses:  1. Closed high lateral malleolus fracture, left, initial encounter     Plan: At this point we will get her scheduled in July for removal of the syndesmotic screws. I did give her a prescription for shower chair and a wider Rollator since she got large hips and she is not able to fit into the regular Rollator.  Follow-Up Instructions: Return if symptoms worsen or fail to improve.   Orders:  Orders Placed This Encounter  Procedures  . XR Ankle Complete Left   No orders of the defined types were placed in this encounter.     Procedures: No procedures performed   Clinical Data: No additional findings.   Subjective: Chief Complaint  Patient presents with  . Left Ankle - Pain, Follow-up    [name] is almost 4 months status post ORIF left ankle and syndesmosis. She is ambulating with a Rollator and a Cam Walker. Overall she is doing better. She is working really hard with physical therapy. Her pain is well-controlled.    Review of Systems   Objective: Vital Signs: There were no vitals taken for this visit.  Physical Exam  Ortho Exam Left ankle exam shows a fully healed scar. There is no significant swelling. Specialty Comments:  No specialty comments available.  Imaging: Xr Ankle Complete Left  Result Date: 11/04/2016 Disuse osteopenia. Stable fixation and syndesmosis    PMFS History: Patient Active Problem List   Diagnosis Date Noted  . Displaced bimalleolar fracture of left lower leg, subsequent encounter for closed fracture with delayed healing   . Closed high lateral malleolus fracture, left, initial encounter   . Anemia, unspecified 02/15/2013  . Pes anserine bursitis  01/20/2011  . Edema 08/21/2010  . Nonallopathic lesion of lumbar region 07/04/2010  . Obesity, unspecified 03/27/2010  . DEPRESSION, MAJOR, MILD 03/27/2010  . Chronic pain syndrome 03/27/2010  . NONALLOPATHIC LESION OF THORACIC REGION NEC 03/27/2010  . FATIGUE 03/27/2010  . HEADACHE 03/27/2010   Past Medical History:  Diagnosis Date  . ADHD   . Anemia   . Anxiety   . Arthralgia   . Bimalleolar ankle fracture 07/19/2016   left  . Bipolar disorder (HCC)   . Chronic pain    hands, knees, back  . Depression   . Migraines   . Obesity     Family History  Problem Relation Age of Onset  . Lupus Sister   . Diabetes type II Unknown   . Stroke Unknown     Past Surgical History:  Procedure Laterality Date  . CERVICAL CONIZATION W/BX  10/30/2011   Procedure: CONIZATION CERVIX WITH BIOPSY;  Surgeon: Kathreen CosierBernard A Marshall, MD;  Location: [facility];  Service: Gynecology;  Laterality: N/A;  . DILATION AND CURETTAGE OF UTERUS  10/30/2011   Procedure: DILATATION AND CURETTAGE;  Surgeon: Kathreen CosierBernard A Marshall, MD;  Location: [facility];  Service: Gynecology;  Laterality: N/A;  . DILITATION & CURRETTAGE/HYSTROSCOPY WITH HYDROTHERMAL ABLATION N/A 12/14/2013   Procedure: DILATATION & CURETTAGE/HYSTEROSCOPY WITH HYDROTHERMAL ABLATION;  Surgeon: Kathreen CosierBernard A Marshall, MD;  Location: [facility];  Service:  Gynecology;  Laterality: N/A;  . ORIF ANKLE FRACTURE Left 07/23/2016   Procedure: OPEN REDUCTION INTERNAL FIXATION (ORIF) LEFT BIMALLEOLAR ANKLE FRACTURE;  Surgeon: Tarry Kos, MD;  Location: [facility];  Service: Orthopedics;  Laterality: Left;  . TUBAL LIGATION  07/12/2003  . WISDOM TOOTH EXTRACTION     Social History   Occupational History  . Not on file.   Social History Main Topics  . Smoking status: Never Smoker  . Smokeless tobacco: Never Used  . Alcohol use No  . Drug use: No  . Sexual activity: Yes    Partners: Male    Birth control/ protection: Surgical

## 2016-11-07 ENCOUNTER — Telehealth (INDEPENDENT_AMBULATORY_CARE_PROVIDER_SITE_OTHER): Payer: Self-pay | Admitting: Orthopaedic Surgery

## 2016-11-07 NOTE — Telephone Encounter (Signed)
Please advise 

## 2016-11-07 NOTE — Telephone Encounter (Signed)
[name] advised patient had surgery on her Rt Ankle in February and is still taking 325 mg of aspirin 2 times a day. She asked if Dr Roda ShuttersXu want patient to continue taking that dosage. The number to contact [name] is [phone]

## 2016-11-07 NOTE — Telephone Encounter (Signed)
Please advise Probably not, right?

## 2016-11-07 NOTE — Telephone Encounter (Signed)
Send to [employer]

## 2016-11-09 NOTE — Telephone Encounter (Signed)
That's right.  Should stop now

## 2016-11-10 NOTE — Telephone Encounter (Signed)
LMOM for [name] giving her that info from [employer]

## 2016-11-24 ENCOUNTER — Telehealth (INDEPENDENT_AMBULATORY_CARE_PROVIDER_SITE_OTHER): Payer: Self-pay | Admitting: *Deleted

## 2016-11-24 ENCOUNTER — Telehealth (INDEPENDENT_AMBULATORY_CARE_PROVIDER_SITE_OTHER): Payer: Self-pay

## 2016-11-24 NOTE — Telephone Encounter (Signed)
Pt calling to schedule surgery. She stated she has not gotten a call back and is upset.

## 2016-11-24 NOTE — Telephone Encounter (Signed)
Spoke with patient and scheduled surgery. °

## 2016-11-24 NOTE — Telephone Encounter (Signed)
Megan JohnsFYI- Elizabeth [facility] stated that patient is having significant increased left ankle pain.  Stated that patient's left ankle is swollen, but not warm.  No falls, patient is doing better, but today is not a good day.  Cb# is [phone].

## 2016-11-25 NOTE — Telephone Encounter (Signed)
It's normal to have periodic flare ups

## 2016-11-27 NOTE — Telephone Encounter (Signed)
I called and advised [name] on message

## 2016-11-28 ENCOUNTER — Telehealth (INDEPENDENT_AMBULATORY_CARE_PROVIDER_SITE_OTHER): Payer: Self-pay | Admitting: Orthopaedic Surgery

## 2016-11-28 NOTE — Telephone Encounter (Signed)
Ranae PalmsElizabeth Hildreth (PT) with [facility] called left voicemail message advised patient canceled last visit for her ankle fx. Patient will start therapy at some other setting. The number to contact Lanora Manislizabeth is [phone]

## 2016-12-01 ENCOUNTER — Telehealth (INDEPENDENT_AMBULATORY_CARE_PROVIDER_SITE_OTHER): Payer: Self-pay

## 2016-12-01 NOTE — Telephone Encounter (Signed)
See message below °

## 2016-12-01 NOTE — Telephone Encounter (Signed)
Is this okay if so what would you like order to say.

## 2016-12-01 NOTE — Telephone Encounter (Signed)
Patient would like a Rx for PT for [facility].  CB# is [phone].  Please advise. Thank You.

## 2016-12-01 NOTE — Telephone Encounter (Signed)
PT eval and treat s/p ORIF ankle

## 2016-12-02 NOTE — Telephone Encounter (Signed)
[facility]axed to [facility])

## 2016-12-10 ENCOUNTER — Telehealth (INDEPENDENT_AMBULATORY_CARE_PROVIDER_SITE_OTHER): Payer: Self-pay | Admitting: Orthopaedic Surgery

## 2016-12-10 NOTE — Telephone Encounter (Signed)
FYI

## 2016-12-10 NOTE — Telephone Encounter (Signed)
[facility] CALLED TO ADVISE THEIR PT WILL BE SEEING PT TOMORROW TO START HH SERVICES.  [phone]

## 2016-12-11 ENCOUNTER — Telehealth (INDEPENDENT_AMBULATORY_CARE_PROVIDER_SITE_OTHER): Payer: Self-pay | Admitting: Orthopaedic Surgery

## 2016-12-11 NOTE — Telephone Encounter (Signed)
See message below °

## 2016-12-11 NOTE — Telephone Encounter (Signed)
[facility] PT REQUESTS VERBAL FOR 3X FOR 3 WK THEN 2X WK FOR 2 WKS.  [phone]

## 2016-12-12 ENCOUNTER — Emergency Department (HOSPITAL_COMMUNITY)
Admission: EM | Admit: 2016-12-12 | Discharge: 2016-12-13 | Disposition: A | Discharge status: Home / Self Care | Payer: Medicare Other | Attending: Emergency Medicine | Admitting: Emergency Medicine

## 2016-12-12 ENCOUNTER — Encounter (HOSPITAL_COMMUNITY): Payer: Self-pay | Admitting: Emergency Medicine

## 2016-12-12 DIAGNOSIS — Z79899 Other long term (current) drug therapy: Secondary | ICD-10-CM | POA: Diagnosis not present

## 2016-12-12 DIAGNOSIS — Z791 Long term (current) use of non-steroidal anti-inflammatories (NSAID): Secondary | ICD-10-CM | POA: Diagnosis not present

## 2016-12-12 DIAGNOSIS — G43809 Other migraine, not intractable, without status migrainosus: Secondary | ICD-10-CM | POA: Insufficient documentation

## 2016-12-12 DIAGNOSIS — S8262XD Displaced fracture of lateral malleolus of left fibula, subsequent encounter for closed fracture with routine healing: Secondary | ICD-10-CM | POA: Diagnosis not present

## 2016-12-12 DIAGNOSIS — R51 Headache: Secondary | ICD-10-CM | POA: Diagnosis present

## 2016-12-12 DIAGNOSIS — G8929 Other chronic pain: Secondary | ICD-10-CM | POA: Insufficient documentation

## 2016-12-12 DIAGNOSIS — D649 Anemia, unspecified: Secondary | ICD-10-CM | POA: Insufficient documentation

## 2016-12-12 MED ORDER — ONDANSETRON HCL 4 MG/2ML IJ SOLN
4.0000 mg | Freq: Once | INTRAMUSCULAR | Status: AC
  Administered 2016-12-12: 4 mg via INTRAVENOUS
  Filled 2016-12-12: qty 2

## 2016-12-12 MED ORDER — METOCLOPRAMIDE HCL 5 MG/ML IJ SOLN
20.0000 mg | Freq: Once | INTRAVENOUS | Status: AC
  Administered 2016-12-12: 20 mg via INTRAVENOUS
  Filled 2016-12-12: qty 4

## 2016-12-12 MED ORDER — DIPHENHYDRAMINE HCL 50 MG/ML IJ SOLN
25.0000 mg | Freq: Once | INTRAMUSCULAR | Status: AC
  Administered 2016-12-12: 25 mg via INTRAVENOUS
  Filled 2016-12-12: qty 1

## 2016-12-12 MED ORDER — KETOROLAC TROMETHAMINE 15 MG/ML IJ SOLN
15.0000 mg | Freq: Once | INTRAMUSCULAR | Status: AC
  Administered 2016-12-12: 15 mg via INTRAVENOUS
  Filled 2016-12-12: qty 1

## 2016-12-12 NOTE — ED Provider Notes (Signed)
[facility] DEPT Provider Note   CSN: [id number] Arrival date & time: 12/12/16  2107     History   Chief Complaint Chief Complaint  Patient presents with  . Migraine  . Ankle Pain    HPI [name] is a 41 y.o. female with history of depression, chronic pain who is coming in today with headache as well as left ankle pain. Patient had left ankle surgery earlier today and had a few episodes of emesis this afternoon. States her headache feels like her normal migraines. Has not tried anything for it besides her normal pain medication she takes at home. No chest pain, shortness of breath, abdominal pain. No vision changes or altered mental status.  HPI  Past Medical History:  Diagnosis Date  . ADHD   . Anemia   . Anxiety   . Arthralgia   . Bimalleolar ankle fracture 07/19/2016   left  . Bipolar disorder (HCC)   . Chronic pain    hands, knees, back  . Depression   . Migraines   . Obesity     Patient Active Problem List   Diagnosis Date Noted  . Displaced bimalleolar fracture of left lower leg, subsequent encounter for closed fracture with delayed healing   . Closed high lateral malleolus fracture, left, initial encounter   . Anemia, unspecified 02/15/2013  . Pes anserine bursitis 01/20/2011  . Edema 08/21/2010  . Nonallopathic lesion of lumbar region 07/04/2010  . Obesity, unspecified 03/27/2010  . DEPRESSION, MAJOR, MILD 03/27/2010  . Chronic pain syndrome 03/27/2010  . NONALLOPATHIC LESION OF THORACIC REGION NEC 03/27/2010  . FATIGUE 03/27/2010  . HEADACHE 03/27/2010    Past Surgical History:  Procedure Laterality Date  . CERVICAL CONIZATION W/BX  10/30/2011   Procedure: CONIZATION CERVIX WITH BIOPSY;  Surgeon: Kathreen Cosier, MD;  Location: [facility];  Service: Gynecology;  Laterality: N/A;  . DILATION AND CURETTAGE OF UTERUS  10/30/2011   Procedure: DILATATION AND CURETTAGE;  Surgeon: Kathreen Cosier, MD;  Location: [facility];  Service: Gynecology;   Laterality: N/A;  . DILITATION & CURRETTAGE/HYSTROSCOPY WITH HYDROTHERMAL ABLATION N/A 12/14/2013   Procedure: DILATATION & CURETTAGE/HYSTEROSCOPY WITH HYDROTHERMAL ABLATION;  Surgeon: Kathreen Cosier, MD;  Location: [facility];  Service: Gynecology;  Laterality: N/A;  . ORIF ANKLE FRACTURE Left 07/23/2016   Procedure: OPEN REDUCTION INTERNAL FIXATION (ORIF) LEFT BIMALLEOLAR ANKLE FRACTURE;  Surgeon: Tarry Kos, MD;  Location: [facility];  Service: Orthopedics;  Laterality: Left;  . TUBAL LIGATION  07/12/2003  . WISDOM TOOTH EXTRACTION      OB History    No data available       Home Medications    Prior to Admission medications   Medication Sig Start Date End Date Taking? Authorizing Provider  alprazolam Prudy Feeler) 2 MG tablet Take 2 mg by mouth 2 (two) times daily as needed for anxiety.   Yes [provider]  amphetamine-dextroamphetamine (ADDERALL) 30 MG tablet Take 30 mg by mouth 2 (two) times daily. 01/02/16  Yes [provider]  bisacodyl (DULCOLAX) 10 MG suppository Place 1 suppository (10 mg total) rectally as needed for moderate constipation. 07/31/16  Yes Lawyer, Cristal Deer, PA-C  docusate sodium (COLACE) 100 MG capsule Take 1 capsule (100 mg total) by mouth every 12 (twelve) hours. Patient taking differently: Take 100 mg by mouth 2 (two) times daily as needed for mild constipation.  07/31/16  Yes Lawyer, Cristal Deer, PA-C  Fe Cbn-Fe Gluc-FA-B12-C-DSS (FERRALET 90) 90-1 MG TABS Take 1  tablet by mouth daily. 06/08/16  Yes [provider]  furosemide (LASIX) 40 MG tablet Take 40 mg by mouth daily as needed for fluid.   Yes [provider]  ibuprofen (ADVIL,MOTRIN) 800 MG tablet Take 800 mg by mouth every 8 (eight) hours as needed for moderate pain.    Yes [provider]  methocarbamol (ROBAXIN) 750 MG tablet Take 1 tablet (750 mg total) by mouth 2 (two) times daily as needed for muscle spasms. 07/23/16  Yes Tarry Kos, MD    mirtazapine (REMERON) 30 MG tablet Take 30 mg by mouth daily. 12/24/15  Yes [provider]  ondansetron (ZOFRAN ODT) 4 MG disintegrating tablet Take 1 tablet (4 mg total) by mouth every 8 (eight) hours as needed for nausea or vomiting. 07/29/16  Yes Bethel Born, PA-C  ondansetron (ZOFRAN) 4 MG tablet Take 1-2 tablets (4-8 mg total) by mouth every 8 (eight) hours as needed for nausea or vomiting. 08/07/16  Yes Tarry Kos, MD  ondansetron (ZOFRAN) 4 MG tablet Take 1-2 tablets (4-8 mg total) by mouth every 8 (eight) hours as needed for nausea or vomiting. 08/07/16  Yes Tarry Kos, MD  oxyCODONE (OXYCONTIN) 10 mg 12 hr tablet Take 1 tablet (10 mg total) by mouth every 12 (twelve) hours. 07/23/16  Yes Tarry Kos, MD  promethazine (PHENERGAN) 25 MG tablet Take 1 tablet (25 mg total) by mouth every 6 (six) hours as needed for nausea. 07/23/16  Yes Tarry Kos, MD  senna-docusate (SENOKOT S) 8.6-50 MG tablet Take 1 tablet by mouth at bedtime as needed. Patient taking differently: Take 1 tablet by mouth at bedtime as needed for mild constipation.  07/23/16  Yes Tarry Kos, MD    Family History Family History  Problem Relation Age of Onset  . Lupus Sister   . Diabetes type II Unknown   . Stroke Unknown     Social History Social History  Substance Use Topics  . Smoking status: Never Smoker  . Smokeless tobacco: Never Used  . Alcohol use No     Allergies   Buspar [buspirone]   Review of Systems Review of Systems  Constitutional: Negative for chills and fever.  HENT: Negative for ear pain and sore throat.   Eyes: Negative for pain and visual disturbance.  Respiratory: Negative for cough and shortness of breath.   Cardiovascular: Negative for chest pain and palpitations.  Gastrointestinal: Negative for abdominal pain and vomiting.  Endocrine: Negative for polyuria.  Genitourinary: Negative for dysuria and hematuria.  Musculoskeletal: Positive for gait problem.  Negative for arthralgias and back pain.  Skin: Negative for color change and rash.  Allergic/Immunologic: Negative for immunocompromised state.  Neurological: Positive for headaches. Negative for seizures and syncope.  Psychiatric/Behavioral: Negative for agitation and behavioral problems.  All other systems reviewed and are negative.    Physical Exam Updated Vital Signs BP (!) 148/85   Pulse (!) 108   Temp 98.3 F (36.8 C) (Oral)   Resp 18   Ht 5\' 6"  (1.676 m)   Wt 136.1 kg (300 lb)   LMP 11/24/2016 (Exact Date)   SpO2 98%   BMI 48.42 kg/m   Physical Exam  Constitutional: She is oriented to person, place, and time. She appears well-developed and well-nourished. No distress.  HENT:  Head: Normocephalic and atraumatic.  Mouth/Throat: Oropharynx is clear and moist. No oropharyngeal exudate.  Eyes: Pupils are equal, round, and reactive to light. Conjunctivae and EOM are normal.  Neck: Normal range of motion. Neck supple. No tracheal deviation present.  Cardiovascular: Normal rate and intact distal pulses.   No murmur heard. Pulmonary/Chest: Effort normal. No respiratory distress. She has no wheezes.  Abdominal: Soft. She exhibits no distension. There is no tenderness. There is no rebound and no guarding.  Musculoskeletal: She exhibits tenderness. She exhibits no deformity.  Left ankle with bandage in place and tender to palpation. Neurovascularly intact. Compartment soft and no pain elicited on passive motion of the great toe on the left side. Edema, streaking. Unremarkable post-op swelling.  Neurological: She is alert and oriented to person, place, and time. No cranial nerve deficit or sensory deficit. She exhibits normal muscle tone. Coordination normal.  Unremarkable Neurological exam. Cranial nerves II through XII are intact. Normal finger-nose. 5/5 strength in both upper and lower extremities. Sensation is intact throughout. Normal finger to nose. Able to ambulate.    Skin:  Skin is warm and dry. She is not diaphoretic.  Psychiatric: She has a normal mood and affect.     ED Treatments / Results  Labs (all labs ordered are listed, but only abnormal results are displayed) Labs Reviewed - No data to display  EKG  EKG Interpretation None       Radiology No results found.  Procedures Procedures (including critical care time)  Emergency Ultrasound Study:   Angiocath insertion Performed by: Orson SlickAndrew Dave Mannes  Consent: Verbal consent obtained. Risks and benefits: risks, benefits and alternatives were discussed Immediately prior to procedure the correct patient, procedure, equipment, support staff and site/side marked as needed.  Indication: difficult IV access Preparation: Patient was prepped and draped in the usual sterile fashion. Vein Location: right antecubital vein was visualized during assessment for potential access sites and was found to be patent/ easily compressed with linear ultrasound.  The needle was visualized with real-time ultrasound and guided into the vein. Gauge: 20  Image saved and stored.  Normal blood return.  Patient tolerance: Patient tolerated the procedure well with no immediate complications.      Medications Ordered in ED Medications  ondansetron (ZOFRAN) injection 4 mg (4 mg Intravenous Given 12/12/16 2245)  diphenhydrAMINE (BENADRYL) injection 25 mg (25 mg Intravenous Given 12/12/16 2249)  metoCLOPramide (REGLAN) 20 mg in dextrose 5 % 50 mL IVPB (0 mg Intravenous Stopped 12/12/16 2344)  ketorolac (TORADOL) 15 MG/ML injection 15 mg (15 mg Intravenous Given 12/12/16 2246)     Initial Impression / Assessment and Plan / ED Course  I have reviewed the triage vital signs and the nursing notes.  Pertinent labs & imaging results that were available during my care of the patient were reviewed by me and considered in my medical decision making (see chart for details).     Patient presenting with left ankle pain as well as a  migraine.  Compartments soft, neurovascularly intact. States this feels like her normal migraines. Unremarkable Neurological exam. Cranial nerves II through XII are intact. Normal finger-nose. 5/5 strength in both upper and lower extremities. Sensation is intact throughout. Normal finger to nose. Able to ambulate.  Do not believe any additional imaging is indicated to the ankle as she has had no trauma since the surgery. Doubt compartment syndrome or other acute abnormalities of the ankle. It is nonerythematous and not concerning for infection. Not meningitis this patient has no fevers, altered mental status, or stiff neck. Patient given migraine cocktail.  Upon recheck at 12:10 AM, patient sleeping and upon awakening states her headache feels much better and  she is ready to go home. Do believe this was migraine and she will be discharged in stable condition. Given strict return precautions and told to follow up with primary care provider in one week for recheck. She and her caregiver voiced understanding and agreement to the plan and were comfortable with outpatient management.  Patient was seen with my attending, Dr. Jodi Mourning, who voiced agreement and oversaw the evaluation and treatment of this patient.   Dragon Medical illustrator was used in the creation of this note. If there are any errors or inconsistencies needing clarification, please contact me directly.   Final Clinical Impressions(s) / ED Diagnoses   Final diagnoses:  Other migraine without status migrainosus, not intractable    New Prescriptions New Prescriptions   No medications on file     Orson Slick, MD 12/13/16 Darden Palmer, MD 12/13/16 [phone]

## 2016-12-12 NOTE — ED Notes (Signed)
IV team consulted- 

## 2016-12-12 NOTE — ED Notes (Signed)
Pt st's no relief from meds

## 2016-12-12 NOTE — Telephone Encounter (Signed)
yes

## 2016-12-12 NOTE — Telephone Encounter (Signed)
CALLED TO ADVISE ON ORDERS

## 2016-12-12 NOTE — ED Triage Notes (Signed)
Pt to ED via [facility] with c/o migraine headache onset this am and also pain in left ankle after having pins removed earlier today.  Pt also c/o nausea and vomiting.  Pt falling asleep while being triaged

## 2016-12-12 NOTE — ED Notes (Signed)
Pt resting quietly at this time with eyes closed.  Family at bedside 

## 2016-12-12 NOTE — ED Notes (Signed)
IV attempted x's 1 without success. 

## 2016-12-13 MED ORDER — ONDANSETRON HCL 4 MG PO TABS: 4.0000 mg | ORAL_TABLET | Freq: Three times a day (TID) | ORAL | 0 refills | Status: DC | PRN

## 2016-12-25 ENCOUNTER — Encounter (INDEPENDENT_AMBULATORY_CARE_PROVIDER_SITE_OTHER): Payer: Self-pay | Admitting: Orthopaedic Surgery

## 2016-12-25 ENCOUNTER — Ambulatory Visit (INDEPENDENT_AMBULATORY_CARE_PROVIDER_SITE_OTHER): Payer: Medicare Other | Admitting: Orthopaedic Surgery

## 2016-12-25 DIAGNOSIS — S8262XA Displaced fracture of lateral malleolus of left fibula, initial encounter for closed fracture: Secondary | ICD-10-CM

## 2016-12-25 DIAGNOSIS — S82842G Displaced bimalleolar fracture of left lower leg, subsequent encounter for closed fracture with delayed healing: Secondary | ICD-10-CM

## 2016-12-25 NOTE — Progress Notes (Signed)
Patient is 2 weeks status post syndesmotic screw removal. She is overall doing well. She is on oxycodone for chronic pain through her pain management clinic. Her incision is healed. The sutures were removed. Continue with home health physical therapy. Weight-bear as tolerated. Left ASO brace was provided today. Follow-up as needed. Questions encouraged and answered.

## 2017-01-16 ENCOUNTER — Telehealth (INDEPENDENT_AMBULATORY_CARE_PROVIDER_SITE_OTHER): Payer: Self-pay | Admitting: Radiology

## 2017-01-16 NOTE — Telephone Encounter (Signed)
Called Patient and she states the same thing as therapist. Would like to know if she can get a STAT MRI?    Patient states she is in pain clinic but they are not treating her for her ankle/hip. I advised her that if she was in severe pain and couldn't get in with [place] then she would have to go to the ER

## 2017-01-16 NOTE — Telephone Encounter (Signed)
Shelly called LM VM stating that patient is having 9 out of 10 pain in the left groin/hip, and trouble walking.  She says patient needs an appt to be seen soon. Can you call her at [phone], thanks

## 2017-01-21 ENCOUNTER — Telehealth (INDEPENDENT_AMBULATORY_CARE_PROVIDER_SITE_OTHER): Payer: Self-pay | Admitting: *Deleted

## 2017-01-21 NOTE — Telephone Encounter (Signed)
[facility] needing VO for pt to do ongoing PT 2x/week for 3 weeks for LBP and ankle tx, I called and Marisue IvanLiz and stated is ok to approve this order, I got back on phone with Corrie DandyMary and gave her the approval.

## 2017-02-02 NOTE — Telephone Encounter (Signed)
No MRI.  With hardware in there, it won't show anything.  I prefer to see her first

## 2017-02-02 NOTE — Telephone Encounter (Signed)
Patient called regarding this message. Do you want her to have MRI done or not?  Also Mary HHPT called and said if you are releasing patient then HHPT will have to end and start a new one with PCP. Please advise if you are or not releasing patient?  CB MARY: [phone]

## 2017-02-03 NOTE — Telephone Encounter (Signed)
appt made for thursday

## 2017-02-05 ENCOUNTER — Ambulatory Visit (INDEPENDENT_AMBULATORY_CARE_PROVIDER_SITE_OTHER): Payer: Medicare Other | Admitting: Orthopaedic Surgery

## 2017-02-05 ENCOUNTER — Ambulatory Visit (INDEPENDENT_AMBULATORY_CARE_PROVIDER_SITE_OTHER): Payer: Medicare Other

## 2017-02-05 ENCOUNTER — Encounter (INDEPENDENT_AMBULATORY_CARE_PROVIDER_SITE_OTHER): Payer: Self-pay | Admitting: Orthopaedic Surgery

## 2017-02-05 DIAGNOSIS — M25552 Pain in left hip: Secondary | ICD-10-CM | POA: Diagnosis not present

## 2017-02-05 NOTE — Progress Notes (Signed)
Office Visit Note   Patient: [name]           Date of Birth: 1976-04-14           MRN: [id number] Visit Date: 02/05/2017              Requested by: Fleet ContrasAvbuere, Edwin, MD [address] [place], [place] [id number] PCP: Fleet ContrasAvbuere, Edwin, MD   Assessment & Plan: Visit Diagnoses:  1. Pain in left hip     Plan: Patient has continued with hip pain since the ankle injury. Recommend MRI to rule out structural abnormalities. Overall patient has anxiety and bipolar disorder that complicates her recovery. Total face to face encounter time was greater than 25 minutes and over half of this time was spent in counseling and/or coordination of care.  Follow-Up Instructions: Return in about 2 weeks (around 02/19/2017).   Orders:  Orders Placed This Encounter  Procedures  . XR HIP UNILAT W OR W/O PELVIS 2-3 VIEWS LEFT   No orders of the defined types were placed in this encounter.     Procedures: No procedures performed   Clinical Data: No additional findings.   Subjective: Chief Complaint  Patient presents with  . Left Ankle - Pain  . Left Hip - Pain    Patient follows up today for her left hip pain. She continues to work with home health physical therapy. She is endorse groin pain. She states her hip and pelvis want to shift out of place. She endorses tingling burning that is worse at night.    Review of Systems  Constitutional: Negative.   HENT: Negative.   Eyes: Negative.   Respiratory: Negative.   Cardiovascular: Negative.   Endocrine: Negative.   Musculoskeletal: Negative.   Neurological: Negative.   Hematological: Negative.   Psychiatric/Behavioral: Negative.   All other systems reviewed and are negative.    Objective: Vital Signs: There were no vitals taken for this visit.  Physical Exam  Constitutional: She is oriented to person, place, and time. She appears well-developed and well-nourished.  Pulmonary/Chest: Effort normal.  Neurological: She is alert  and oriented to person, place, and time.  Skin: Skin is warm. Capillary refill takes less than 2 seconds.  Psychiatric: She has a normal mood and affect. Her behavior is normal. Judgment and thought content normal.  Nursing note and vitals reviewed.   Ortho Exam Morbidly obese Left hip rotation is painless. She has mild tenderness in the proximal anterior thigh in the hip flexors. No sciatic tension signs. Negative [place]. Specialty Comments:  No specialty comments available.  Imaging: Xr Hip Unilat W Or W/o Pelvis 2-3 Views Left  Result Date: 02/05/2017 No acute or structural abnormalities    PMFS History: Patient Active Problem List   Diagnosis Date Noted  . Displaced bimalleolar fracture of left lower leg, subsequent encounter for closed fracture with delayed healing   . Closed high lateral malleolus fracture, left, initial encounter   . Anemia, unspecified 02/15/2013  . Pes anserine bursitis 01/20/2011  . Edema 08/21/2010  . Nonallopathic lesion of lumbar region 07/04/2010  . Obesity, unspecified 03/27/2010  . DEPRESSION, MAJOR, MILD 03/27/2010  . Chronic pain syndrome 03/27/2010  . NONALLOPATHIC LESION OF THORACIC REGION NEC 03/27/2010  . FATIGUE 03/27/2010  . HEADACHE 03/27/2010   Past Medical History:  Diagnosis Date  . ADHD   . Anemia   . Anxiety   . Arthralgia   . Bimalleolar ankle fracture 07/19/2016   left  . Bipolar disorder (  HCC)   . Chronic pain    hands, knees, back  . Depression   . Migraines   . Obesity     Family History  Problem Relation Age of Onset  . Lupus Sister   . Diabetes type II Unknown   . Stroke Unknown     Past Surgical History:  Procedure Laterality Date  . CERVICAL CONIZATION W/BX  10/30/2011   Procedure: CONIZATION CERVIX WITH BIOPSY;  Surgeon: Kathreen Cosier, MD;  Location: [facility];  Service: Gynecology;  Laterality: N/A;  . DILATION AND CURETTAGE OF UTERUS  10/30/2011   Procedure: DILATATION AND CURETTAGE;  Surgeon:  Kathreen Cosier, MD;  Location: [facility];  Service: Gynecology;  Laterality: N/A;  . DILITATION & CURRETTAGE/HYSTROSCOPY WITH HYDROTHERMAL ABLATION N/A 12/14/2013   Procedure: DILATATION & CURETTAGE/HYSTEROSCOPY WITH HYDROTHERMAL ABLATION;  Surgeon: Kathreen Cosier, MD;  Location: [facility];  Service: Gynecology;  Laterality: N/A;  . ORIF ANKLE FRACTURE Left 07/23/2016   Procedure: OPEN REDUCTION INTERNAL FIXATION (ORIF) LEFT BIMALLEOLAR ANKLE FRACTURE;  Surgeon: Tarry Kos, MD;  Location: [facility];  Service: Orthopedics;  Laterality: Left;  . TUBAL LIGATION  07/12/2003  . WISDOM TOOTH EXTRACTION     Social History   Occupational History  . Not on file.   Social History Main Topics  . Smoking status: Never Smoker  . Smokeless tobacco: Never Used  . Alcohol use No  . Drug use: No  . Sexual activity: Yes    Partners: Male    Birth control/ protection: Surgical

## 2017-02-09 ENCOUNTER — Telehealth (INDEPENDENT_AMBULATORY_CARE_PROVIDER_SITE_OTHER): Payer: Self-pay

## 2017-02-09 NOTE — Telephone Encounter (Signed)
Please advise 

## 2017-02-09 NOTE — Telephone Encounter (Signed)
Mary with [facility] would like duration and frequency for 2 x week for 8 weeks for patient.  Cb# is [phone].  Please advise.  Thank You.

## 2017-02-09 NOTE — Telephone Encounter (Signed)
yes

## 2017-02-09 NOTE — Telephone Encounter (Signed)
Called Mary to advise, no answer LMOM. Dr Roda Shutters approved orders.

## 2017-02-19 ENCOUNTER — Other Ambulatory Visit (INDEPENDENT_AMBULATORY_CARE_PROVIDER_SITE_OTHER): Payer: Self-pay

## 2017-02-19 DIAGNOSIS — M25552 Pain in left hip: Secondary | ICD-10-CM

## 2017-02-27 ENCOUNTER — Ambulatory Visit: Payer: Medicare Other

## 2017-02-27 ENCOUNTER — Encounter: Payer: Medicare Other | Admitting: Podiatry

## 2017-03-09 ENCOUNTER — Other Ambulatory Visit: Payer: Self-pay

## 2017-03-11 ENCOUNTER — Ambulatory Visit
Admission: RE | Admit: 2017-03-11 | Discharge: 2017-03-11 | Disposition: A | Discharge status: Home / Self Care | Payer: Medicare Other | Source: Ambulatory Visit | Attending: Orthopaedic Surgery | Admitting: Orthopaedic Surgery

## 2017-03-11 DIAGNOSIS — M25552 Pain in left hip: Secondary | ICD-10-CM

## 2017-03-13 ENCOUNTER — Ambulatory Visit (INDEPENDENT_AMBULATORY_CARE_PROVIDER_SITE_OTHER): Payer: Medicare Other | Admitting: Orthopaedic Surgery

## 2017-03-13 DIAGNOSIS — M1612 Unilateral primary osteoarthritis, left hip: Secondary | ICD-10-CM | POA: Diagnosis not present

## 2017-03-13 NOTE — Progress Notes (Signed)
Office Visit Note   Patient: [name]           Date of Birth: 05/21/1976           MRN: [id number] Visit Date: 03/13/2017              Requested by: Fleet Contras, MD [address] [place], [place] [address] PCP: Fleet Contras, MD   Assessment & Plan: Visit Diagnoses:  1. Primary osteoarthritis of left hip     Plan: MRI mainly shows some mild chondrosis of the hip joint.  No other findings.  I discussed the importance of weight loss and lifestyle changes.  Patient did not seem too accepting of that advice.  Follow-up as needed.  Follow-Up Instructions: Return if symptoms worsen or fail to improve.   Orders:  No orders of the defined types were placed in this encounter.  No orders of the defined types were placed in this encounter.     Procedures: No procedures performed   Clinical Data: No additional findings.   Subjective: No chief complaint on file.   Patient follows up today to review her left hip MRI.  She is walking with a cane today.  Denies any changes in medical history.    Review of Systems   Objective: Vital Signs: There were no vitals taken for this visit.  Physical Exam  Ortho Exam Left hip exam is stable. Specialty Comments:  No specialty comments available.  Imaging: No results found.   PMFS History: Patient Active Problem List   Diagnosis Date Noted  . Displaced bimalleolar fracture of left lower leg, subsequent encounter for closed fracture with delayed healing   . Closed high lateral malleolus fracture, left, initial encounter   . Anemia, unspecified 02/15/2013  . Pes anserine bursitis 01/20/2011  . Edema 08/21/2010  . Nonallopathic lesion of lumbar region 07/04/2010  . Obesity, unspecified 03/27/2010  . DEPRESSION, MAJOR, MILD 03/27/2010  . Chronic pain syndrome 03/27/2010  . NONALLOPATHIC LESION OF THORACIC REGION NEC 03/27/2010  . FATIGUE 03/27/2010  . HEADACHE 03/27/2010   Past Medical History:  Diagnosis  Date  . ADHD   . Anemia   . Anxiety   . Arthralgia   . Bimalleolar ankle fracture 07/19/2016   left  . Bipolar disorder (HCC)   . Chronic pain    hands, knees, back  . Depression   . Migraines   . Obesity     Family History  Problem Relation Age of Onset  . Lupus Sister   . Diabetes type II Unknown   . Stroke Unknown     Past Surgical History:  Procedure Laterality Date  . CERVICAL CONIZATION W/BX  10/30/2011   Procedure: CONIZATION CERVIX WITH BIOPSY;  Surgeon: Kathreen Cosier, MD;  Location: [facility];  Service: Gynecology;  Laterality: N/A;  . DILATION AND CURETTAGE OF UTERUS  10/30/2011   Procedure: DILATATION AND CURETTAGE;  Surgeon: Kathreen Cosier, MD;  Location: [facility];  Service: Gynecology;  Laterality: N/A;  . DILITATION & CURRETTAGE/HYSTROSCOPY WITH HYDROTHERMAL ABLATION N/A 12/14/2013   Procedure: DILATATION & CURETTAGE/HYSTEROSCOPY WITH HYDROTHERMAL ABLATION;  Surgeon: Kathreen Cosier, MD;  Location: [facility];  Service: Gynecology;  Laterality: N/A;  . ORIF ANKLE FRACTURE Left 07/23/2016   Procedure: OPEN REDUCTION INTERNAL FIXATION (ORIF) LEFT BIMALLEOLAR ANKLE FRACTURE;  Surgeon: Tarry Kos, MD;  Location: [facility];  Service: Orthopedics;  Laterality: Left;  . TUBAL LIGATION  07/12/2003  . WISDOM TOOTH EXTRACTION  Social History   Occupational History  . Not on file.   Social History Main Topics  . Smoking status: Never Smoker  . Smokeless tobacco: Never Used  . Alcohol use No  . Drug use: No  . Sexual activity: Yes    Partners: Male    Birth control/ protection: Surgical

## 2017-03-25 NOTE — Progress Notes (Signed)
This encounter was created in error - please disregard.

## 2017-06-10 ENCOUNTER — Other Ambulatory Visit (INDEPENDENT_AMBULATORY_CARE_PROVIDER_SITE_OTHER): Payer: Self-pay | Admitting: Orthopaedic Surgery

## 2018-02-10 ENCOUNTER — Ambulatory Visit: Payer: Self-pay | Admitting: Obstetrics & Gynecology

## 2018-02-10 NOTE — Progress Notes (Deleted)
   Patient did not show up today for her scheduled appointment.   Larnell Granlund, MD, FACOG Obstetrician & Gynecologist, [facility], [facility]  
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
## 2018-03-06 ENCOUNTER — Encounter (HOSPITAL_COMMUNITY): Payer: Self-pay | Admitting: Emergency Medicine

## 2018-03-06 ENCOUNTER — Emergency Department (HOSPITAL_COMMUNITY)
Admission: EM | Admit: 2018-03-06 | Discharge: 2018-03-06 | Disposition: A | Discharge status: Home / Self Care | Payer: Medicare Other | Attending: Emergency Medicine | Admitting: Emergency Medicine

## 2018-03-06 ENCOUNTER — Other Ambulatory Visit: Payer: Self-pay

## 2018-03-06 ENCOUNTER — Emergency Department (HOSPITAL_COMMUNITY): Payer: Medicare Other

## 2018-03-06 DIAGNOSIS — R42 Dizziness and giddiness: Secondary | ICD-10-CM | POA: Insufficient documentation

## 2018-03-06 DIAGNOSIS — Z79899 Other long term (current) drug therapy: Secondary | ICD-10-CM | POA: Diagnosis not present

## 2018-03-06 DIAGNOSIS — M25561 Pain in right knee: Secondary | ICD-10-CM | POA: Insufficient documentation

## 2018-03-06 DIAGNOSIS — M25572 Pain in left ankle and joints of left foot: Secondary | ICD-10-CM

## 2018-03-06 DIAGNOSIS — R1084 Generalized abdominal pain: Secondary | ICD-10-CM | POA: Diagnosis not present

## 2018-03-06 DIAGNOSIS — R110 Nausea: Secondary | ICD-10-CM | POA: Diagnosis not present

## 2018-03-06 DIAGNOSIS — M25562 Pain in left knee: Secondary | ICD-10-CM | POA: Insufficient documentation

## 2018-03-06 DIAGNOSIS — R0789 Other chest pain: Secondary | ICD-10-CM | POA: Diagnosis not present

## 2018-03-06 LAB — I-STAT CHEM 8, ED
BUN: 10 mg/dL (ref 6–20)
CREATININE: 0.5 mg/dL (ref 0.44–1.00)
Calcium, Ion: 1.17 mmol/L (ref 1.15–1.40)
Chloride: 106 mmol/L (ref 98–111)
GLUCOSE: 94 mg/dL (ref 70–99)
HEMATOCRIT: 36 % (ref 36.0–46.0)
Hemoglobin: 12.2 g/dL (ref 12.0–15.0)
Potassium: 3.9 mmol/L (ref 3.5–5.1)
Sodium: 140 mmol/L (ref 135–145)
TCO2: 26 mmol/L (ref 22–32)

## 2018-03-06 LAB — CBC
HCT: 40.7 % (ref 36.0–46.0)
Hemoglobin: 11.7 g/dL — ABNORMAL LOW (ref 12.0–15.0)
MCH: 19.7 pg — AB (ref 26.0–34.0)
MCHC: 28.7 g/dL — AB (ref 30.0–36.0)
MCV: 68.5 fL — AB (ref 80.0–100.0)
Platelets: 233 10*3/uL (ref 150–400)
RBC: 5.94 MIL/uL — AB (ref 3.87–5.11)
RDW: 17.9 % — ABNORMAL HIGH (ref 11.5–15.5)
WBC: 7.9 10*3/uL (ref 4.0–10.5)
nRBC: 0 % (ref 0.0–0.2)

## 2018-03-06 LAB — I-STAT BETA HCG BLOOD, ED (MC, WL, AP ONLY): I-stat hCG, quantitative: <5 m[IU]/mL (ref <5)

## 2018-03-06 MED ORDER — OXYCODONE-ACETAMINOPHEN 5-325 MG PO TABS
2.0000 | ORAL_TABLET | Freq: Once | ORAL | Status: AC
  Administered 2018-03-06: 2 via ORAL
  Filled 2018-03-06: qty 2

## 2018-03-06 MED ORDER — MORPHINE SULFATE (PF) 4 MG/ML IV SOLN
4.0000 mg | Freq: Once | INTRAVENOUS | Status: AC
  Administered 2018-03-06: 4 mg via INTRAVENOUS
  Filled 2018-03-06: qty 1

## 2018-03-06 MED ORDER — IOHEXOL 300 MG/ML  SOLN
100.0000 mL | Freq: Once | INTRAMUSCULAR | Status: AC | PRN
  Administered 2018-03-06: 100 mL via INTRAVENOUS

## 2018-03-06 MED ORDER — ONDANSETRON HCL 4 MG/2ML IJ SOLN
4.0000 mg | Freq: Once | INTRAMUSCULAR | Status: AC
  Administered 2018-03-06: 4 mg via INTRAVENOUS
  Filled 2018-03-06: qty 2
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
## 2018-03-06 NOTE — ED Notes (Signed)
Patient transported to X-ray 

## 2018-03-06 NOTE — ED Notes (Signed)
ED Provider at bedside. 

## 2018-03-06 NOTE — ED Notes (Signed)
Pt ambulated to bathroom across the hall without assistance, steady gait.

## 2018-03-06 NOTE — ED Notes (Signed)
Pt. To CT

## 2018-03-06 NOTE — ED Notes (Signed)
Patient verbalizes understanding of discharge instructions. Opportunity for questioning and answers were provided. Pt discharged from ED. 
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
## 2018-03-06 NOTE — ED Notes (Signed)
Pt. Called out asking for her nurse when told her nurse is on lunch and other nurse that can help her is in with another patient, pt. Said "what the hell" pt. Provided with extra warm blankets per her family member request.

## 2018-03-06 NOTE — ED Notes (Signed)
Pt. Called out asking for her nurse and stating that she feels that she if going to vomit
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
## 2018-03-07 ENCOUNTER — Encounter (HOSPITAL_COMMUNITY): Payer: Self-pay | Admitting: *Deleted

## 2018-03-07 ENCOUNTER — Other Ambulatory Visit: Payer: Self-pay

## 2018-03-07 ENCOUNTER — Emergency Department (HOSPITAL_COMMUNITY)
Admission: EM | Admit: 2018-03-07 | Discharge: 2018-03-07 | Disposition: A | Discharge status: Home / Self Care | Payer: Medicare Other | Attending: Emergency Medicine | Admitting: Emergency Medicine

## 2018-03-07 DIAGNOSIS — M791 Myalgia, unspecified site: Secondary | ICD-10-CM | POA: Insufficient documentation

## 2018-03-07 DIAGNOSIS — Z79899 Other long term (current) drug therapy: Secondary | ICD-10-CM | POA: Diagnosis not present

## 2018-03-07 DIAGNOSIS — M7918 Myalgia, other site: Secondary | ICD-10-CM

## 2018-03-07 DIAGNOSIS — F909 Attention-deficit hyperactivity disorder, unspecified type: Secondary | ICD-10-CM | POA: Insufficient documentation

## 2018-03-07 MED ORDER — NAPROXEN 250 MG PO TABS
500.0000 mg | ORAL_TABLET | Freq: Once | ORAL | Status: AC
  Administered 2018-03-07: 500 mg via ORAL
  Filled 2018-03-07: qty 2

## 2018-03-07 MED ORDER — NAPROXEN 500 MG PO TABS: 500.0000 mg | ORAL_TABLET | Freq: Two times a day (BID) | ORAL | 0 refills | Status: DC

## 2018-03-07 NOTE — Discharge Instructions (Signed)
As we discussed expect to be sore and stiff over the next few days.  If he is continued to have right shoulder stiffness after about a week I would make an appointment to follow back up with [facility] orthopedics.  Take your pain medicine as needed and take the Naprosyn as directed for the next week.  Return for any new or worse symptoms.
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
## 2018-03-09 LAB — I-STAT CHEM 8, ED
BUN: 10 mg/dL (ref 6–20)
CHLORIDE: 106 mmol/L (ref 98–111)
Calcium, Ion: 1.21 mmol/L (ref 1.15–1.40)
Creatinine, Ser: 0.6 mg/dL (ref 0.44–1.00)
Glucose, Bld: 92 mg/dL (ref 70–99)
HEMATOCRIT: 39 % (ref 36.0–46.0)
Hemoglobin: 13.3 g/dL (ref 12.0–15.0)
POTASSIUM: 3.9 mmol/L (ref 3.5–5.1)
Sodium: 140 mmol/L (ref 135–145)
TCO2: 25 mmol/L (ref 22–32)

## 2018-03-14 ENCOUNTER — Emergency Department (HOSPITAL_COMMUNITY)
Admission: EM | Admit: 2018-03-14 | Discharge: 2018-03-14 | Disposition: A | Discharge status: Home / Self Care | Payer: Medicare Other | Attending: Emergency Medicine | Admitting: Emergency Medicine

## 2018-03-14 ENCOUNTER — Encounter (HOSPITAL_COMMUNITY): Payer: Self-pay | Admitting: Emergency Medicine

## 2018-03-14 DIAGNOSIS — M7918 Myalgia, other site: Secondary | ICD-10-CM

## 2018-03-14 DIAGNOSIS — Z79899 Other long term (current) drug therapy: Secondary | ICD-10-CM | POA: Insufficient documentation

## 2018-03-14 DIAGNOSIS — F909 Attention-deficit hyperactivity disorder, unspecified type: Secondary | ICD-10-CM | POA: Diagnosis not present

## 2018-03-14 DIAGNOSIS — R51 Headache: Secondary | ICD-10-CM | POA: Diagnosis not present

## 2018-03-14 DIAGNOSIS — R519 Headache, unspecified: Secondary | ICD-10-CM

## 2018-03-14 DIAGNOSIS — F1120 Opioid dependence, uncomplicated: Secondary | ICD-10-CM | POA: Diagnosis not present

## 2018-03-14 DIAGNOSIS — F1190 Opioid use, unspecified, uncomplicated: Secondary | ICD-10-CM

## 2018-03-14 MED ORDER — METOCLOPRAMIDE HCL 5 MG/ML IJ SOLN
10.0000 mg | Freq: Once | INTRAMUSCULAR | Status: AC
  Administered 2018-03-14: 10 mg via INTRAMUSCULAR
  Filled 2018-03-14: qty 2

## 2018-03-14 MED ORDER — METHOCARBAMOL 500 MG PO TABS: 1000.0000 mg | ORAL_TABLET | Freq: Four times a day (QID) | ORAL | 0 refills | Status: DC

## 2018-03-14 MED ORDER — KETOROLAC TROMETHAMINE 15 MG/ML IJ SOLN
15.0000 mg | Freq: Once | INTRAMUSCULAR | Status: AC
  Administered 2018-03-14: 15 mg via INTRAMUSCULAR
  Filled 2018-03-14: qty 1

## 2018-03-14 MED ORDER — DIPHENHYDRAMINE HCL 25 MG PO CAPS
25.0000 mg | ORAL_CAPSULE | Freq: Once | ORAL | Status: AC
  Administered 2018-03-14: 25 mg via ORAL
  Filled 2018-03-14: qty 1
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
## 2018-03-14 NOTE — ED Notes (Signed)
Applied arm sling to pt's right arm.

## 2018-03-23 ENCOUNTER — Other Ambulatory Visit: Payer: Self-pay | Admitting: Internal Medicine

## 2018-03-23 DIAGNOSIS — Z1231 Encounter for screening mammogram for malignant neoplasm of breast: Secondary | ICD-10-CM

## 2018-03-30 ENCOUNTER — Ambulatory Visit (INDEPENDENT_AMBULATORY_CARE_PROVIDER_SITE_OTHER): Payer: Medicare Other | Admitting: Orthopaedic Surgery

## 2018-04-30 ENCOUNTER — Ambulatory Visit
Admission: RE | Admit: 2018-04-30 | Discharge: 2018-04-30 | Disposition: A | Discharge status: Home / Self Care | Payer: Medicare Other | Source: Ambulatory Visit | Attending: Internal Medicine | Admitting: Internal Medicine

## 2018-04-30 DIAGNOSIS — Z1231 Encounter for screening mammogram for malignant neoplasm of breast: Secondary | ICD-10-CM

## 2018-06-22 ENCOUNTER — Other Ambulatory Visit: Payer: Self-pay | Admitting: Sports Medicine

## 2018-06-22 DIAGNOSIS — M25511 Pain in right shoulder: Secondary | ICD-10-CM

## 2018-07-02 ENCOUNTER — Other Ambulatory Visit: Payer: Self-pay | Admitting: Sports Medicine

## 2018-07-02 ENCOUNTER — Ambulatory Visit
Admission: RE | Admit: 2018-07-02 | Discharge: 2018-07-02 | Disposition: A | Discharge status: Home / Self Care | Payer: Medicare Other | Source: Ambulatory Visit | Attending: Sports Medicine | Admitting: Sports Medicine

## 2018-07-02 DIAGNOSIS — M25511 Pain in right shoulder: Secondary | ICD-10-CM

## 2018-07-03 ENCOUNTER — Ambulatory Visit
Admission: RE | Admit: 2018-07-03 | Discharge: 2018-07-03 | Disposition: A | Discharge status: Home / Self Care | Payer: Medicare Other | Source: Ambulatory Visit | Attending: Sports Medicine | Admitting: Sports Medicine

## 2018-07-03 DIAGNOSIS — M25511 Pain in right shoulder: Secondary | ICD-10-CM

## 2019-11-03 ENCOUNTER — Encounter (HOSPITAL_COMMUNITY): Payer: Self-pay | Admitting: Emergency Medicine

## 2019-11-03 ENCOUNTER — Emergency Department (HOSPITAL_COMMUNITY)
Admission: EM | Admit: 2019-11-03 | Discharge: 2019-11-03 | Disposition: A | Discharge status: Home / Self Care | Payer: Medicare Other | Attending: Emergency Medicine | Admitting: Emergency Medicine

## 2019-11-03 ENCOUNTER — Other Ambulatory Visit: Payer: Self-pay

## 2019-11-03 ENCOUNTER — Emergency Department (HOSPITAL_COMMUNITY): Payer: Medicare Other

## 2019-11-03 DIAGNOSIS — S92535A Nondisplaced fracture of distal phalanx of left lesser toe(s), initial encounter for closed fracture: Secondary | ICD-10-CM | POA: Diagnosis not present

## 2019-11-03 DIAGNOSIS — W19XXXA Unspecified fall, initial encounter: Secondary | ICD-10-CM | POA: Diagnosis not present

## 2019-11-03 DIAGNOSIS — Y9301 Activity, walking, marching and hiking: Secondary | ICD-10-CM | POA: Insufficient documentation

## 2019-11-03 DIAGNOSIS — S92502A Displaced unspecified fracture of left lesser toe(s), initial encounter for closed fracture: Secondary | ICD-10-CM

## 2019-11-03 DIAGNOSIS — M25562 Pain in left knee: Secondary | ICD-10-CM | POA: Diagnosis not present

## 2019-11-03 DIAGNOSIS — Y999 Unspecified external cause status: Secondary | ICD-10-CM | POA: Diagnosis not present

## 2019-11-03 DIAGNOSIS — Y92019 Unspecified place in single-family (private) house as the place of occurrence of the external cause: Secondary | ICD-10-CM | POA: Insufficient documentation

## 2019-11-03 DIAGNOSIS — S99922A Unspecified injury of left foot, initial encounter: Secondary | ICD-10-CM | POA: Diagnosis present

## 2019-11-03 MED ORDER — KETOROLAC TROMETHAMINE 30 MG/ML IJ SOLN
30.0000 mg | Freq: Once | INTRAMUSCULAR | Status: AC
  Administered 2019-11-03: 30 mg via INTRAMUSCULAR
  Filled 2019-11-03: qty 1

## 2019-11-03 NOTE — ED Triage Notes (Signed)
Pt BIB [facility] from home for mechanical fall. Pt landed on L knee. Reports pain to L knee, L ankle, and L pinky toe. 150 mcg fentanyl given in route. Hs of chronic anemia and L ankle surgery.

## 2019-11-03 NOTE — Progress Notes (Signed)
Orthopedic Tech Progress Note Patient Details:  TONEA LEIPHART 1976-04-13 [id number]  Ortho Devices Type of Ortho Device: Postop shoe/boot Ortho Device/Splint Location: LLE Ortho Device/Splint Interventions: Application   Post Interventions Patient Tolerated: Well Instructions Provided: Adjustment of device, Poper ambulation with device   Farooq Petrovich E Astrid Vides 11/03/2019, 7:55 PM

## 2019-11-03 NOTE — ED Provider Notes (Signed)
[facility] EMERGENCY DEPARTMENT Provider Note   CSN: [id number] Arrival date & time: 11/03/19  1625     History Chief Complaint  Patient presents with   Lytle Michaels    [name] is a 44 y.o. female.  HPI    Patient presents with pain in left knee, left ankle. Patient has a history of prior surgery in the left ankle, injuries to the left knee. Today she had a minor fall, and since that time has had pain in the knee, ankle. Some improvement with fentanyl, heat pad. No other injuries, no other trauma, no other complaints per She arrives via EMS providers and history is obtained by those individuals as well as the patient herself. No distal loss of sensation or weakness.    Past Medical History:  Diagnosis Date   ADHD    Anemia    Anxiety    Arthralgia    Bimalleolar ankle fracture 07/19/2016   left   Bipolar disorder (HCC)    Chronic pain    hands, knees, back   Depression    Migraines    Obesity     Patient Active Problem List   Diagnosis Date Noted   Displaced bimalleolar fracture of left lower leg, subsequent encounter for closed fracture with delayed healing    Closed high lateral malleolus fracture, left, initial encounter    Anemia, unspecified 02/15/2013   Pes anserine bursitis 01/20/2011   Edema 08/21/2010   Nonallopathic lesion of lumbar region 07/04/2010   Obesity, unspecified 03/27/2010   DEPRESSION, MAJOR, MILD 03/27/2010   Chronic pain syndrome 03/27/2010   NONALLOPATHIC LESION OF THORACIC REGION NEC 03/27/2010   FATIGUE 03/27/2010   HEADACHE 03/27/2010    Past Surgical History:  Procedure Laterality Date   CERVICAL CONIZATION W/BX  10/30/2011   Procedure: CONIZATION CERVIX WITH BIOPSY;  Surgeon: Frederico Hamman, MD;  Location: [facility];  Service: Gynecology;  Laterality: N/A;   DILATION AND CURETTAGE OF UTERUS  10/30/2011   Procedure: DILATATION AND CURETTAGE;  Surgeon: Frederico Hamman, MD;   Location: [place] [facility];  Service: Gynecology;  Laterality: N/A;   DILITATION & CURRETTAGE/HYSTROSCOPY WITH HYDROTHERMAL ABLATION N/A 12/14/2013   Procedure: DILATATION & CURETTAGE/HYSTEROSCOPY WITH HYDROTHERMAL ABLATION;  Surgeon: Frederico Hamman, MD;  Location: [facility];  Service: Gynecology;  Laterality: N/A;   ORIF ANKLE FRACTURE Left 07/23/2016   Procedure: OPEN REDUCTION INTERNAL FIXATION (ORIF) LEFT BIMALLEOLAR ANKLE FRACTURE;  Surgeon: Leandrew Koyanagi, MD;  Location: [place];  Service: Orthopedics;  Laterality: Left;   TUBAL LIGATION  07/12/2003   WISDOM TOOTH EXTRACTION       OB History   No obstetric history on file.     Family History  Problem Relation Age of Onset   Lupus Sister    Diabetes type II Unknown    Stroke Unknown     Social History   Tobacco Use   Smoking status: Never Smoker   Smokeless tobacco: Never Used  Substance Use Topics   Alcohol use: No   Drug use: No    Home Medications Prior to Admission medications   Medication Sig Start Date End Date Taking? Authorizing Provider  alprazolam Duanne Moron) 2 MG tablet Take 2 mg by mouth 2 (two) times daily as needed for anxiety.     [provider]  amphetamine-dextroamphetamine (ADDERALL) 30 MG tablet Take 30 mg by mouth 2 (two) times daily. 01/02/16   [provider]  citalopram (CELEXA) 40 MG tablet Take  40 mg by mouth at bedtime. 01/06/18   [provider]  Fe Cbn-Fe Gluc-FA-B12-C-DSS (FERRALET 90) 90-1 MG TABS Take 1 tablet by mouth daily. 06/08/16   [provider]  methocarbamol (ROBAXIN) 500 MG tablet Take 2 tablets (1,000 mg total) by mouth 4 (four) times daily. 03/14/18   Renne Crigler, PA-C  mirtazapine (REMERON) 30 MG tablet Take 30 mg by mouth daily. 12/24/15   [provider]  naproxen (NAPROSYN) 500 MG tablet Take 1 tablet (500 mg total) by mouth 2 (two) times daily. 03/07/18   Vanetta Mulders, MD  XTAMPZA ER 18 MG C12A Take 18 mg by mouth  every 12 (twelve) hours. 02/26/18   [provider]    Allergies    Buspar [buspirone]  Review of Systems   Review of Systems  Constitutional:       Per HPI, otherwise negative  HENT:       Per HPI, otherwise negative  Respiratory:       Per HPI, otherwise negative  Cardiovascular:       Per HPI, otherwise negative  Gastrointestinal: Negative for vomiting.  Endocrine:       Negative aside from HPI  Genitourinary:       Neg aside from HPI   Musculoskeletal:       Per HPI, otherwise negative  Skin: Negative.   Neurological: Negative for syncope, weakness and numbness.    Physical Exam Updated Vital Signs BP 102/90 (BP Location: Right Wrist)    Pulse 98    Temp 98.4 F (36.9 C) (Oral)    Resp 18    Ht 5\' 6"  (1.676 m)    Wt (!) 145.2 kg    SpO2 98%    BMI 51.67 kg/m   Physical Exam Vitals and nursing note reviewed.  Constitutional:      General: She is not in acute distress.    Appearance: She is well-developed. She is obese.  HENT:     Head: Normocephalic and atraumatic.  Eyes:     Conjunctiva/sclera: Conjunctivae normal.  Cardiovascular:     Rate and Rhythm: Normal rate and regular rhythm.     Pulses: Normal pulses.  Pulmonary:     Effort: Pulmonary effort is normal. No respiratory distress.     Breath sounds: No stridor.  Abdominal:     General: There is no distension.  Musculoskeletal:       Legs:  Skin:    General: Skin is warm and dry.  Neurological:     Mental Status: She is alert and oriented to person, place, and time.     Cranial Nerves: No cranial nerve deficit.     ED Results / Procedures / Treatments   Labs (all labs ordered are listed, but only abnormal results are displayed) Labs Reviewed - No data to display  EKG None  Radiology DG Ankle Complete Left  Result Date: 11/03/2019 CLINICAL DATA:  01/03/2020, left lower leg pain EXAM: LEFT KNEE - COMPLETE 4+ VIEW; LEFT ANKLE COMPLETE - 3+ VIEW; LEFT FOOT - 2 VIEW COMPARISON:  03/06/2018.  FINDINGS: Left knee: Frontal, bilateral oblique, and cross-table lateral views of the left knee are obtained. No fracture, subluxation, or dislocation. Joint spaces are well preserved. No joint effusion. Left ankle: Frontal, oblique, and lateral views demonstrate stable ORIF of the distal fibula. Well corticated ossific density adjacent to the medial malleolus consistent with sequela of chronic trauma. No acute displaced fracture, subluxation, or dislocation. Joint spaces are well preserved. Left foot:  Frontal and lateral views demonstrate an oblique extra-articular fracture at the distal margin of the fifth proximal phalanx. No other acute bony abnormalities. Mild diffuse soft tissue edema. IMPRESSION: 1. Extra-articular fracture of the fifth proximal phalanx left foot. 2. Stable postsurgical changes left ankle. 3. Unremarkable left knee. Electronically Signed   By: Sharlet Salina M.D.   On: 11/03/2019 18:30   DG Knee Complete 4 Views Left  Result Date: 11/03/2019 CLINICAL DATA:  Larey Seat, left lower leg pain EXAM: LEFT KNEE - COMPLETE 4+ VIEW; LEFT ANKLE COMPLETE - 3+ VIEW; LEFT FOOT - 2 VIEW COMPARISON:  03/06/2018. FINDINGS: Left knee: Frontal, bilateral oblique, and cross-table lateral views of the left knee are obtained. No fracture, subluxation, or dislocation. Joint spaces are well preserved. No joint effusion. Left ankle: Frontal, oblique, and lateral views demonstrate stable ORIF of the distal fibula. Well corticated ossific density adjacent to the medial malleolus consistent with sequela of chronic trauma. No acute displaced fracture, subluxation, or dislocation. Joint spaces are well preserved. Left foot: Frontal and lateral views demonstrate an oblique extra-articular fracture at the distal margin of the fifth proximal phalanx. No other acute bony abnormalities. Mild diffuse soft tissue edema. IMPRESSION: 1. Extra-articular fracture of the fifth proximal phalanx left foot. 2. Stable postsurgical changes  left ankle. 3. Unremarkable left knee. Electronically Signed   By: Sharlet Salina M.D.   On: 11/03/2019 18:30   DG Foot 2 Views Left  Result Date: 11/03/2019 CLINICAL DATA:  Larey Seat, left lower leg pain EXAM: LEFT KNEE - COMPLETE 4+ VIEW; LEFT ANKLE COMPLETE - 3+ VIEW; LEFT FOOT - 2 VIEW COMPARISON:  03/06/2018. FINDINGS: Left knee: Frontal, bilateral oblique, and cross-table lateral views of the left knee are obtained. No fracture, subluxation, or dislocation. Joint spaces are well preserved. No joint effusion. Left ankle: Frontal, oblique, and lateral views demonstrate stable ORIF of the distal fibula. Well corticated ossific density adjacent to the medial malleolus consistent with sequela of chronic trauma. No acute displaced fracture, subluxation, or dislocation. Joint spaces are well preserved. Left foot: Frontal and lateral views demonstrate an oblique extra-articular fracture at the distal margin of the fifth proximal phalanx. No other acute bony abnormalities. Mild diffuse soft tissue edema. IMPRESSION: 1. Extra-articular fracture of the fifth proximal phalanx left foot. 2. Stable postsurgical changes left ankle. 3. Unremarkable left knee. Electronically Signed   By: Sharlet Salina M.D.   On: 11/03/2019 18:30    Procedures Procedures (including critical care time)  Medications Ordered in ED Medications  ketorolac (TORADOL) 30 MG/ML injection 30 mg (has no administration in time range)    ED Course  I have reviewed the triage vital signs and the nursing notes.  Pertinent labs & imaging results that were available during my care of the patient were reviewed by me and considered in my medical decision making (see chart for details).   7:34 PM Patient awake, alert.  I reviewed the x-rays, and demonstrated the images to her and her husband in the room.  I illustrated the fracture of the left fifth phalanx.  On repeat exam, after discussion the possibility for occult knee fracture, patient range  of motion has substantial improved since initial visit evaluation, has no other complaints including proximal pain.  On we discussed implications of demonstration of foot fracture, need for mobilization with postoperative shoe, follow-up with orthopedics. Patient will receive IM Toradol, immobilization as above, but with otherwise reassuring findings, no evidence for distal neurovascular compromise, other fracture, other complaints, is discharged  in stable condition.  MDM Number of Diagnoses or Management Options Acute pain of left knee: new, needed workup Closed fracture of phalanx of left fifth toe, initial encounter: new, needed workup Fall, initial encounter: new, needed workup   Amount and/or Complexity of Data Reviewed Clinical lab tests: reviewed Tests in the radiology section of CPT: reviewed Tests in the medicine section of CPT: reviewed Obtain history from someone other than the patient: yes Independent visualization of images, tracings, or specimens: yes  Risk of Complications, Morbidity, and/or Mortality Presenting problems: high Diagnostic procedures: high Management options: high   Final Clinical Impression(s) / ED Diagnoses Final diagnoses:  Fall, initial encounter  Closed fracture of phalanx of left fifth toe, initial encounter  Acute pain of left knee     Gerhard Munch, MD 11/03/19 [phone]

## 2019-11-03 NOTE — Discharge Instructions (Addendum)
As discussed, it is important to use Tylenol, up to 650 mg 3 times daily, and ibuprofen, 400 mg, 3 times daily for pain control.  Please attempt rest, and use ice packs for additional relief. Please be sure to schedule follow-up with your orthopedic physician.  Return here for concerning changes in your condition.

## 2019-11-09 ENCOUNTER — Encounter: Payer: Self-pay | Admitting: Orthopaedic Surgery

## 2019-11-09 ENCOUNTER — Ambulatory Visit (INDEPENDENT_AMBULATORY_CARE_PROVIDER_SITE_OTHER): Payer: Medicare Other | Admitting: Orthopaedic Surgery

## 2019-11-09 VITALS — Ht 66.0 in | Wt 320.1 lb

## 2019-11-09 DIAGNOSIS — M25562 Pain in left knee: Secondary | ICD-10-CM

## 2019-11-09 DIAGNOSIS — S92502A Displaced unspecified fracture of left lesser toe(s), initial encounter for closed fracture: Secondary | ICD-10-CM | POA: Diagnosis not present

## 2019-11-09 NOTE — Progress Notes (Signed)
Office Visit Note   Patient: [name]           Date of Birth: 10/30/1975           MRN: [id number] Visit Date: 11/09/2019              Requested by: Nolene Ebbs, MD [address] [place],  [place] [address] PCP: Nolene Ebbs, MD   Assessment & Plan: Visit Diagnoses:  1. Acute pain of left knee   2. Closed fracture of phalanx of left fifth toe, initial encounter     Plan: Impression is left MCL sprain and left fifth toe fracture.  We put her in a hinged knee brace for support.  Range of motion as tolerated.  Activity as tolerated.  She is good on medications.  Buddy tape for the fifth toe fracture and I have recommended wearing the postop shoe for 4 weeks.  Recheck at that time with two-view x-rays of the left small toe.  Follow-Up Instructions: Return in about 4 weeks (around 12/07/2019).   Orders:  No orders of the defined types were placed in this encounter.  No orders of the defined types were placed in this encounter.     Procedures: No procedures performed   Clinical Data: No additional findings.   Subjective: Chief Complaint  Patient presents with  . Left Ankle - Pain, Follow-up    ED/ follow-up 11/03/2019 (fall)  . Left Foot - Pain, Follow-up  . Left Knee - Pain, Follow-up    [name] is a 44 year old female who is ankle fracture I fixed a few years back who comes in as a ER follow-up for mechanical fall that occurred on 11/03/2019.  She had x-rays to her left foot ankle and knee.  She slipped and fell on water that was on tile.  She broke her fifth toe and she feels like her left knee hurts worse.  Her ankle is actually feeling much better.  She has been weightbearing with a cane.  She has sharp stabbing aching burning pain in the left knee that hurts worse with standing.  She remembers her knee buckling underneath when she fell.  Denies any mechanical symptoms.   Review of Systems  Constitutional: Negative.   HENT: Negative.   Eyes: Negative.     Respiratory: Negative.   Cardiovascular: Negative.   Endocrine: Negative.   Musculoskeletal: Negative.   Neurological: Negative.   Hematological: Negative.   Psychiatric/Behavioral: Negative.   All other systems reviewed and are negative.    Objective: Vital Signs: Ht 5\' 6"  (1.676 m)   Wt (!) 320 lb 1.8 oz (145.2 kg)   BMI 51.67 kg/m   Physical Exam Vitals and nursing note reviewed.  Constitutional:      Appearance: She is well-developed.  Pulmonary:     Effort: Pulmonary effort is normal.  Skin:    General: Skin is warm.     Capillary Refill: Capillary refill takes less than 2 seconds.  Neurological:     Mental Status: She is alert and oriented to person, place, and time.  Psychiatric:        Behavior: Behavior normal.        Thought Content: Thought content normal.        Judgment: Judgment normal.     Ortho Exam Left knee shows a tenderness along the joint line and along the MCL.  Collaterals cruciates are stable.  No obvious signs of effusion.  Painful range of motion. Left fifth toe shows  moderate swelling.  No neurovascular compromise. Specialty Comments:  No specialty comments available.  Imaging: No results found.   PMFS History: Patient Active Problem List   Diagnosis Date Noted  . Acute pain of left knee 11/09/2019  . Closed fracture of fifth toe of left foot 11/09/2019  . Displaced bimalleolar fracture of left lower leg, subsequent encounter for closed fracture with delayed healing   . Closed high lateral malleolus fracture, left, initial encounter   . Anemia, unspecified 02/15/2013  . Pes anserine bursitis 01/20/2011  . Edema 08/21/2010  . Nonallopathic lesion of lumbar region 07/04/2010  . Obesity, unspecified 03/27/2010  . DEPRESSION, MAJOR, MILD 03/27/2010  . Chronic pain syndrome 03/27/2010  . NONALLOPATHIC LESION OF THORACIC REGION NEC 03/27/2010  . FATIGUE 03/27/2010  . HEADACHE 03/27/2010   Past Medical History:  Diagnosis Date  .  ADHD   . Anemia   . Anxiety   . Arthralgia   . Bimalleolar ankle fracture 07/19/2016   left  . Bipolar disorder (HCC)   . Chronic pain    hands, knees, back  . Depression   . Migraines   . Obesity     Family History  Problem Relation Age of Onset  . Lupus Sister   . Diabetes type II Unknown   . Stroke Unknown     Past Surgical History:  Procedure Laterality Date  . CERVICAL CONIZATION W/BX  10/30/2011   Procedure: CONIZATION CERVIX WITH BIOPSY;  Surgeon: Kathreen Cosier, MD;  Location: [facility];  Service: Gynecology;  Laterality: N/A;  . DILATION AND CURETTAGE OF UTERUS  10/30/2011   Procedure: DILATATION AND CURETTAGE;  Surgeon: Kathreen Cosier, MD;  Location: [facility];  Service: Gynecology;  Laterality: N/A;  . DILITATION & CURRETTAGE/HYSTROSCOPY WITH HYDROTHERMAL ABLATION N/A 12/14/2013   Procedure: DILATATION & CURETTAGE/HYSTEROSCOPY WITH HYDROTHERMAL ABLATION;  Surgeon: Kathreen Cosier, MD;  Location: [facility];  Service: Gynecology;  Laterality: N/A;  . ORIF ANKLE FRACTURE Left 07/23/2016   Procedure: OPEN REDUCTION INTERNAL FIXATION (ORIF) LEFT BIMALLEOLAR ANKLE FRACTURE;  Surgeon: Tarry Kos, MD;  Location: [facility];  Service: Orthopedics;  Laterality: Left;  . TUBAL LIGATION  07/12/2003  . WISDOM TOOTH EXTRACTION     Social History   Occupational History  . Not on file  Tobacco Use  . Smoking status: Never Smoker  . Smokeless tobacco: Never Used  Substance and Sexual Activity  . Alcohol use: No  . Drug use: No  . Sexual activity: Yes    Partners: Male    Birth control/protection: Surgical

## 2019-12-09 ENCOUNTER — Ambulatory Visit: Payer: Self-pay

## 2019-12-09 ENCOUNTER — Ambulatory Visit (INDEPENDENT_AMBULATORY_CARE_PROVIDER_SITE_OTHER): Payer: Medicare Other | Admitting: Orthopaedic Surgery

## 2019-12-09 ENCOUNTER — Encounter: Payer: Self-pay | Admitting: Orthopaedic Surgery

## 2019-12-09 DIAGNOSIS — M25561 Pain in right knee: Secondary | ICD-10-CM

## 2019-12-09 DIAGNOSIS — G8929 Other chronic pain: Secondary | ICD-10-CM | POA: Diagnosis not present

## 2019-12-09 DIAGNOSIS — S92502A Displaced unspecified fracture of left lesser toe(s), initial encounter for closed fracture: Secondary | ICD-10-CM

## 2019-12-09 NOTE — Progress Notes (Signed)
Office Visit Note   Patient: [name]           Date of Birth: November 12, 1975           MRN: [id number] Visit Date: 12/09/2019              Requested by: Fleet Contras, MD [address] [place],  [place] [address] PCP: Fleet Contras, MD   Assessment & Plan: Visit Diagnoses:  1. Closed fracture of phalanx of left fifth toe, initial encounter   2. Chronic pain of right knee     Plan: Impression is left small toe fracture and continued left knee pain following a slipping injury on a wet floor.  In regards to the small toe, I have reinforced the need to continue to wear a stiff soled shoe to allow for continued healing.  She is unsure of where her postop shoe is and notes that she does not have any stiff shoes at home.  Will provide her with another postop shoe today.  She will continue to buddy tape as needed.  She will follow-up with [place] in 4 weeks time for repeat evaluation and 2 view x-rays of the left small toe.  In regards to the left knee, we will go ahead and get an MRI to further assess for structural abnormalities.  She will follow-up with [place] once that has been completed.  Follow-Up Instructions: Return in about 4 weeks (around 01/06/2020).   Orders:  Orders Placed This Encounter  Procedures  . XR Foot 2 Views Left   No orders of the defined types were placed in this encounter.     Procedures: No procedures performed   Clinical Data: No additional findings.   Subjective: Chief Complaint  Patient presents with  . Left Knee - Pain  . Left 5th Toe - Pain, Follow-up    HPI patient is a pleasant 44 year old who comes in today for follow-up of her left knee and left small toe injury.  These occurred on 11/03/2019.  In regards to the left small toe she did sustain a phalanx fracture.  She was seen in our office 4 weeks ago for this where she was placed in a postop shoe and buddy tape to the fourth and fifth toes.  She notes that she only wore the shoe for about 2  weeks due to it causing pelvic discomfort.  She has continued pain to the toe which does not occur at all times.  In regards to the left knee, it was thought that she had an MCL sprain and was placed in a hinged knee brace.  Due to the size of her thigh, she was unable to wear this longer than 1 week due to her continuing to slide down her leg.  She has continued pain to the medial knee.  Review of Systems as detailed in HPI.  All others reviewed and are negative.   Objective: Vital Signs: There were no vitals taken for this visit.  Physical Exam well-developed and well-nourished female no acute distress.  Alert and oriented x3.  Ortho Exam examination of the left knee reveals moderate tenderness along the medial joint line and MCL.  She is stable to valgus stress and without pain.  Left toe exam shows no swelling and minimal tenderness.  She is neurovascular intact distally.  Specialty Comments:  No specialty comments available.  Imaging: XR Foot 2 Views Left  Result Date: 12/09/2019 X-rays demonstrate stable alignment of the fracture with evidence of callus  formation    PMFS History: Patient Active Problem List   Diagnosis Date Noted  . Acute pain of left knee 11/09/2019  . Closed fracture of fifth toe of left foot 11/09/2019  . Displaced bimalleolar fracture of left lower leg, subsequent encounter for closed fracture with delayed healing   . Closed high lateral malleolus fracture, left, initial encounter   . Anemia, unspecified 02/15/2013  . Pes anserine bursitis 01/20/2011  . Edema 08/21/2010  . Nonallopathic lesion of lumbar region 07/04/2010  . Obesity, unspecified 03/27/2010  . DEPRESSION, MAJOR, MILD 03/27/2010  . Chronic pain syndrome 03/27/2010  . NONALLOPATHIC LESION OF THORACIC REGION NEC 03/27/2010  . FATIGUE 03/27/2010  . HEADACHE 03/27/2010   Past Medical History:  Diagnosis Date  . ADHD   . Anemia   . Anxiety   . Arthralgia   . Bimalleolar ankle fracture  07/19/2016   left  . Bipolar disorder (HCC)   . Chronic pain    hands, knees, back  . Depression   . Migraines   . Obesity     Family History  Problem Relation Age of Onset  . Lupus Sister   . Diabetes type II Unknown   . Stroke Unknown     Past Surgical History:  Procedure Laterality Date  . CERVICAL CONIZATION W/BX  10/30/2011   Procedure: CONIZATION CERVIX WITH BIOPSY;  Surgeon: Kathreen Cosier, MD;  Location: [facility];  Service: Gynecology;  Laterality: N/A;  . DILATION AND CURETTAGE OF UTERUS  10/30/2011   Procedure: DILATATION AND CURETTAGE;  Surgeon: Kathreen Cosier, MD;  Location: [facility];  Service: Gynecology;  Laterality: N/A;  . DILITATION & CURRETTAGE/HYSTROSCOPY WITH HYDROTHERMAL ABLATION N/A 12/14/2013   Procedure: DILATATION & CURETTAGE/HYSTEROSCOPY WITH HYDROTHERMAL ABLATION;  Surgeon: Kathreen Cosier, MD;  Location: [facility];  Service: Gynecology;  Laterality: N/A;  . ORIF ANKLE FRACTURE Left 07/23/2016   Procedure: OPEN REDUCTION INTERNAL FIXATION (ORIF) LEFT BIMALLEOLAR ANKLE FRACTURE;  Surgeon: Tarry Kos, MD;  Location: [facility];  Service: Orthopedics;  Laterality: Left;  . TUBAL LIGATION  07/12/2003  . WISDOM TOOTH EXTRACTION     Social History   Occupational History  . Not on file  Tobacco Use  . Smoking status: Never Smoker  . Smokeless tobacco: Never Used  Substance and Sexual Activity  . Alcohol use: No  . Drug use: No  . Sexual activity: Yes    Partners: Male    Birth control/protection: Surgical

## 2020-01-06 ENCOUNTER — Other Ambulatory Visit: Payer: Self-pay

## 2020-01-06 ENCOUNTER — Ambulatory Visit
Admission: RE | Admit: 2020-01-06 | Discharge: 2020-01-06 | Disposition: A | Discharge status: Home / Self Care | Payer: Medicare Other | Source: Ambulatory Visit | Attending: Orthopaedic Surgery | Admitting: Orthopaedic Surgery

## 2020-01-06 DIAGNOSIS — G8929 Other chronic pain: Secondary | ICD-10-CM

## 2020-01-07 ENCOUNTER — Other Ambulatory Visit: Payer: Medicare Other

## 2020-01-10 ENCOUNTER — Encounter: Payer: Self-pay | Admitting: Orthopaedic Surgery

## 2020-01-10 ENCOUNTER — Ambulatory Visit (INDEPENDENT_AMBULATORY_CARE_PROVIDER_SITE_OTHER): Payer: Medicare Other | Admitting: Orthopaedic Surgery

## 2020-01-10 VITALS — Ht 66.5 in | Wt 317.0 lb

## 2020-01-10 DIAGNOSIS — Z6841 Body Mass Index (BMI) 40.0 and over, adult: Secondary | ICD-10-CM | POA: Diagnosis not present

## 2020-01-10 DIAGNOSIS — M25562 Pain in left knee: Secondary | ICD-10-CM | POA: Diagnosis not present

## 2020-01-10 NOTE — Progress Notes (Signed)
Office Visit Note   Patient: [name]           Date of Birth: March 04, 1976           MRN: [id number] Visit Date: 01/10/2020              Requested by: Fleet Contras, MD [address] [place],  [place] [address] PCP: Fleet Contras, MD   Assessment & Plan: Visit Diagnoses:  1. Acute pain of left knee   2. Body mass index 50.0-59.9, adult (HCC)   3. Morbid obesity (HCC)     Plan: MRI is consistent with a mild MCL strain.  She is not symptomatic from the insufficiency fracture of the lateral tibial plateau.  Fortunately she does not have any chondromalacia or DJD.  Follow-up as needed. The patient meets the AMA guidelines for Morbid (severe) obesity with a BMI > 40.0 and I have recommended weight loss.  Follow-Up Instructions: Return if symptoms worsen or fail to improve.   Orders:  No orders of the defined types were placed in this encounter.  No orders of the defined types were placed in this encounter.     Procedures: No procedures performed   Clinical Data: No additional findings.   Subjective: Chief Complaint  Patient presents with  . Left Knee - Pain    [name] returns today for MRI review.  She still has some medial knee pain.  Denies any pain on the lateral side of the knee.   Review of Systems  Constitutional: Negative.   HENT: Negative.   Eyes: Negative.   Respiratory: Negative.   Cardiovascular: Negative.   Endocrine: Negative.   Musculoskeletal: Negative.   Neurological: Negative.   Hematological: Negative.   Psychiatric/Behavioral: Negative.   All other systems reviewed and are negative.    Objective: Vital Signs: Ht 5' 6.5" (1.689 m)   Wt (!) 317 lb (143.8 kg)   BMI 50.40 kg/m   Physical Exam Vitals and nursing note reviewed.  Constitutional:      Appearance: She is well-developed.  Pulmonary:     Effort: Pulmonary effort is normal.  Skin:    General: Skin is warm.     Capillary Refill: Capillary refill takes less than 2  seconds.  Neurological:     Mental Status: She is alert and oriented to person, place, and time.  Psychiatric:        Behavior: Behavior normal.        Thought Content: Thought content normal.        Judgment: Judgment normal.     Ortho Exam Left knee shows tenderness at the proximal portion of the MCL.  MCL is stable to testing.  Lateral tibial plateau is nontender to palpation. Specialty Comments:  No specialty comments available.  Imaging: No results found.   PMFS History: Patient Active Problem List   Diagnosis Date Noted  . Acute pain of left knee 11/09/2019  . Closed fracture of fifth toe of left foot 11/09/2019  . Displaced bimalleolar fracture of left lower leg, subsequent encounter for closed fracture with delayed healing   . Closed high lateral malleolus fracture, left, initial encounter   . Anemia, unspecified 02/15/2013  . Pes anserine bursitis 01/20/2011  . Edema 08/21/2010  . Nonallopathic lesion of lumbar region 07/04/2010  . Obesity, unspecified 03/27/2010  . DEPRESSION, MAJOR, MILD 03/27/2010  . Chronic pain syndrome 03/27/2010  . NONALLOPATHIC LESION OF THORACIC REGION NEC 03/27/2010  . FATIGUE 03/27/2010  . HEADACHE 03/27/2010  Past Medical History:  Diagnosis Date  . ADHD   . Anemia   . Anxiety   . Arthralgia   . Bimalleolar ankle fracture 07/19/2016   left  . Bipolar disorder (HCC)   . Chronic pain    hands, knees, back  . Depression   . Migraines   . Obesity     Family History  Problem Relation Age of Onset  . Lupus Sister   . Diabetes type II Unknown   . Stroke Unknown     Past Surgical History:  Procedure Laterality Date  . CERVICAL CONIZATION W/BX  10/30/2011   Procedure: CONIZATION CERVIX WITH BIOPSY;  Surgeon: Kathreen Cosier, MD;  Location: [facility];  Service: Gynecology;  Laterality: N/A;  . DILATION AND CURETTAGE OF UTERUS  10/30/2011   Procedure: DILATATION AND CURETTAGE;  Surgeon: Kathreen Cosier, MD;  Location: [facility];   Service: Gynecology;  Laterality: N/A;  . DILITATION & CURRETTAGE/HYSTROSCOPY WITH HYDROTHERMAL ABLATION N/A 12/14/2013   Procedure: DILATATION & CURETTAGE/HYSTEROSCOPY WITH HYDROTHERMAL ABLATION;  Surgeon: Kathreen Cosier, MD;  Location: [facility];  Service: Gynecology;  Laterality: N/A;  . ORIF ANKLE FRACTURE Left 07/23/2016   Procedure: OPEN REDUCTION INTERNAL FIXATION (ORIF) LEFT BIMALLEOLAR ANKLE FRACTURE;  Surgeon: Tarry Kos, MD;  Location: [facility];  Service: Orthopedics;  Laterality: Left;  . TUBAL LIGATION  07/12/2003  . WISDOM TOOTH EXTRACTION     Social History   Occupational History  . Not on file  Tobacco Use  . Smoking status: Never Smoker  . Smokeless tobacco: Never Used  Substance and Sexual Activity  . Alcohol use: No  . Drug use: No  . Sexual activity: Yes    Partners: Male    Birth control/protection: Surgical

## 2020-05-02 ENCOUNTER — Other Ambulatory Visit: Payer: Self-pay

## 2020-05-02 ENCOUNTER — Emergency Department (HOSPITAL_COMMUNITY): Payer: Medicare Other

## 2020-05-02 ENCOUNTER — Emergency Department (HOSPITAL_COMMUNITY)
Admission: EM | Admit: 2020-05-02 | Discharge: 2020-05-02 | Disposition: A | Discharge status: Home / Self Care | Payer: Medicare Other | Attending: Emergency Medicine | Admitting: Emergency Medicine

## 2020-05-02 ENCOUNTER — Encounter (HOSPITAL_COMMUNITY): Payer: Self-pay | Admitting: Emergency Medicine

## 2020-05-02 DIAGNOSIS — Y92410 Unspecified street and highway as the place of occurrence of the external cause: Secondary | ICD-10-CM | POA: Diagnosis not present

## 2020-05-02 DIAGNOSIS — N83202 Unspecified ovarian cyst, left side: Secondary | ICD-10-CM | POA: Diagnosis not present

## 2020-05-02 DIAGNOSIS — R10817 Generalized abdominal tenderness: Secondary | ICD-10-CM | POA: Diagnosis not present

## 2020-05-02 DIAGNOSIS — R519 Headache, unspecified: Secondary | ICD-10-CM | POA: Diagnosis not present

## 2020-05-02 DIAGNOSIS — M542 Cervicalgia: Secondary | ICD-10-CM | POA: Diagnosis not present

## 2020-05-02 DIAGNOSIS — H538 Other visual disturbances: Secondary | ICD-10-CM | POA: Diagnosis not present

## 2020-05-02 DIAGNOSIS — R112 Nausea with vomiting, unspecified: Secondary | ICD-10-CM | POA: Diagnosis not present

## 2020-05-02 DIAGNOSIS — M25511 Pain in right shoulder: Secondary | ICD-10-CM | POA: Diagnosis not present

## 2020-05-02 DIAGNOSIS — S299XXA Unspecified injury of thorax, initial encounter: Secondary | ICD-10-CM | POA: Diagnosis present

## 2020-05-02 DIAGNOSIS — R1030 Lower abdominal pain, unspecified: Secondary | ICD-10-CM | POA: Diagnosis not present

## 2020-05-02 DIAGNOSIS — N83209 Unspecified ovarian cyst, unspecified side: Secondary | ICD-10-CM

## 2020-05-02 LAB — CBC WITH DIFFERENTIAL/PLATELET
Abs Immature Granulocytes: 0.04 10*3/uL (ref 0.00–0.07)
Basophils Absolute: 0 10*3/uL (ref 0.0–0.1)
Basophils Relative: 0 %
Eosinophils Absolute: 0.1 10*3/uL (ref 0.0–0.5)
Eosinophils Relative: 1 %
HCT: 40.5 % (ref 36.0–46.0)
Hemoglobin: 11.8 g/dL — ABNORMAL LOW (ref 12.0–15.0)
Immature Granulocytes: 1 %
Lymphocytes Relative: 28 %
Lymphs Abs: 2.4 10*3/uL (ref 0.7–4.0)
MCH: 20.2 pg — ABNORMAL LOW (ref 26.0–34.0)
MCHC: 29.1 g/dL — ABNORMAL LOW (ref 30.0–36.0)
MCV: 69.3 fL — ABNORMAL LOW (ref 80.0–100.0)
Monocytes Absolute: 0.5 10*3/uL (ref 0.1–1.0)
Monocytes Relative: 6 %
Neutro Abs: 5.3 10*3/uL (ref 1.7–7.7)
Neutrophils Relative %: 64 %
Platelets: 253 10*3/uL (ref 150–400)
RBC: 5.84 MIL/uL — ABNORMAL HIGH (ref 3.87–5.11)
RDW: 17.6 % — ABNORMAL HIGH (ref 11.5–15.5)
WBC: 8.4 10*3/uL (ref 4.0–10.5)
nRBC: 0 % (ref 0.0–0.2)

## 2020-05-02 LAB — COMPREHENSIVE METABOLIC PANEL
ALT: 14 U/L (ref 0–44)
AST: 16 U/L (ref 15–41)
Albumin: 3.1 g/dL — ABNORMAL LOW (ref 3.5–5.0)
Alkaline Phosphatase: 62 U/L (ref 38–126)
Anion gap: 8 (ref 5–15)
BUN: 9 mg/dL (ref 6–20)
CO2: 24 mmol/L (ref 22–32)
Calcium: 8.9 mg/dL (ref 8.9–10.3)
Chloride: 104 mmol/L (ref 98–111)
Creatinine, Ser: 0.69 mg/dL (ref 0.44–1.00)
GFR, Estimated: >60 mL/min (ref >60)
Glucose, Bld: 118 mg/dL — ABNORMAL HIGH (ref 70–99)
Potassium: 3.5 mmol/L (ref 3.5–5.1)
Sodium: 136 mmol/L (ref 135–145)
Total Bilirubin: 0.5 mg/dL (ref 0.3–1.2)
Total Protein: 6.8 g/dL (ref 6.5–8.1)

## 2020-05-02 LAB — I-STAT CHEM 8, ED
BUN: 10 mg/dL (ref 6–20)
Calcium, Ion: 1.11 mmol/L — ABNORMAL LOW (ref 1.15–1.40)
Chloride: 103 mmol/L (ref 98–111)
Creatinine, Ser: 0.6 mg/dL (ref 0.44–1.00)
Glucose, Bld: 117 mg/dL — ABNORMAL HIGH (ref 70–99)
HCT: 40 % (ref 36.0–46.0)
Hemoglobin: 13.6 g/dL (ref 12.0–15.0)
Potassium: 3.5 mmol/L (ref 3.5–5.1)
Sodium: 140 mmol/L (ref 135–145)
TCO2: 26 mmol/L (ref 22–32)

## 2020-05-02 LAB — I-STAT BETA HCG BLOOD, ED (MC, WL, AP ONLY): I-stat hCG, quantitative: <5 m[IU]/mL (ref <5)

## 2020-05-02 MED ORDER — SODIUM CHLORIDE 0.9 % IV BOLUS
1000.0000 mL | Freq: Once | INTRAVENOUS | Status: AC
  Administered 2020-05-02: 1000 mL via INTRAVENOUS

## 2020-05-02 MED ORDER — IOHEXOL 300 MG/ML  SOLN
100.0000 mL | Freq: Once | INTRAMUSCULAR | Status: AC | PRN
  Administered 2020-05-02: 100 mL via INTRAVENOUS

## 2020-05-02 MED ORDER — ONDANSETRON 4 MG PO TBDP
ORAL_TABLET | ORAL | 0 refills | Status: DC
Start: 2020-05-02 — End: 2020-06-19

## 2020-05-02 MED ORDER — ONDANSETRON HCL 4 MG/2ML IJ SOLN
4.0000 mg | Freq: Once | INTRAMUSCULAR | Status: AC
  Administered 2020-05-02: 4 mg via INTRAVENOUS
  Filled 2020-05-02: qty 2

## 2020-05-02 MED ORDER — HYDROMORPHONE HCL 1 MG/ML IJ SOLN
1.0000 mg | Freq: Once | INTRAMUSCULAR | Status: AC
  Administered 2020-05-02: 1 mg via INTRAVENOUS
  Filled 2020-05-02: qty 1

## 2020-05-02 NOTE — ED Notes (Signed)
Patient transported to CT 
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
## 2020-05-02 NOTE — ED Notes (Signed)
istat labs placed at [facility].

## 2020-05-02 NOTE — ED Notes (Signed)
Received pt at this time from lobby per wheelchair. 

## 2020-05-02 NOTE — Discharge Instructions (Addendum)
Your scans showed no bleeding or fractures   There is incidental cyst in your ovary and your spleen that can be followed up with your doctor   Continue taking percocet as prescribed by your doctor   Take zofran for nausea   See your doctor   Return to ER if you have worse abdominal pain, shoulder pain, vomiting'

## 2020-05-02 NOTE — ED Notes (Signed)
Back from CT
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
## 2020-05-28 ENCOUNTER — Encounter: Payer: Self-pay | Admitting: Family Medicine

## 2020-05-28 ENCOUNTER — Other Ambulatory Visit: Payer: Self-pay

## 2020-05-28 ENCOUNTER — Ambulatory Visit: Payer: Medicare Other | Admitting: Family Medicine

## 2020-05-28 ENCOUNTER — Ambulatory Visit (INDEPENDENT_AMBULATORY_CARE_PROVIDER_SITE_OTHER): Payer: Medicare Other | Admitting: Family Medicine

## 2020-05-28 DIAGNOSIS — M5441 Lumbago with sciatica, right side: Secondary | ICD-10-CM | POA: Diagnosis not present

## 2020-05-28 DIAGNOSIS — M542 Cervicalgia: Secondary | ICD-10-CM | POA: Diagnosis not present

## 2020-05-28 DIAGNOSIS — M25511 Pain in right shoulder: Secondary | ICD-10-CM | POA: Diagnosis not present

## 2020-05-28 MED ORDER — BACLOFEN 10 MG PO TABS: 10.0000 mg | ORAL_TABLET | Freq: Three times a day (TID) | ORAL | 3 refills | Status: DC | PRN

## 2020-05-28 MED ORDER — CELECOXIB 200 MG PO CAPS: 200.0000 mg | ORAL_CAPSULE | Freq: Two times a day (BID) | ORAL | 6 refills | Status: DC | PRN
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
## 2020-06-18 ENCOUNTER — Ambulatory Visit: Payer: Medicare Other

## 2020-06-19 ENCOUNTER — Ambulatory Visit: Payer: Medicare Other | Attending: Family Medicine | Admitting: Physical Therapy

## 2020-06-19 ENCOUNTER — Other Ambulatory Visit: Payer: Self-pay

## 2020-06-19 ENCOUNTER — Encounter (HOSPITAL_COMMUNITY): Payer: Self-pay

## 2020-06-19 ENCOUNTER — Ambulatory Visit (INDEPENDENT_AMBULATORY_CARE_PROVIDER_SITE_OTHER): Payer: Medicare Other

## 2020-06-19 ENCOUNTER — Ambulatory Visit (HOSPITAL_COMMUNITY)
Admission: EM | Admit: 2020-06-19 | Discharge: 2020-06-19 | Disposition: A | Discharge status: Home / Self Care | Payer: Medicare Other | Attending: Student | Admitting: Student

## 2020-06-19 DIAGNOSIS — M5441 Lumbago with sciatica, right side: Secondary | ICD-10-CM

## 2020-06-19 DIAGNOSIS — R2689 Other abnormalities of gait and mobility: Secondary | ICD-10-CM | POA: Diagnosis present

## 2020-06-19 DIAGNOSIS — Z9889 Other specified postprocedural states: Secondary | ICD-10-CM | POA: Diagnosis not present

## 2020-06-19 DIAGNOSIS — M5440 Lumbago with sciatica, unspecified side: Secondary | ICD-10-CM

## 2020-06-19 DIAGNOSIS — R600 Localized edema: Secondary | ICD-10-CM | POA: Diagnosis present

## 2020-06-19 DIAGNOSIS — W009XXA Unspecified fall due to ice and snow, initial encounter: Secondary | ICD-10-CM

## 2020-06-19 DIAGNOSIS — R252 Cramp and spasm: Secondary | ICD-10-CM

## 2020-06-19 DIAGNOSIS — M25511 Pain in right shoulder: Secondary | ICD-10-CM

## 2020-06-19 DIAGNOSIS — M25512 Pain in left shoulder: Secondary | ICD-10-CM

## 2020-06-19 DIAGNOSIS — M6281 Muscle weakness (generalized): Secondary | ICD-10-CM

## 2020-06-19 DIAGNOSIS — M25572 Pain in left ankle and joints of left foot: Secondary | ICD-10-CM | POA: Diagnosis not present

## 2020-06-19 DIAGNOSIS — S93402A Sprain of unspecified ligament of left ankle, initial encounter: Secondary | ICD-10-CM

## 2020-06-19 DIAGNOSIS — G8929 Other chronic pain: Secondary | ICD-10-CM

## 2020-06-19 DIAGNOSIS — W19XXXA Unspecified fall, initial encounter: Secondary | ICD-10-CM | POA: Diagnosis not present

## 2020-06-19 DIAGNOSIS — S43402A Unspecified sprain of left shoulder joint, initial encounter: Secondary | ICD-10-CM

## 2020-06-19 MED ORDER — TIZANIDINE HCL 4 MG PO TABS: 4.0000 mg | ORAL_TABLET | Freq: Four times a day (QID) | ORAL | 0 refills | Status: DC | PRN

## 2020-06-19 NOTE — ED Provider Notes (Signed)
[facility]    CSN: [id number] Arrival date & time: 06/19/20  1603      History   Chief Complaint Chief Complaint  Patient presents with  . Fall    Occurred Sunday night    HPI [name] is a 45 y.o. female presenting for fall. History chronic pain hands, knees, back, migraines, obesity, bimalleolar ankle fracture left ankle s/p repair 2018. Walks with cane at baseline. Presenting today for left shoulder and left ankle pain following fall. Also states she has been having right shoulder pain since December, related to overuse; she is followed by PT for this. States she slipped on ice 2 days ago. Since then she has had significant left ankle pain/swelling and left shoulder pian. Able to bear weight with significant pain. L shoulder ROM is limited due to pain. Endorses pain radiating down to elbow, and tingling in l hand. Endorses thoracic back pain that is chronic but currently exacerbated due to fall. She is right handed.  HPI  Past Medical History:  Diagnosis Date  . ADHD   . Anemia   . Anxiety   . Arthralgia   . Bimalleolar ankle fracture 07/19/2016   left  . Bipolar disorder (HCC)   . Chronic pain    hands, knees, back  . Depression   . Migraines   . Obesity     Patient Active Problem List   Diagnosis Date Noted  . Acute pain of left knee 11/09/2019  . Closed fracture of fifth toe of left foot 11/09/2019  . Displaced bimalleolar fracture of left lower leg, subsequent encounter for closed fracture with delayed healing   . Closed high lateral malleolus fracture, left, initial encounter   . Anemia, unspecified 02/15/2013  . Pes anserine bursitis 01/20/2011  . Edema 08/21/2010  . Nonallopathic lesion of lumbar region 07/04/2010  . Obesity, unspecified 03/27/2010  . DEPRESSION, MAJOR, MILD 03/27/2010  . Chronic pain syndrome 03/27/2010  . NONALLOPATHIC LESION OF THORACIC REGION NEC 03/27/2010  . FATIGUE 03/27/2010  . HEADACHE 03/27/2010    Past  Surgical History:  Procedure Laterality Date  . CERVICAL CONIZATION W/BX  10/30/2011   Procedure: CONIZATION CERVIX WITH BIOPSY;  Surgeon: Kathreen Cosier, MD;  Location: [facility];  Service: Gynecology;  Laterality: N/A;  . DILATION AND CURETTAGE OF UTERUS  10/30/2011   Procedure: DILATATION AND CURETTAGE;  Surgeon: Kathreen Cosier, MD;  Location: [facility];  Service: Gynecology;  Laterality: N/A;  . DILITATION & CURRETTAGE/HYSTROSCOPY WITH HYDROTHERMAL ABLATION N/A 12/14/2013   Procedure: DILATATION & CURETTAGE/HYSTEROSCOPY WITH HYDROTHERMAL ABLATION;  Surgeon: Kathreen Cosier, MD;  Location: [facility];  Service: Gynecology;  Laterality: N/A;  . ORIF ANKLE FRACTURE Left 07/23/2016   Procedure: OPEN REDUCTION INTERNAL FIXATION (ORIF) LEFT BIMALLEOLAR ANKLE FRACTURE;  Surgeon: Tarry Kos, MD;  Location: [facility];  Service: Orthopedics;  Laterality: Left;  . TUBAL LIGATION  07/12/2003  . WISDOM TOOTH EXTRACTION      OB History   No obstetric history on file.      Home Medications    Prior to Admission medications   Medication Sig Start Date End Date Taking? Authorizing Provider  baclofen (LIORESAL) 10 MG tablet Take 1-2 tablets (10-20 mg total) by mouth 3 (three) times daily as needed for muscle spasms. 05/28/20  Yes Hilts, Casimiro Needle, MD  citalopram (CELEXA) 40 MG tablet Take 40 mg by mouth at bedtime. 01/06/18  Yes [provider]  Fe Cbn-Fe Gluc-FA-B12-C-DSS (FERRALET 90) 90-1  MG TABS Take 1 tablet by mouth daily. 06/08/16  Yes [provider]  ibuprofen (ADVIL) 800 MG tablet Take 800 mg by mouth 3 (three) times daily. 03/30/20  Yes [provider]  lidocaine (XYLOCAINE) 5 % ointment Apply topically. 03/30/20  Yes [provider]  mirtazapine (REMERON) 30 MG tablet Take 30 mg by mouth daily. 12/24/15  Yes [provider]  Oxycodone HCl 20 MG TABS Take by mouth. 05/08/20  Yes [provider]  celecoxib (CELEBREX) 200 MG capsule  Take 1 capsule (200 mg total) by mouth 2 (two) times daily as needed. 05/28/20   Hilts, Casimiro Needle, MD    Family History Family History  Problem Relation Age of Onset  . Lupus Sister   . Diabetes type II Other   . Stroke Other     Social History Social History   Tobacco Use  . Smoking status: Never Smoker  . Smokeless tobacco: Never Used  Vaping Use  . Vaping Use: Never used  Substance Use Topics  . Alcohol use: No  . Drug use: No     Allergies   Dilaudid [hydromorphone] and Buspar [buspirone]   Review of Systems Review of Systems  Musculoskeletal:       Bilateral shoulder pain L>R, left ankle pain  All other systems reviewed and are negative.    Physical Exam Triage Vital Signs ED Triage Vitals  Enc Vitals Group     BP 06/19/20 1634 114/80     Pulse Rate 06/19/20 1634 90     Resp 06/19/20 1634 19     Temp 06/19/20 1634 98 F (36.7 C)     Temp Source 06/19/20 1634 Oral     SpO2 06/19/20 1634 97 %     Weight --      Height --      Head Circumference --      Peak Flow --      Pain Score 06/19/20 1638 9     Pain Loc --      Pain Edu? --      Excl. in GC? --    No data found.  Updated Vital Signs BP 114/80 (BP Location: Right Arm)   Pulse 90   Temp 98 F (36.7 C) (Oral)   Resp 19   SpO2 97%   Visual Acuity Right Eye Distance:   Left Eye Distance:   Bilateral Distance:    Right Eye Near:   Left Eye Near:    Bilateral Near:     Physical Exam Vitals reviewed.  Constitutional:      Appearance: Normal appearance. She is obese.  HENT:     Head: Normocephalic and atraumatic.  Cardiovascular:     Rate and Rhythm: Normal rate and regular rhythm.     Heart sounds: Normal heart sounds.  Pulmonary:     Effort: Pulmonary effort is normal.     Breath sounds: Normal breath sounds.  Abdominal:     Palpations: Abdomen is soft.     Tenderness: There is no abdominal tenderness.  Musculoskeletal:     Cervical back: Normal. No spasms or tenderness.      Thoracic back: Spasms and tenderness present. No bony tenderness.     Lumbar back: No spasms or tenderness.     Comments: L ankle with surgical scar to lateral aspect.  L proximal foot with significant swelling and tenderness to dorsal aspect. No bony abnormality palpated. Ankle ROM intact but with pain. Toe ROM intact. Neurovascularly intact.  L shoulder exam limited due to pain. ROM limited due to pain. Significant tenderness to palpation anterior and posteriorly.   R shoulder exam limited due to pain. ROM limited due to pain. tenderness to palpationposteriorly and along scapula.   Neurological:     General: No focal deficit present.     Mental Status: She is alert and oriented to person, place, and time.  Psychiatric:        Mood and Affect: Mood normal.        Behavior: Behavior normal.        Thought Content: Thought content normal.        Judgment: Judgment normal.      UC Treatments / Results  Labs (all labs ordered are listed, but only abnormal results are displayed) Labs Reviewed - No data to display  EKG   Radiology DG Shoulder Right  Result Date: 06/19/2020 CLINICAL DATA:  Slipped and fell, right shoulder pain EXAM: RIGHT SHOULDER - 2+ VIEW COMPARISON:  03/06/2018 FINDINGS: Frontal, transscapular, and axillary views of the right shoulder demonstrate no acute fracture, subluxation, or dislocation. Mild glenohumeral joint osteoarthritis. Right chest is clear. IMPRESSION: 1. Mild glenohumeral osteoarthritis.  No acute fracture. Electronically Signed   By: Sharlet SalinaMichael  Brown M.D.   On: 06/19/2020 17:51   DG Shoulder Left  Result Date: 06/19/2020 CLINICAL DATA:  Slipped and fell, left shoulder pain EXAM: LEFT SHOULDER - 2+ VIEW COMPARISON:  08/14/2006 FINDINGS: Frontal, transscapular, and axillary views of the left shoulder are obtained. No fracture, subluxation, or dislocation. Joint spaces are well preserved. Left chest is clear. IMPRESSION: 1. Unremarkable left shoulder.  Electronically Signed   By: Sharlet SalinaMichael  Brown M.D.   On: 06/19/2020 17:50   DG Foot Complete Left  Result Date: 06/19/2020 CLINICAL DATA:  Left foot and ankle pain, swelling, slipped and fell EXAM: LEFT FOOT - COMPLETE 3+ VIEW COMPARISON:  12/09/2019 FINDINGS: Frontal, oblique, and lateral views of the left foot are obtained. No acute displaced fracture, subluxation, or dislocation. Mild osteoarthritis of the midfoot and hindfoot. Stable postsurgical changes of the distal fibula. Mild anterior soft tissue swelling of the ankle and midfoot. IMPRESSION: 1. Soft tissue swelling.  No acute bony abnormality. 2. Osteoarthritis. Electronically Signed   By: Sharlet SalinaMichael  Brown M.D.   On: 06/19/2020 17:49    Procedures Procedures (including critical care time)  Medications Ordered in UC Medications - No data to display  Initial Impression / Assessment and Plan / UC Course  I have reviewed the triage vital signs and the nursing notes.  Pertinent labs & imaging results that were available during my care of the patient were reviewed by me and considered in my medical decision making (see chart for details).     Xrays today:  R shoulder 1. Mild glenohumeral osteoarthritis.  No acute fracture. L shoulder: 1. Unremarkable left shoulder. L ankle: 1. Soft tissue swelling.  No acute bony abnormality. 2. Osteoarthritis.  For ankle sprain, ASO brace applied. For L shoulder sprain, sling applied. Given history of L bimalleolear ankle fracture with surgical repair, she will f/u with ortho in 1-2 days.  Continue to follow with PT for right shoulder pain.   Pt already taking oxycodone and baclofen for chronic pain; continue this.   Final Clinical Impressions(s) / UC Diagnoses   Final diagnoses:  History of ankle surgery  Fall due to slipping on ice or snow, initial encounter  Sprain of left ankle, unspecified ligament, initial encounter  Sprain of left shoulder, unspecified shoulder  sprain type, initial  encounter  Other chronic pain     Discharge Instructions     -Continue to take oxycodone as directed for chronic pain. You can also take tylenol and ibuprofen (with food) for additional relief. Try ice/heat. Leave your brace and sling on as much as possible until you see the orthopedic doctor.  -Call orthopedist tomorrow and schedule a follow-up with them.    ED Prescriptions    Medication Sig Dispense Auth. Provider   tiZANidine (ZANAFLEX) 4 MG tablet  (Status: Discontinued) Take 1 tablet (4 mg total) by mouth every 6 (six) hours as needed for muscle spasms. 30 tablet Rhys Martini, PA-C     PDMP not reviewed this encounter.   Rhys Martini, PA-C 06/19/20 1818

## 2020-06-19 NOTE — Patient Instructions (Signed)
Heat Therapy Heat therapy can help make painful, stiff muscles and joints feel better. Do not use heat on new injuries. Wait at least 48 hours after an injury to use heat. Do not use heat when you have aches or pains right after an activity. If you still have pain 3 hours after stopping the activity, then you may use heat. HOME CARE Wet heat pack  Soak a clean towel in warm water. Squeeze out the extra water.  Put the warm, wet towel in a plastic bag.  Place a thin, dry towel between your skin and the bag.  Put the heat pack on the area for 5 minutes, and check your skin. Your skin may be pink, but it should not be red.  Leave the heat pack on the area for 15 to 30 minutes.  Repeat this every 2 to 4 hours while awake. Do not use heat while you are sleeping. Warm water bath  Fill a tub with warm water.  Place the affected body part in the tub.  Soak the area for 20 to 40 minutes.  Repeat as needed. Hot water bottle  Fill the water bottle half full with hot water.  Press out the extra air. Close the cap tightly.  Place a dry towel between your skin and the bottle.heat  Put the bottle on the area for 5 minutes, and check your skin. Your skin may be pink, but it should not be red.  Leave the bottle on the area for 15 to 30 minutes.  Repeat this every 2 to 4 hours while awake. Electric heating pad  Place a dry towel between your skin and the heating pad.  Set the heating pad on low heat.  Put the heating pad on the area for 10 minutes, and check your skin. Your skin may be pink, but it should not be red.  Leave the heating pad on the area for 20 to 40 minutes.  Repeat this every 2 to 4 hours while awake.  Do not lie on the heating pad.  Do not fall asleep while using the heating pad.  Do not use the heating pad near water. GET HELP RIGHT AWAY IF:  You get blisters or red skin.  Your skin is puffy (swollen), or you lose feeling (numbness) in the affected  area.  You have any new problems.  Your problems are getting worse.  You have any questions or concerns. If you have any problems, stop using heat therapy until you see your doctor. MAKE SURE YOU:  Understand these instructions.  Will watch your condition.  Will get help right away if you are not doing well or get worse. Document Released: 08/04/2011 Document Reviewed: 07/05/2013 [facility] Patient Information 2015 [place], [place]. This information is not intended to replace advice given to you by your health care provider. Make sure you discuss any questions you have with your health care provider.   Cryotherapy Cryotherapy means treatment with cold. Ice or gel packs can be used to reduce both pain and swelling. Ice is the most helpful within the first 24 to 48 hours after an injury or flare-up from overusing a muscle or joint. Sprains, strains, spasms, burning pain, shooting pain, and aches can all be eased with ice. Ice can also be used when recovering from surgery. Ice is effective, has very few side effects, and is safe for most people to use. PRECAUTIONS  Ice is not a safe treatment option for people with:  Raynaud  phenomenon. This is a condition affecting small blood vessels in the extremities. Exposure to cold may cause your problems to return.  Cold hypersensitivity. There are many forms of cold hypersensitivity, including:  Cold urticaria. Red, itchy hives appear on the skin when the tissues begin to warm after being iced.  Cold erythema. This is a red, itchy rash caused by exposure to cold.  Cold hemoglobinuria. Red blood cells break down when the tissues begin to warm after being iced. The hemoglobin that carry oxygen are passed into the urine because they cannot combine with blood proteins fast enough.  Numbness or altered sensitivity in the area being iced. If you have any of the following conditions, do not use ice until you have discussed cryotherapy with your  caregiver:  Heart conditions, such as arrhythmia, angina, or chronic heart disease.  High blood pressure.  Healing wounds or open skin in the area being iced.  Current infections.  Rheumatoid arthritis.  Poor circulation.  Diabetes. Ice slows the blood flow in the region it is applied. This is beneficial when trying to stop inflamed tissues from spreading irritating chemicals to surrounding tissues. However, if you expose your skin to cold temperatures for too long or without the proper protection, you can damage your skin or nerves. Watch for signs of skin damage due to cold. HOME CARE INSTRUCTIONS Follow these tips to use ice and cold packs safely.  Place a dry or damp towel between the ice and skin. A damp towel will cool the skin more quickly, so you may need to shorten the time that the ice is used.  For a more rapid response, add gentle compression to the ice.  Ice for no more than 10 to 20 minutes at a time. The bonier the area you are icing, the less time it will take to get the benefits of ice.  Check your skin after 5 minutes to make sure there are no signs of a poor response to cold or skin damage.  Rest 20 minutes or more between uses.  Once your skin is numb, you can end your treatment. You can test numbness by very lightly touching your skin. The touch should be so light that you do not see the skin dimple from the pressure of your fingertip. When using ice, most people will feel these normal sensations in this order: cold, burning, aching, and numbness.  Do not use ice on someone who cannot communicate their responses to pain, such as small children or people with dementia. HOW TO MAKE AN ICE PACK Ice packs are the most common way to use ice therapy. Other methods include ice massage, ice baths, and cryosprays. Muscle creams that cause a cold, tingly feeling do not offer the same benefits that ice offers and should not be used as a substitute unless recommended by your  caregiver. To make an ice pack, do one of the following:  Place crushed ice or a bag of frozen vegetables in a sealable plastic bag. Squeeze out the excess air. Place this bag inside another plastic bag. Slide the bag into a pillowcase or place a damp towel between your skin and the bag.  Mix 3 parts water with 1 part rubbing alcohol. Freeze the mixture in a sealable plastic bag. When you remove the mixture from the freezer, it will be slushy. Squeeze out the excess air. Place this bag inside another plastic bag. Slide the bag into a pillowcase or place a damp towel between your skin and  the bag. SEEK MEDICAL CARE IF:  You develop white spots on your skin. This may give the skin a blotchy (mottled) appearance.  Your skin turns blue or pale.  Your skin becomes waxy or hard.  Your swelling gets worse. MAKE SURE YOU:   Understand these instructions.  Will watch your condition.  Will get help right away if you are not doing well or get worse. Document Released: 01/06/2011 Document Revised: 09/26/2013 Document Reviewed: 01/06/2011 [facility] Patient Information 2015 [place], [place]. This information is not intended to replace advice given to you by your health care provider. Make sure you discuss any questions you have with your health care provider.  Garen Lah, PT, ATRIC Certified Exercise Expert for the Aging Adult  06/19/20 3:42 PM Phone: [phone] Fax: [phone]

## 2020-06-19 NOTE — Discharge Instructions (Addendum)
-  Continue to take oxycodone as directed for chronic pain. You can also take tylenol and ibuprofen (with food) for additional relief. Try ice/heat. Leave your brace and sling on as much as possible until you see the orthopedic doctor.  -Call orthopedist tomorrow and schedule a follow-up with them.

## 2020-06-19 NOTE — ED Triage Notes (Signed)
Patient states she was entering her friends house on Sunday and when she steeped up to go in she slipped on ice injuring her left shoulder and ankle. Pt is aox4 and ambulatory.
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
## 2020-06-25 ENCOUNTER — Ambulatory Visit
Admission: RE | Admit: 2020-06-25 | Discharge: 2020-06-25 | Disposition: A | Discharge status: Home / Self Care | Payer: Medicare Other | Source: Ambulatory Visit | Attending: Family Medicine | Admitting: Family Medicine

## 2020-06-25 ENCOUNTER — Other Ambulatory Visit: Payer: Self-pay

## 2020-06-25 ENCOUNTER — Encounter: Payer: Self-pay | Admitting: Family Medicine

## 2020-06-25 ENCOUNTER — Other Ambulatory Visit: Payer: Self-pay | Admitting: Family Medicine

## 2020-06-25 ENCOUNTER — Ambulatory Visit (INDEPENDENT_AMBULATORY_CARE_PROVIDER_SITE_OTHER): Payer: Medicare Other | Admitting: Family Medicine

## 2020-06-25 DIAGNOSIS — M25512 Pain in left shoulder: Secondary | ICD-10-CM | POA: Diagnosis not present

## 2020-06-25 DIAGNOSIS — M25572 Pain in left ankle and joints of left foot: Secondary | ICD-10-CM | POA: Diagnosis not present

## 2020-06-25 DIAGNOSIS — S92902A Unspecified fracture of left foot, initial encounter for closed fracture: Secondary | ICD-10-CM | POA: Diagnosis not present

## 2020-06-25 NOTE — Progress Notes (Signed)
Office Visit Note   Patient: [name]           Date of Birth: Feb 16, 1976           MRN: [id number] Visit Date: 06/25/2020 Requested by: Fleet Contras, MD [address] [place],  [place] [address] PCP: Fleet Contras, MD  Subjective: Chief Complaint  Patient presents with  . Left Ankle - Pain    Fell on 06/17/20. Went to urgent care on 06/19/20. Was given ASO brace. H/o surgery on that ankle x 2. Swelling in the ankle.  . Left Shoulder - Pain    Fell on left arm on 06/17/20.   Marland Kitchen Left Foot - Pain    Pain on the top of the foot. The ankle does not hurt. The pain is in the foot.    HPI: She is here for left foot pain.  She is also having left shoulder pain.  On January 23 she was going to a friend's house, slipped on ice and fell.  Immediate severe pain in the foot and also moderate pain in her left shoulder.  2 days later she was still hurting so she went to the hospital urgent care where x-rays were obtained and were negative for acute fracture.  I reviewed those films as well and the left foot x-rays are concerning for widening of the Lisfranc joint.  She has continued to have quite a bit of pain especially in the foot.  Difficult to bear full weight.  She is status post ankle fracture ORIF in 2018.                ROS:  All other systems were reviewed and are negative.  Objective: Vital Signs: There were no vitals taken for this visit.  Physical Exam:  General:  Alert and oriented, in no acute distress. Pulm:  Breathing unlabored. Psy:  Normal mood, congruent affect. Skin: No bruising Left foot: She has substantial tenderness to palpation at the proximal first and second metatarsals.  She has pain with passive manipulation of the midfoot. Left shoulder: She still has full range of motion but pain at the extremes.    Imaging: No results found.  Assessment & Plan: 1.  8 days status post fall with persistent left foot pain, concerning for Lisfranc  fracture/dislocation. -MRI to further evaluate.  Fracture boot for comfort.  Referral to Dr. Lajoyce Corners if Lisfranc fracture confirmed.  2.  Left shoulder pain status post fall -Proceed with physical therapy.     Procedures: No procedures performed        PMFS History: Patient Active Problem List   Diagnosis Date Noted  . Acute pain of left knee 11/09/2019  . Closed fracture of fifth toe of left foot 11/09/2019  . Displaced bimalleolar fracture of left lower leg, subsequent encounter for closed fracture with delayed healing   . Closed high lateral malleolus fracture, left, initial encounter   . Anemia, unspecified 02/15/2013  . Depression 12/09/2012  . Iron deficiency anemia 08/18/2011  . Vitamin D deficiency 08/18/2011  . Anxiety 06/02/2011  . Elevated blood pressure reading without diagnosis of hypertension 06/02/2011  . Joint pain 06/02/2011  . Migraine headache 06/02/2011  . Pes anserine bursitis 01/20/2011  . Edema 08/21/2010  . Nonallopathic lesion of lumbar region 07/04/2010  . Obesity, unspecified 03/27/2010  . DEPRESSION, MAJOR, MILD 03/27/2010  . Chronic pain syndrome 03/27/2010  . NONALLOPATHIC LESION OF THORACIC REGION NEC 03/27/2010  . FATIGUE 03/27/2010  . HEADACHE 03/27/2010  Past Medical History:  Diagnosis Date  . ADHD   . Anemia   . Anxiety   . Arthralgia   . Bimalleolar ankle fracture 07/19/2016   left  . Bipolar disorder (HCC)   . Chronic pain    hands, knees, back  . Depression   . Migraines   . Obesity     Family History  Problem Relation Age of Onset  . Lupus Sister   . Diabetes type II Other   . Stroke Other     Past Surgical History:  Procedure Laterality Date  . CERVICAL CONIZATION W/BX  10/30/2011   Procedure: CONIZATION CERVIX WITH BIOPSY;  Surgeon: Kathreen Cosier, MD;  Location: [facility];  Service: Gynecology;  Laterality: N/A;  . DILATION AND CURETTAGE OF UTERUS  10/30/2011   Procedure: DILATATION AND CURETTAGE;  Surgeon:  Kathreen Cosier, MD;  Location: [facility];  Service: Gynecology;  Laterality: N/A;  . DILITATION & CURRETTAGE/HYSTROSCOPY WITH HYDROTHERMAL ABLATION N/A 12/14/2013   Procedure: DILATATION & CURETTAGE/HYSTEROSCOPY WITH HYDROTHERMAL ABLATION;  Surgeon: Kathreen Cosier, MD;  Location: [facility];  Service: Gynecology;  Laterality: N/A;  . ORIF ANKLE FRACTURE Left 07/23/2016   Procedure: OPEN REDUCTION INTERNAL FIXATION (ORIF) LEFT BIMALLEOLAR ANKLE FRACTURE;  Surgeon: Tarry Kos, MD;  Location: [facility];  Service: Orthopedics;  Laterality: Left;  . TUBAL LIGATION  07/12/2003  . WISDOM TOOTH EXTRACTION     Social History   Occupational History  . Not on file  Tobacco Use  . Smoking status: Never Smoker  . Smokeless tobacco: Never Used  Vaping Use  . Vaping Use: Never used  Substance and Sexual Activity  . Alcohol use: No  . Drug use: No  . Sexual activity: Yes    Partners: Male    Birth control/protection: Surgical

## 2020-06-26 ENCOUNTER — Ambulatory Visit: Payer: Medicare Other | Admitting: Family Medicine

## 2020-07-17 ENCOUNTER — Ambulatory Visit
Admission: RE | Admit: 2020-07-17 | Discharge: 2020-07-17 | Disposition: A | Discharge status: Home / Self Care | Payer: Medicare Other | Source: Ambulatory Visit | Attending: Family Medicine | Admitting: Family Medicine

## 2020-07-17 ENCOUNTER — Other Ambulatory Visit: Payer: Self-pay

## 2020-07-17 DIAGNOSIS — S92902A Unspecified fracture of left foot, initial encounter for closed fracture: Secondary | ICD-10-CM

## 2020-07-18 ENCOUNTER — Telehealth: Payer: Self-pay | Admitting: Family Medicine

## 2020-07-18 DIAGNOSIS — M25512 Pain in left shoulder: Secondary | ICD-10-CM

## 2020-07-18 DIAGNOSIS — M25572 Pain in left ankle and joints of left foot: Secondary | ICD-10-CM

## 2020-07-18 NOTE — Telephone Encounter (Signed)
I called and advised the patient of her results. [name] does still wear the cam boot some, as [name] says the foot does swell at times. Per Dr. Prince Rome' ov note, [name] can wear this for comfort as needed. Advised her to let us know if [name] does not improve with PT.

## 2020-07-18 NOTE — Telephone Encounter (Signed)
MRI looks good, no ligament damage.  No indication for surgery.    Will ask physical therapy to work on the foot in addition to the shoulder.

## 2020-08-06 ENCOUNTER — Other Ambulatory Visit: Payer: Self-pay | Admitting: Internal Medicine

## 2020-08-09 ENCOUNTER — Ambulatory Visit: Payer: Medicare Other | Attending: Family Medicine

## 2020-08-09 ENCOUNTER — Other Ambulatory Visit: Payer: Self-pay

## 2020-08-09 DIAGNOSIS — M5440 Lumbago with sciatica, unspecified side: Secondary | ICD-10-CM | POA: Diagnosis present

## 2020-08-09 DIAGNOSIS — M25572 Pain in left ankle and joints of left foot: Secondary | ICD-10-CM | POA: Insufficient documentation

## 2020-08-09 DIAGNOSIS — M25511 Pain in right shoulder: Secondary | ICD-10-CM | POA: Diagnosis present

## 2020-08-09 DIAGNOSIS — R2689 Other abnormalities of gait and mobility: Secondary | ICD-10-CM | POA: Insufficient documentation

## 2020-08-09 DIAGNOSIS — R252 Cramp and spasm: Secondary | ICD-10-CM | POA: Insufficient documentation

## 2020-08-09 DIAGNOSIS — M5441 Lumbago with sciatica, right side: Secondary | ICD-10-CM | POA: Diagnosis not present

## 2020-08-09 DIAGNOSIS — M25512 Pain in left shoulder: Secondary | ICD-10-CM | POA: Diagnosis present

## 2020-08-09 DIAGNOSIS — M6281 Muscle weakness (generalized): Secondary | ICD-10-CM | POA: Diagnosis present

## 2020-08-09 DIAGNOSIS — R600 Localized edema: Secondary | ICD-10-CM | POA: Diagnosis present
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
## 2020-08-14 ENCOUNTER — Ambulatory Visit: Payer: Medicare Other | Admitting: Physical Therapy

## 2020-08-14 ENCOUNTER — Other Ambulatory Visit: Payer: Self-pay

## 2020-08-14 ENCOUNTER — Encounter: Payer: Self-pay | Admitting: Physical Therapy

## 2020-08-14 DIAGNOSIS — M25511 Pain in right shoulder: Secondary | ICD-10-CM

## 2020-08-14 DIAGNOSIS — M5441 Lumbago with sciatica, right side: Secondary | ICD-10-CM | POA: Diagnosis not present

## 2020-08-14 DIAGNOSIS — R2689 Other abnormalities of gait and mobility: Secondary | ICD-10-CM

## 2020-08-14 DIAGNOSIS — M5440 Lumbago with sciatica, unspecified side: Secondary | ICD-10-CM

## 2020-08-14 DIAGNOSIS — M6281 Muscle weakness (generalized): Secondary | ICD-10-CM

## 2020-08-14 DIAGNOSIS — M25572 Pain in left ankle and joints of left foot: Secondary | ICD-10-CM

## 2020-08-14 DIAGNOSIS — R252 Cramp and spasm: Secondary | ICD-10-CM

## 2020-08-14 NOTE — Therapy (Signed)
[facility] [address] [place], [place], [address] Phone: [phone]   Fax:  [phone]  Physical Therapy Treatment  Patient Details  Name: [name] MRN: [id number] Date of Birth: Mar 18, 1976 Referring Provider (PT): Lavada Mesi MD   Encounter Date: 08/14/2020   PT End of Session - 08/14/20 1032    Visit Number 3    Number of Visits 18    Date for PT Re-Evaluation 10/06/20    Authorization Type UHC MCR/MCD    PT Start Time [phone]    PT Stop Time 1002    PT Time Calculation (min) 46 min    Activity Tolerance Patient limited by pain    Behavior During Therapy [facility] for tasks assessed/performed           Past Medical History:  Diagnosis Date  . ADHD   . Anemia   . Anxiety   . Arthralgia   . Bimalleolar ankle fracture 07/19/2016   left  . Bipolar disorder (HCC)   . Chronic pain    hands, knees, back  . Depression   . Migraines   . Obesity     Past Surgical History:  Procedure Laterality Date  . CERVICAL CONIZATION W/BX  10/30/2011   Procedure: CONIZATION CERVIX WITH BIOPSY;  Surgeon: Kathreen Cosier, MD;  Location: [facility];  Service: Gynecology;  Laterality: N/A;  . DILATION AND CURETTAGE OF UTERUS  10/30/2011   Procedure: DILATATION AND CURETTAGE;  Surgeon: Kathreen Cosier, MD;  Location: [facility];  Service: Gynecology;  Laterality: N/A;  . DILITATION & CURRETTAGE/HYSTROSCOPY WITH HYDROTHERMAL ABLATION N/A 12/14/2013   Procedure: DILATATION & CURETTAGE/HYSTEROSCOPY WITH HYDROTHERMAL ABLATION;  Surgeon: Kathreen Cosier, MD;  Location: [facility];  Service: Gynecology;  Laterality: N/A;  . ORIF ANKLE FRACTURE Left 07/23/2016   Procedure: OPEN REDUCTION INTERNAL FIXATION (ORIF) LEFT BIMALLEOLAR ANKLE FRACTURE;  Surgeon: Tarry Kos, MD;  Location: [facility];  Service: Orthopedics;  Laterality: Left;  . TUBAL LIGATION  07/12/2003  . WISDOM TOOTH EXTRACTION      There were no vitals filed for  this visit.   Subjective Assessment - 08/14/20 0922    Subjective They need to do something.  Sharp pains in shoulder blade around my neck area. Back hurts in my R side, hip.  No time to take pain medicine this AM.  Can get down to 5/10 with medicine.    Currently in Pain? Yes    Pain Score 7     Pain Location Back    Pain Orientation Right;Lower    Pain Descriptors / Indicators Aching    Pain Type Chronic pain    Pain Radiating Towards R hip    Pain Onset More than a month ago    Pain Frequency Constant    Pain Score 7    Pain Location Shoulder    Pain Orientation Right    Pain Descriptors / Indicators Sore;Spasm    Pain Type Chronic pain    Pain Onset More than a month ago                [facility] Adult PT Treatment/Exercise - 08/14/20 0001      Lumbar Exercises: Standing   Row Strengthening;Both;10 reps;Theraband    Theraband Level (Row) Level 3 (Green)    Row Limitations 2 sets    Shoulder Extension Strengthening;Both;10 reps    Theraband Level (Shoulder Extension) Level 3 (Green)    Shoulder Extension Limitations 2 sets  Other Standing Lumbar Exercises core press isometric x 10 mod cues for breathing and form      Lumbar Exercises: Seated   Other Seated Lumbar Exercises seated trunk flexion with physio ball forward and laterals    Other Seated Lumbar Exercises seated pelvic tilt      Shoulder Exercises: Seated   Horizontal ABduction Strengthening;Both;10 reps    Theraband Level (Shoulder Horizontal ABduction) Level 1 (Yellow)    External Rotation 10 reps;Both;Theraband    Theraband Level (Shoulder External Rotation) Level 1 (Yellow)      Shoulder Exercises: Standing   Shoulder Flexion Weight (lbs) ball roll forward x 10      Shoulder Exercises: Pulleys   Flexion 3 minutes    Flexion Limitations eventually felt good      Shoulder Exercises: ROM/Strengthening   Nustep UE and LE for 5 min L3                    PT Short Term Goals - 08/09/20 1035       PT SHORT TERM GOAL #1   Title Pt will be independent with    Time 4    Period Weeks    Status New    Target Date 09/06/20      PT SHORT TERM GOAL #2   Title Report pain decrease in back and shoulder from 10/10 to at least 6/10    Baseline 10/10    Time 4    Period Weeks    Status New    Target Date 09/06/20      PT SHORT TERM GOAL #3   Title Demonstrate symmetry with gait on level surface with use of a cane or walker on the R    Baseline Left severe antalgic gait    Time 4    Period Weeks    Status New    Target Date 09/06/20      PT SHORT TERM GOAL #4   Title Pt will be able to perform 5 x STS and make LTG and to increase ease of rising to and from toilet    Baseline unable to come to standing without dependence with UE support and momentum    Time 4    Period Weeks    Status New    Target Date 09/06/20             PT Long Term Goals - 08/09/20 1036      PT LONG TERM GOAL #1   Title Pt will be independent with all advanced HEP given    Time 8    Period Weeks    Status New    Target Date 10/04/20      PT LONG TERM GOAL #2   Title FOTO will improve for back  from 1%   to38%  and shoulder from 41 to 66%     indicating improved functional mobility.    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    Baseline  unable to raise flex /abd greater than about 50 -60 degrees due to pain    Time 8    Period Weeks    Status New                 Plan - 08/14/20 0919    Clinical Impression Statement Patient was able to exercise today with light bands and feeling pain. She has alot of home equipment and we discussed using various props , bands for HEP.  She was encouraged to move regardless of how she feels to improve her mental and physical health.  Did feel better after session but did not rate her pain .    PT Treatment/Interventions ADLs/Self Care Home Management;Cryotherapy;Electrical Stimulation;Aquatic Therapy;Iontophoresis 4mg /ml Dexamethasone;Moist Heat;Traction;Ultrasound;Gait training;Stair training;Functional mobility training;Therapeutic activities;Therapeutic exercise;Balance training;Neuromuscular re-education;Manual techniques;Patient/family education;Passive range of motion;Dry needling;Taping;Joint Manipulations;Spinal Manipulations    PT Next Visit Plan Assess response and compliance with HEP. A/AROM, PROM, and initiation of light strengthening as tolerated. Gait training    PT Home Exercise Plan [email or web address]    Consulted and Agree with Plan of Care Patient           Patient will benefit from skilled therapeutic intervention in order to improve the following deficits and impairments:  Abnormal gait,Decreased activity tolerance,Decreased mobility,Decreased knowledge of precautions,Decreased knowledge of use of DME,Decreased range of motion,Decreased strength,Difficulty walking,Increased edema,Increased muscle spasms,Impaired perceived functional ability,Impaired sensation,Postural dysfunction,Obesity,Pain,Improper body mechanics,Impaired UE functional use  Visit Diagnosis: Acute right-sided low back pain with right-sided sciatica  Right shoulder pain, unspecified chronicity  Pain in left ankle and joints of left foot  Low back pain with sciatica, sciatica laterality unspecified,  unspecified back pain laterality, unspecified chronicity  Cramp and spasm  Other abnormalities of gait and mobility  Muscle weakness (generalized)     Problem List Patient Active Problem List   Diagnosis Date Noted  . Acute pain of left knee 11/09/2019  . Closed fracture of fifth toe of left foot 11/09/2019  . Displaced bimalleolar fracture of left lower leg, subsequent encounter for closed fracture with delayed healing   . Closed high lateral malleolus fracture, left, initial encounter   . Anemia, unspecified 02/15/2013  . Depression 12/09/2012  . Iron deficiency anemia 08/18/2011  . Vitamin D deficiency 08/18/2011  . Anxiety 06/02/2011  . Elevated blood pressure reading without diagnosis of hypertension 06/02/2011  . Joint pain 06/02/2011  . Migraine headache 06/02/2011  . Pes anserine bursitis 01/20/2011  . Edema 08/21/2010  . Nonallopathic lesion of lumbar region 07/04/2010  . Obesity, unspecified 03/27/2010  . DEPRESSION, MAJOR, MILD 03/27/2010  . Chronic pain syndrome 03/27/2010  . NONALLOPATHIC LESION OF THORACIC REGION NEC 03/27/2010  . FATIGUE 03/27/2010  . HEADACHE 03/27/2010    [name] 08/14/2020, 11:29 AM  [facility] [address] [place], [place], [place] Phone: [phone]   Fax:  [phone]  Name: [name] MRN: Hulen Shouts Date of Birth: 24-May-1976  10/18/1975, PT 08/14/20 11:30 AM Phone: [phone] Fax: [phone]

## 2020-08-16 ENCOUNTER — Other Ambulatory Visit: Payer: Self-pay

## 2020-08-16 ENCOUNTER — Ambulatory Visit: Payer: Medicare Other | Admitting: Physical Therapy

## 2020-08-16 ENCOUNTER — Encounter: Payer: Self-pay | Admitting: Physical Therapy

## 2020-08-16 DIAGNOSIS — R252 Cramp and spasm: Secondary | ICD-10-CM

## 2020-08-16 DIAGNOSIS — M6281 Muscle weakness (generalized): Secondary | ICD-10-CM

## 2020-08-16 DIAGNOSIS — M5441 Lumbago with sciatica, right side: Secondary | ICD-10-CM | POA: Diagnosis not present

## 2020-08-16 DIAGNOSIS — R2689 Other abnormalities of gait and mobility: Secondary | ICD-10-CM

## 2020-08-16 DIAGNOSIS — M25511 Pain in right shoulder: Secondary | ICD-10-CM

## 2020-08-16 DIAGNOSIS — R600 Localized edema: Secondary | ICD-10-CM

## 2020-08-16 DIAGNOSIS — M25572 Pain in left ankle and joints of left foot: Secondary | ICD-10-CM

## 2020-08-16 DIAGNOSIS — M5440 Lumbago with sciatica, unspecified side: Secondary | ICD-10-CM

## 2020-08-16 DIAGNOSIS — M25512 Pain in left shoulder: Secondary | ICD-10-CM

## 2020-08-16 NOTE — Therapy (Signed)
[facility] [address] [place], [place], [address] Phone: [phone]   Fax:  [phone]  Physical Therapy Treatment  Patient Details  Name: [name] MRN: [id number] Date of Birth: 01/24/1976 Referring Provider (PT): Lavada Mesi MD   Encounter Date: 08/16/2020   PT End of Session - 08/16/20 1141    Visit Number 4    Number of Visits 18    Date for PT Re-Evaluation 10/06/20    Authorization Type [employer] MCR/MCD    PT Start Time 1135    PT Stop Time 1205    PT Time Calculation (min) 30 min    Activity Tolerance Patient limited by pain    Behavior During Therapy [facility] for tasks assessed/performed           Past Medical History:  Diagnosis Date  . ADHD   . Anemia   . Anxiety   . Arthralgia   . Bimalleolar ankle fracture 07/19/2016   left  . Bipolar disorder (HCC)   . Chronic pain    hands, knees, back  . Depression   . Migraines   . Obesity     Past Surgical History:  Procedure Laterality Date  . CERVICAL CONIZATION W/BX  10/30/2011   Procedure: CONIZATION CERVIX WITH BIOPSY;  Surgeon: Kathreen Cosier, MD;  Location: [facility];  Service: Gynecology;  Laterality: N/A;  . DILATION AND CURETTAGE OF UTERUS  10/30/2011   Procedure: DILATATION AND CURETTAGE;  Surgeon: Kathreen Cosier, MD;  Location: [facility];  Service: Gynecology;  Laterality: N/A;  . DILITATION & CURRETTAGE/HYSTROSCOPY WITH HYDROTHERMAL ABLATION N/A 12/14/2013   Procedure: DILATATION & CURETTAGE/HYSTEROSCOPY WITH HYDROTHERMAL ABLATION;  Surgeon: Kathreen Cosier, MD;  Location: [facility];  Service: Gynecology;  Laterality: N/A;  . ORIF ANKLE FRACTURE Left 07/23/2016   Procedure: OPEN REDUCTION INTERNAL FIXATION (ORIF) LEFT BIMALLEOLAR ANKLE FRACTURE;  Surgeon: Tarry Kos, MD;  Location: [facility];  Service: Orthopedics;  Laterality: Left;  . TUBAL LIGATION  07/12/2003  . WISDOM TOOTH EXTRACTION      There were no vitals filed for  this visit.   Subjective Assessment - 08/16/20 1138    Subjective My back is hurting 7/10. My shoulder is OK today .    Currently in Pain? Yes    Pain Score 7     Pain Location Back    Pain Orientation Right;Left    Pain Descriptors / Indicators Aching    Pain Type Chronic pain    Pain Onset More than a month ago    Pain Frequency Constant    Aggravating Factors  moving    Pain Relieving Factors nothing really              [facility] Adult PT Treatment/Exercise - 08/16/20 0001      Lumbar Exercises: Stretches   Active Hamstring Stretch 3 reps      Lumbar Exercises: Standing   Wall Slides 10 reps    Wall Slides Limitations core focus, mini squat hold with ER and IR of shoulders   yellow T band   Other Standing Lumbar Exercises standing hip extension and abduction x 15      Lumbar Exercises: Supine   Ab Set 10 reps;5 seconds    AB Set Limitations ball squeeze    Pelvic Tilt 10 reps    Pelvic Tilt Limitations mod cues , added core engaged    Other Supine Lumbar Exercises core work with pilates circle: chest press  and overhead lift x 10   unable to keep knees bent due to pain     Shoulder Exercises: Supine   Horizontal ABduction Strengthening;Both;15 reps;Theraband    Theraband Level (Shoulder Horizontal ABduction) Level 1 (Yellow)                    PT Short Term Goals - 08/09/20 1035      PT SHORT TERM GOAL #1   Title Pt will be independent with    Time 4    Period Weeks    Status New    Target Date 09/06/20      PT SHORT TERM GOAL #2   Title Report pain decrease in back and shoulder from 10/10 to at least 6/10    Baseline 10/10    Time 4    Period Weeks    Status New    Target Date 09/06/20      PT SHORT TERM GOAL #3   Title Demonstrate symmetry with gait on level surface with use of a cane or walker on the R    Baseline Left severe antalgic gait    Time 4    Period Weeks    Status New    Target Date 09/06/20      PT SHORT TERM GOAL #4   Title  Pt will be able to perform 5 x STS and make LTG and to increase ease of rising to and from toilet    Baseline unable to come to standing without dependence with UE support and momentum    Time 4    Period Weeks    Status New    Target Date 09/06/20             PT Long Term Goals - 08/09/20 1036      PT LONG TERM GOAL #1   Title Pt will be independent with all advanced HEP given    Time 8    Period Weeks    Status New    Target Date 10/04/20      PT LONG TERM GOAL #2   Title FOTO will improve for back  from 1%   to38%  and shoulder from 41 to 66%     indicating improved functional mobility.    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    Baseline unable to raise flex /abd greater than about 50 -60 degrees due to pain    Time 8    Period Weeks    Status New  Plan - 08/16/20 1150    Clinical Impression Statement Increased pain today in low back, shoulder exercises increaed pain and she did not want to continue exercise.  offered modalities but she declined.  Encouraged to keep moving as best as she can.    PT Treatment/Interventions ADLs/Self Care Home Management;Cryotherapy;Electrical Stimulation;Aquatic Therapy;Iontophoresis 4mg /ml Dexamethasone;Moist Heat;Traction;Ultrasound;Gait training;Stair  training;Functional mobility training;Therapeutic activities;Therapeutic exercise;Balance training;Neuromuscular re-education;Manual techniques;Patient/family education;Passive range of motion;Dry needling;Taping;Joint Manipulations;Spinal Manipulations    PT Next Visit Plan Assess response and compliance with HEP. A/AROM, PROM, and initiation of light strengthening as tolerated. Gait training    PT Home Exercise Plan [id number]    Consulted and Agree with Plan of Care Patient           Patient will benefit from skilled therapeutic intervention in order to improve the following deficits and impairments:  Abnormal gait,Decreased activity tolerance,Decreased mobility,Decreased knowledge of precautions,Decreased knowledge of use of DME,Decreased range of motion,Decreased strength,Difficulty walking,Increased edema,Increased muscle spasms,Impaired perceived functional ability,Impaired sensation,Postural dysfunction,Obesity,Pain,Improper body mechanics,Impaired UE functional use  Visit Diagnosis: Right shoulder pain, unspecified chronicity  Pain in left ankle and joints of left foot  Low back pain with sciatica, sciatica laterality unspecified, unspecified back pain laterality, unspecified chronicity  Cramp and spasm  Acute right-sided low back pain with right-sided sciatica  Muscle weakness (generalized)  Acute pain of left shoulder  Localized edema  Other abnormalities of gait and mobility     Problem List Patient Active Problem List   Diagnosis Date Noted  . Acute pain of left knee 11/09/2019  . Closed fracture of fifth toe of left foot 11/09/2019  . Displaced bimalleolar fracture of left lower leg, subsequent encounter for closed fracture with delayed healing   . Closed high lateral malleolus fracture, left, initial encounter   . Anemia, unspecified 02/15/2013  . Depression 12/09/2012  . Iron deficiency anemia 08/18/2011  . Vitamin D deficiency 08/18/2011  . Anxiety  06/02/2011  . Elevated blood pressure reading without diagnosis of hypertension 06/02/2011  . Joint pain 06/02/2011  . Migraine headache 06/02/2011  . Pes anserine bursitis 01/20/2011  . Edema 08/21/2010  . Nonallopathic lesion of lumbar region 07/04/2010  . Obesity, unspecified 03/27/2010  . DEPRESSION, MAJOR, MILD 03/27/2010  . Chronic pain syndrome 03/27/2010  . NONALLOPATHIC LESION OF THORACIC REGION NEC 03/27/2010  . FATIGUE 03/27/2010  . HEADACHE 03/27/2010    Tosha Belgarde 08/16/2020, 12:08 PM  [facility] [address] [place], [place], [place] Phone: [phone]   Fax:  [phone]  Name: [name] MRN: [id number] Date of Birth: 05/31/1975  10/18/1975, PT 08/16/20 12:08 PM Phone: [phone] Fax: [phone]

## 2020-08-17 ENCOUNTER — Other Ambulatory Visit: Payer: Self-pay | Admitting: Internal Medicine

## 2020-08-17 DIAGNOSIS — N6310 Unspecified lump in the right breast, unspecified quadrant: Secondary | ICD-10-CM

## 2020-08-22 ENCOUNTER — Other Ambulatory Visit: Payer: Self-pay

## 2020-08-22 ENCOUNTER — Other Ambulatory Visit: Payer: Self-pay | Admitting: Obstetrics & Gynecology

## 2020-08-22 ENCOUNTER — Other Ambulatory Visit (HOSPITAL_COMMUNITY)
Admission: RE | Admit: 2020-08-22 | Discharge: 2020-08-22 | Disposition: A | Discharge status: Home / Self Care | Payer: Medicare Other | Source: Ambulatory Visit | Attending: Obstetrics & Gynecology | Admitting: Obstetrics & Gynecology

## 2020-08-22 ENCOUNTER — Ambulatory Visit (INDEPENDENT_AMBULATORY_CARE_PROVIDER_SITE_OTHER): Payer: Medicare Other | Admitting: Obstetrics & Gynecology

## 2020-08-22 ENCOUNTER — Encounter: Payer: Self-pay | Admitting: Obstetrics & Gynecology

## 2020-08-22 VITALS — BP 129/85 | HR 75 | Wt 324.5 lb

## 2020-08-22 DIAGNOSIS — N946 Dysmenorrhea, unspecified: Secondary | ICD-10-CM | POA: Diagnosis present

## 2020-08-22 DIAGNOSIS — N921 Excessive and frequent menstruation with irregular cycle: Secondary | ICD-10-CM | POA: Diagnosis present

## 2020-08-22 DIAGNOSIS — Z01419 Encounter for gynecological examination (general) (routine) without abnormal findings: Secondary | ICD-10-CM | POA: Diagnosis not present

## 2020-08-22 MED ORDER — NAPROXEN SODIUM 550 MG PO TABS: 550.0000 mg | ORAL_TABLET | Freq: Two times a day (BID) | ORAL | 1 refills | Status: DC

## 2020-08-22 NOTE — Patient Instructions (Signed)
Levonorgestrel intrauterine device (IUD) What is this medicine? LEVONORGESTREL IUD (LEE voe nor jes trel) is a contraceptive (birth control) device. The device is placed inside the uterus by a health care provider. It is used to prevent pregnancy. Some devices can also be used to treat heavy bleeding that occurs during your period. This medicine may be used for other purposes; ask your health care provider or pharmacist if you have questions. COMMON BRAND NAME(S): Kyleena, LILETTA, Mirena, Skyla What should I tell my health care provider before I take this medicine? They need to know if you have any of these conditions:  abnormal Pap smear  cancer of the breast, uterus, or cervix  diabetes  endometritis  genital or pelvic infection now or in the past  have more than one sexual partner or your partner has more than one partner  heart disease  history of an ectopic or tubal pregnancy  immune system problems  IUD in place  liver disease or tumor  problems with blood clots or take blood-thinners  seizures  use intravenous drugs  uterus of unusual shape  vaginal bleeding that has not been explained  an unusual or allergic reaction to levonorgestrel, other hormones, silicone, or polyethylene, medicines, foods, dyes, or preservatives  pregnant or trying to get pregnant  breast-feeding How should I use this medicine? This device is placed inside the uterus by a health care professional. Talk to your pediatrician regarding the use of this medicine in children. Special care may be needed. Overdosage: If you think you have taken too much of this medicine contact a poison control center or emergency room at once. NOTE: This medicine is only for you. Do not share this medicine with others. What if I miss a dose? This does not apply. Depending on the brand of device you have inserted, the device will need to be replaced every 3 to 7 years if you wish to continue using this type  of birth control. What may interact with this medicine? Do not take this medicine with any of the following medications:  amprenavir  bosentan  fosamprenavir This medicine may also interact with the following medications:  aprepitant  armodafinil  barbiturate medicines for inducing sleep or treating seizures  bexarotene  boceprevir  griseofulvin  medicines to treat seizures like carbamazepine, ethotoin, felbamate, oxcarbazepine, phenytoin, topiramate  modafinil  pioglitazone  rifabutin  rifampin  rifapentine  some medicines to treat HIV infection like atazanavir, efavirenz, indinavir, lopinavir, nelfinavir, tipranavir, ritonavir  St. John's wort  warfarin This list may not describe all possible interactions. Give your health care provider a list of all the medicines, herbs, non-prescription drugs, or dietary supplements you use. Also tell them if you smoke, drink alcohol, or use illegal drugs. Some items may interact with your medicine. What should I watch for while using this medicine? Visit your doctor or health care professional for regular check ups. See your doctor if you or your partner has sexual contact with others, becomes HIV positive, or gets a sexual transmitted disease. This product does not protect you against HIV infection (AIDS) or other sexually transmitted diseases. You can check the placement of the IUD yourself by reaching up to the top of your vagina with clean fingers to feel the threads. Do not pull on the threads. It is a good habit to check placement after each menstrual period. Call your doctor right away if you feel more of the IUD than just the threads or if you cannot feel the threads   at all. The IUD may come out by itself. You may become pregnant if the device comes out. If you notice that the IUD has come out use a backup birth control method like condoms and call your health care provider. Using tampons will not change the position of the  IUD and are okay to use during your period. This IUD can be safely scanned with magnetic resonance imaging (MRI) only under specific conditions. Before you have an MRI, tell your healthcare provider that you have an IUD in place, and which type of IUD you have in place. What side effects may I notice from receiving this medicine? Side effects that you should report to your doctor or health care professional as soon as possible:  allergic reactions like skin rash, itching or hives, swelling of the face, lips, or tongue  fever, flu-like symptoms  genital sores  high blood pressure  no menstrual period for 6 weeks during use  pain, swelling, warmth in the leg  pelvic pain or tenderness  severe or sudden headache  signs of pregnancy  stomach cramping  sudden shortness of breath  trouble with balance, talking, or walking  unusual vaginal bleeding, discharge  yellowing of the eyes or skin Side effects that usually do not require medical attention (report to your doctor or health care professional if they continue or are bothersome):  acne  breast pain  change in sex drive or performance  changes in weight  cramping, dizziness, or faintness while the device is being inserted  headache  irregular menstrual bleeding within first 3 to 6 months of use  nausea This list may not describe all possible side effects. Call your doctor for medical advice about side effects. You may report side effects to FDA at 1-800-FDA-1088. Where should I keep my medicine? This does not apply. NOTE: This sheet is a summary. It may not cover all possible information. If you have questions about this medicine, talk to your doctor, pharmacist, or health care provider.  2021 Elsevier/Gold Standard (2020-01-10 16:27:45) Menorrhagia Menorrhagia is when your monthly periods are heavy or last longer than normal. If you have this condition, bleeding and cramping may make it hard for you to do your  daily activities. What are the causes? Common causes of this condition include:  Growths in the womb (uterus). These are polyps or fibroids. These growths are not cancer.  Problems with two hormones called estrogen and progesterone.  One of the ovaries not releasing an egg during one or more months.  A problem with the thyroid gland.  Having a device for birth control (IUD).  Side effects of some medicines, such as NSAIDs or blood thinners.  A disorder that stops the blood from clotting normally. What increases the risk? You are more likely to have this condition if you have cancer of the womb. What are the signs or symptoms?  Having to change your pad or tampon every 1-2 hours because it is soaked.  Needing to use pads and tampons at the same time because of heavy bleeding.  Needing to wake up to change your pads or tampons during the night.  Passing blood clots larger than 1 inch (2.5 cm) in size.  Having bleeding that lasts for more than 7 days.  Having symptoms of low iron levels (anemia), such as feeling tired or having shortness of breath. How is this treated? You may not need to be treated for this condition. But if you need treatment, you may be given  medicines:  To reduce bleeding during your period. These include birth control medicines.  To make your blood thick. This slows bleeding.  To reduce swelling. Medicines that do this include ibuprofen.  That have a hormone called progestin.  That make the ovaries stop working for a short time.  To treat low iron levels. You will be given iron pills if you have this condition. If medicines do not work, surgery may be done. Surgery may be done to:  Remove a part of the lining of the womb. This lining is called the endometrium. This reduces bleeding during a period.  Remove growths in the womb. These may be polyps or fibroids.  Remove the entire lining of the womb.  Remove the womb entirely. This procedure is  called a hysterectomy.   Follow these instructions at home: Medicines  Take over-the-counter and prescription medicines only as told by your doctor. This includes iron pills.  Do not change or switch medicines without asking your doctor.  Do not take aspirin or medicines that contain aspirin 1 week before or during your period. Aspirin may make bleeding worse. Managing constipation Iron pills may cause trouble pooping (constipation). To prevent or treat problems when pooping, you may need to:  Drink enough fluid to keep your pee (urine) pale yellow.  Take over-the-counter or prescription medicines.  Eat foods that are high in fiber. These include beans, whole grains, and fresh fruits and vegetables.  Limit foods that are high in fat and sugar. These include fried or sweet foods. General instructions  If you need to change your pad or tampon more than once every 2 hours, limit your activity until the bleeding stops.  Eat healthy meals and foods that are high in iron. Foods that have a lot of iron include: ? Leafy green vegetables. ? Meat. ? Liver. ? Eggs. ? Whole-grain breads and cereals.  Do not try to lose weight until your heavy bleeding has stopped and you have normal amounts of iron in your blood. If you need to lose weight, work with your doctor.  Keep all follow-up visits. Contact a doctor if:  You soak through a pad or tampon every 1 or 2 hours, and this happens every time you have a period.  You need to use pads and tampons at the same time because you are bleeding so much.  You are taking medicine, and: ? You feel like you may vomit. ? You vomit. ? You have watery poop (diarrhea).  You have other problems that may be related to the medicine you are taking. Get help right away if:  You soak through more than a pad or tampon in 1 hour.  You pass clots bigger than 1 inch (2.5 cm) wide.  You feel short of breath.  You feel like your heart is beating too  fast.  You feel dizzy or you faint.  You feel very weak or tired. Summary  Menorrhagia is when your menstrual periods are heavy or last longer than normal.  You may not need to be treated for this condition. If you need treatment, you may be given medicines or have surgery.  Take over-the-counter and prescription medicines only as told by your doctor. This includes iron pills.  Get help right away if you soak through more than a pad or tampon in 1 hour or you pass large clots. Also, get help right away if you feel dizzy, short of breath, or very weak or tired. This information is not intended  to replace advice given to you by your health care provider. Make sure you discuss any questions you have with your health care provider. Document Revised: 01/24/2020 Document Reviewed: 01/24/2020 Elsevier Patient Education  2021 [employer].
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
## 2020-08-23 ENCOUNTER — Encounter: Payer: Self-pay | Admitting: Physical Therapy

## 2020-08-23 ENCOUNTER — Ambulatory Visit: Payer: Medicare Other | Admitting: Physical Therapy

## 2020-08-23 DIAGNOSIS — M25511 Pain in right shoulder: Secondary | ICD-10-CM

## 2020-08-23 DIAGNOSIS — M5441 Lumbago with sciatica, right side: Secondary | ICD-10-CM | POA: Diagnosis not present

## 2020-08-23 DIAGNOSIS — M6281 Muscle weakness (generalized): Secondary | ICD-10-CM

## 2020-08-23 DIAGNOSIS — M5440 Lumbago with sciatica, unspecified side: Secondary | ICD-10-CM

## 2020-08-23 DIAGNOSIS — R252 Cramp and spasm: Secondary | ICD-10-CM
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
## 2020-08-24 LAB — CYTOLOGY - PAP
Comment: NEGATIVE
Diagnosis: NEGATIVE
High risk HPV: NEGATIVE

## 2020-08-28 ENCOUNTER — Ambulatory Visit: Payer: Medicare Other | Admitting: Physical Therapy

## 2020-08-30 ENCOUNTER — Ambulatory Visit: Payer: Medicare Other | Admitting: Obstetrics and Gynecology

## 2020-08-31 ENCOUNTER — Encounter (HOSPITAL_COMMUNITY): Payer: Self-pay | Admitting: Emergency Medicine

## 2020-08-31 ENCOUNTER — Ambulatory Visit: Payer: Medicare Other | Admitting: Physical Therapy

## 2020-08-31 ENCOUNTER — Other Ambulatory Visit: Payer: Self-pay

## 2020-08-31 ENCOUNTER — Emergency Department (HOSPITAL_COMMUNITY): Payer: Medicare Other

## 2020-08-31 ENCOUNTER — Emergency Department (HOSPITAL_COMMUNITY)
Admission: EM | Admit: 2020-08-31 | Discharge: 2020-08-31 | Disposition: A | Discharge status: Home / Self Care | Payer: Medicare Other | Attending: Emergency Medicine | Admitting: Emergency Medicine

## 2020-08-31 DIAGNOSIS — M25512 Pain in left shoulder: Secondary | ICD-10-CM | POA: Diagnosis not present

## 2020-08-31 DIAGNOSIS — R109 Unspecified abdominal pain: Secondary | ICD-10-CM | POA: Diagnosis not present

## 2020-08-31 DIAGNOSIS — R0789 Other chest pain: Secondary | ICD-10-CM | POA: Diagnosis present

## 2020-08-31 DIAGNOSIS — Y92410 Unspecified street and highway as the place of occurrence of the external cause: Secondary | ICD-10-CM | POA: Diagnosis not present

## 2020-08-31 LAB — URINALYSIS, ROUTINE W REFLEX MICROSCOPIC
Bilirubin Urine: NEGATIVE
Glucose, UA: NEGATIVE mg/dL
Hgb urine dipstick: NEGATIVE
Ketones, ur: NEGATIVE mg/dL
Nitrite: NEGATIVE
Protein, ur: NEGATIVE mg/dL
Specific Gravity, Urine: 1.024 (ref 1.005–1.030)
pH: 5 (ref 5.0–8.0)

## 2020-08-31 LAB — CBC WITH DIFFERENTIAL/PLATELET
Abs Immature Granulocytes: 0.02 10*3/uL (ref 0.00–0.07)
Basophils Absolute: 0 10*3/uL (ref 0.0–0.1)
Basophils Relative: 1 %
Eosinophils Absolute: 0 10*3/uL (ref 0.0–0.5)
Eosinophils Relative: 1 %
HCT: 41.2 % (ref 36.0–46.0)
Hemoglobin: 12.1 g/dL (ref 12.0–15.0)
Immature Granulocytes: 0 %
Lymphocytes Relative: 31 %
Lymphs Abs: 2.1 10*3/uL (ref 0.7–4.0)
MCH: 20.5 pg — ABNORMAL LOW (ref 26.0–34.0)
MCHC: 29.4 g/dL — ABNORMAL LOW (ref 30.0–36.0)
MCV: 69.8 fL — ABNORMAL LOW (ref 80.0–100.0)
Monocytes Absolute: 0.5 10*3/uL (ref 0.1–1.0)
Monocytes Relative: 7 %
Neutro Abs: 4.3 10*3/uL (ref 1.7–7.7)
Neutrophils Relative %: 60 %
Platelets: 229 10*3/uL (ref 150–400)
RBC: 5.9 MIL/uL — ABNORMAL HIGH (ref 3.87–5.11)
RDW: 17.8 % — ABNORMAL HIGH (ref 11.5–15.5)
WBC: 7 10*3/uL (ref 4.0–10.5)
nRBC: 0 % (ref 0.0–0.2)

## 2020-08-31 LAB — COMPREHENSIVE METABOLIC PANEL
ALT: 14 U/L (ref 0–44)
AST: 14 U/L — ABNORMAL LOW (ref 15–41)
Albumin: 3.1 g/dL — ABNORMAL LOW (ref 3.5–5.0)
Alkaline Phosphatase: 60 U/L (ref 38–126)
Anion gap: 5 (ref 5–15)
BUN: 12 mg/dL (ref 6–20)
CO2: 24 mmol/L (ref 22–32)
Calcium: 8.5 mg/dL — ABNORMAL LOW (ref 8.9–10.3)
Chloride: 108 mmol/L (ref 98–111)
Creatinine, Ser: 0.68 mg/dL (ref 0.44–1.00)
GFR, Estimated: >60 mL/min (ref >60)
Glucose, Bld: 106 mg/dL — ABNORMAL HIGH (ref 70–99)
Potassium: 3.9 mmol/L (ref 3.5–5.1)
Sodium: 137 mmol/L (ref 135–145)
Total Bilirubin: 0.5 mg/dL (ref 0.3–1.2)
Total Protein: 6.7 g/dL (ref 6.5–8.1)

## 2020-08-31 LAB — LIPASE, BLOOD: Lipase: 27 U/L (ref 11–51)

## 2020-08-31 MED ORDER — ACETAMINOPHEN 500 MG PO TABS
1000.0000 mg | ORAL_TABLET | Freq: Once | ORAL | Status: AC
  Administered 2020-08-31: 1000 mg via ORAL
  Filled 2020-08-31: qty 2

## 2020-08-31 NOTE — ED Notes (Signed)
Patient is back from xray at this time

## 2020-08-31 NOTE — ED Notes (Signed)
Patient discharge instructions reviewed with the patient. The patient verbalized understanding of instructions. Patient discharged. 

## 2020-08-31 NOTE — ED Notes (Signed)
Drawing patients blood at this time urine sent to lab. Awaiting xray results. Patient denies needs at this time.
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
## 2020-08-31 NOTE — Discharge Instructions (Signed)
You can take 600 mg of ibuprofen every 6 hours, you can take 1000 mg of Tylenol every 6 hours, you can alternate these every 3 or you can take them together.  

## 2020-08-31 NOTE — ED Notes (Signed)
Patient transported to X-ray 

## 2020-09-04 ENCOUNTER — Encounter: Payer: Self-pay | Admitting: Physical Therapy

## 2020-09-04 ENCOUNTER — Other Ambulatory Visit: Payer: Self-pay

## 2020-09-04 ENCOUNTER — Ambulatory Visit: Payer: Medicare Other | Attending: Family Medicine | Admitting: Physical Therapy

## 2020-09-04 DIAGNOSIS — R2689 Other abnormalities of gait and mobility: Secondary | ICD-10-CM | POA: Diagnosis present

## 2020-09-04 DIAGNOSIS — M25512 Pain in left shoulder: Secondary | ICD-10-CM | POA: Diagnosis present

## 2020-09-04 DIAGNOSIS — M25572 Pain in left ankle and joints of left foot: Secondary | ICD-10-CM | POA: Diagnosis present

## 2020-09-04 DIAGNOSIS — M25511 Pain in right shoulder: Secondary | ICD-10-CM | POA: Insufficient documentation

## 2020-09-04 DIAGNOSIS — M5440 Lumbago with sciatica, unspecified side: Secondary | ICD-10-CM | POA: Insufficient documentation

## 2020-09-04 DIAGNOSIS — R600 Localized edema: Secondary | ICD-10-CM | POA: Insufficient documentation

## 2020-09-04 DIAGNOSIS — R252 Cramp and spasm: Secondary | ICD-10-CM | POA: Diagnosis present

## 2020-09-04 DIAGNOSIS — M5441 Lumbago with sciatica, right side: Secondary | ICD-10-CM | POA: Diagnosis present

## 2020-09-04 DIAGNOSIS — M6281 Muscle weakness (generalized): Secondary | ICD-10-CM | POA: Insufficient documentation
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
## 2020-09-06 ENCOUNTER — Ambulatory Visit: Payer: Medicare Other | Admitting: Physical Therapy

## 2020-09-11 ENCOUNTER — Other Ambulatory Visit: Payer: Self-pay

## 2020-09-11 ENCOUNTER — Ambulatory Visit: Payer: Medicare Other | Admitting: Physical Therapy

## 2020-09-11 ENCOUNTER — Encounter: Payer: Self-pay | Admitting: Physical Therapy

## 2020-09-11 DIAGNOSIS — M25512 Pain in left shoulder: Secondary | ICD-10-CM

## 2020-09-11 DIAGNOSIS — R252 Cramp and spasm: Secondary | ICD-10-CM

## 2020-09-11 DIAGNOSIS — M25572 Pain in left ankle and joints of left foot: Secondary | ICD-10-CM

## 2020-09-11 DIAGNOSIS — M5441 Lumbago with sciatica, right side: Secondary | ICD-10-CM

## 2020-09-11 DIAGNOSIS — M6281 Muscle weakness (generalized): Secondary | ICD-10-CM

## 2020-09-11 DIAGNOSIS — M25511 Pain in right shoulder: Secondary | ICD-10-CM | POA: Diagnosis not present

## 2020-09-11 DIAGNOSIS — R2689 Other abnormalities of gait and mobility: Secondary | ICD-10-CM

## 2020-09-11 DIAGNOSIS — M5440 Lumbago with sciatica, unspecified side: Secondary | ICD-10-CM

## 2020-09-11 DIAGNOSIS — R600 Localized edema: Secondary | ICD-10-CM
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
## 2020-09-13 ENCOUNTER — Encounter: Payer: Self-pay | Admitting: Physical Therapy

## 2020-09-13 ENCOUNTER — Ambulatory Visit: Payer: Medicare Other | Admitting: Physical Therapy

## 2020-09-13 ENCOUNTER — Other Ambulatory Visit: Payer: Self-pay

## 2020-09-13 DIAGNOSIS — M25512 Pain in left shoulder: Secondary | ICD-10-CM

## 2020-09-13 DIAGNOSIS — M5440 Lumbago with sciatica, unspecified side: Secondary | ICD-10-CM

## 2020-09-13 DIAGNOSIS — R2689 Other abnormalities of gait and mobility: Secondary | ICD-10-CM

## 2020-09-13 DIAGNOSIS — M25511 Pain in right shoulder: Secondary | ICD-10-CM

## 2020-09-13 DIAGNOSIS — M25572 Pain in left ankle and joints of left foot: Secondary | ICD-10-CM

## 2020-09-13 DIAGNOSIS — R600 Localized edema: Secondary | ICD-10-CM

## 2020-09-13 DIAGNOSIS — M6281 Muscle weakness (generalized): Secondary | ICD-10-CM

## 2020-09-13 DIAGNOSIS — M5441 Lumbago with sciatica, right side: Secondary | ICD-10-CM

## 2020-09-13 DIAGNOSIS — R252 Cramp and spasm: Secondary | ICD-10-CM
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
## 2020-09-16 ENCOUNTER — Emergency Department (HOSPITAL_COMMUNITY)
Admission: EM | Admit: 2020-09-16 | Discharge: 2020-09-16 | Disposition: A | Discharge status: Home / Self Care | Payer: Medicare Other | Attending: Emergency Medicine | Admitting: Emergency Medicine

## 2020-09-16 DIAGNOSIS — R1084 Generalized abdominal pain: Secondary | ICD-10-CM | POA: Diagnosis present

## 2020-09-16 DIAGNOSIS — Y92410 Unspecified street and highway as the place of occurrence of the external cause: Secondary | ICD-10-CM | POA: Diagnosis not present

## 2020-09-16 DIAGNOSIS — R42 Dizziness and giddiness: Secondary | ICD-10-CM | POA: Insufficient documentation

## 2020-09-16 LAB — CBC WITH DIFFERENTIAL/PLATELET
Abs Immature Granulocytes: 0.03 10*3/uL (ref 0.00–0.07)
Basophils Absolute: 0 10*3/uL (ref 0.0–0.1)
Basophils Relative: 1 %
Eosinophils Absolute: 0.1 10*3/uL (ref 0.0–0.5)
Eosinophils Relative: 1 %
HCT: 39 % (ref 36.0–46.0)
Hemoglobin: 11.5 g/dL — ABNORMAL LOW (ref 12.0–15.0)
Immature Granulocytes: 0 %
Lymphocytes Relative: 31 %
Lymphs Abs: 2.3 10*3/uL (ref 0.7–4.0)
MCH: 20.5 pg — ABNORMAL LOW (ref 26.0–34.0)
MCHC: 29.5 g/dL — ABNORMAL LOW (ref 30.0–36.0)
MCV: 69.5 fL — ABNORMAL LOW (ref 80.0–100.0)
Monocytes Absolute: 0.6 10*3/uL (ref 0.1–1.0)
Monocytes Relative: 7 %
Neutro Abs: 4.5 10*3/uL (ref 1.7–7.7)
Neutrophils Relative %: 60 %
Platelets: 211 10*3/uL (ref 150–400)
RBC: 5.61 MIL/uL — ABNORMAL HIGH (ref 3.87–5.11)
RDW: 17.1 % — ABNORMAL HIGH (ref 11.5–15.5)
WBC: 7.4 10*3/uL (ref 4.0–10.5)
nRBC: 0 % (ref 0.0–0.2)

## 2020-09-16 LAB — URINALYSIS, ROUTINE W REFLEX MICROSCOPIC
Bilirubin Urine: NEGATIVE
Glucose, UA: NEGATIVE mg/dL
Hgb urine dipstick: NEGATIVE
Ketones, ur: NEGATIVE mg/dL
Leukocytes,Ua: NEGATIVE
Nitrite: NEGATIVE
Protein, ur: NEGATIVE mg/dL
Specific Gravity, Urine: 1.016 (ref 1.005–1.030)
pH: 6 (ref 5.0–8.0)

## 2020-09-16 LAB — COMPREHENSIVE METABOLIC PANEL
ALT: 13 U/L (ref 0–44)
AST: 13 U/L — ABNORMAL LOW (ref 15–41)
Albumin: 3 g/dL — ABNORMAL LOW (ref 3.5–5.0)
Alkaline Phosphatase: 72 U/L (ref 38–126)
Anion gap: 6 (ref 5–15)
BUN: 8 mg/dL (ref 6–20)
CO2: 26 mmol/L (ref 22–32)
Calcium: 8.8 mg/dL — ABNORMAL LOW (ref 8.9–10.3)
Chloride: 105 mmol/L (ref 98–111)
Creatinine, Ser: 0.65 mg/dL (ref 0.44–1.00)
GFR, Estimated: >60 mL/min (ref >60)
Glucose, Bld: 121 mg/dL — ABNORMAL HIGH (ref 70–99)
Potassium: 3.9 mmol/L (ref 3.5–5.1)
Sodium: 137 mmol/L (ref 135–145)
Total Bilirubin: 0.3 mg/dL (ref 0.3–1.2)
Total Protein: 6.4 g/dL — ABNORMAL LOW (ref 6.5–8.1)

## 2020-09-16 LAB — PREGNANCY, URINE: Preg Test, Ur: NEGATIVE

## 2020-09-16 LAB — LIPASE, BLOOD: Lipase: 22 U/L (ref 11–51)

## 2020-09-16 MED ORDER — METHOCARBAMOL 500 MG PO TABS
750.0000 mg | ORAL_TABLET | Freq: Once | ORAL | Status: AC
  Administered 2020-09-16: 750 mg via ORAL
  Filled 2020-09-16: qty 2

## 2020-09-16 MED ORDER — ACETAMINOPHEN 500 MG PO TABS
1000.0000 mg | ORAL_TABLET | Freq: Once | ORAL | Status: AC
  Administered 2020-09-16: 1000 mg via ORAL
  Filled 2020-09-16: qty 2

## 2020-09-16 MED ORDER — METHOCARBAMOL 500 MG PO TABS: 500.0000 mg | ORAL_TABLET | Freq: Two times a day (BID) | ORAL | 0 refills | Status: DC

## 2020-09-16 MED ORDER — METHOCARBAMOL 1000 MG/10ML IJ SOLN
1000.0000 mg | Freq: Once | INTRAMUSCULAR | Status: DC
  Filled 2020-09-16: qty 10
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
## 2020-09-16 NOTE — Discharge Instructions (Signed)
Please drink plenty of water take Tylenol and ibuprofen as discussed below.  Use Robaxin instead of Flexeril.  Please use Tylenol or ibuprofen for pain.  You may use 600 mg ibuprofen every 6 hours or 1000 mg of Tylenol every 6 hours.  You may choose to alternate between the 2.  This would be most effective.  Not to exceed 4 g of Tylenol within 24 hours.  Not to exceed 3200 mg ibuprofen 24 hours.

## 2020-09-18 ENCOUNTER — Encounter: Payer: Self-pay | Admitting: Physical Therapy

## 2020-09-18 ENCOUNTER — Ambulatory Visit: Payer: Medicare Other | Admitting: Physical Therapy

## 2020-09-18 ENCOUNTER — Other Ambulatory Visit: Payer: Self-pay

## 2020-09-18 DIAGNOSIS — M25572 Pain in left ankle and joints of left foot: Secondary | ICD-10-CM

## 2020-09-18 DIAGNOSIS — R600 Localized edema: Secondary | ICD-10-CM

## 2020-09-18 DIAGNOSIS — M6281 Muscle weakness (generalized): Secondary | ICD-10-CM

## 2020-09-18 DIAGNOSIS — M5441 Lumbago with sciatica, right side: Secondary | ICD-10-CM

## 2020-09-18 DIAGNOSIS — M25511 Pain in right shoulder: Secondary | ICD-10-CM

## 2020-09-18 DIAGNOSIS — M5440 Lumbago with sciatica, unspecified side: Secondary | ICD-10-CM

## 2020-09-18 DIAGNOSIS — R2689 Other abnormalities of gait and mobility: Secondary | ICD-10-CM

## 2020-09-18 DIAGNOSIS — R252 Cramp and spasm: Secondary | ICD-10-CM

## 2020-09-18 DIAGNOSIS — M25512 Pain in left shoulder: Secondary | ICD-10-CM

## 2020-09-18 NOTE — Therapy (Signed)
[facility] [address] [place], [place], [address] Phone: [phone]   Fax:  [phone]  Physical Therapy Treatment  Patient Details  Name: [name] MRN: [id number] Date of Birth: [birth year] Referring Provider (PT): Lavada Mesi MD   Encounter Date: 09/18/2020   PT End of Session - 09/18/20 1139    Visit Number 9    Number of Visits 18    Date for PT Re-Evaluation 10/06/20    Authorization Type UHC MCR/MCD    PT Start Time 1135    PT Stop Time 1215    PT Time Calculation (min) 40 min    Activity Tolerance Patient tolerated treatment well    Behavior During Therapy [facility] for tasks assessed/performed           Past Medical History:  Diagnosis Date  . ADHD   . Anemia   . Anxiety   . Arthralgia   . Bimalleolar ankle fracture 07/19/2016   left  . Bipolar disorder (HCC)   . Chronic pain    hands, knees, back  . Depression   . Migraines   . Obesity     Past Surgical History:  Procedure Laterality Date  . CERVICAL CONIZATION W/BX  10/30/2011   Procedure: CONIZATION CERVIX WITH BIOPSY;  Surgeon: Kathreen Cosier, MD;  Location: [facility];  Service: Gynecology;  Laterality: N/A;  . DILATION AND CURETTAGE OF UTERUS  10/30/2011   Procedure: DILATATION AND CURETTAGE;  Surgeon: Kathreen Cosier, MD;  Location: [facility];  Service: Gynecology;  Laterality: N/A;  . DILITATION & CURRETTAGE/HYSTROSCOPY WITH HYDROTHERMAL ABLATION N/A 12/14/2013   Procedure: DILATATION & CURETTAGE/HYSTEROSCOPY WITH HYDROTHERMAL ABLATION;  Surgeon: Kathreen Cosier, MD;  Location: [facility];  Service: Gynecology;  Laterality: N/A;  . ORIF ANKLE FRACTURE Left 07/23/2016   Procedure: OPEN REDUCTION INTERNAL FIXATION (ORIF) LEFT BIMALLEOLAR ANKLE FRACTURE;  Surgeon: Tarry Kos, MD;  Location: [facility];  Service: Orthopedics;  Laterality: Left;  . TUBAL LIGATION  07/12/2003  . WISDOM TOOTH EXTRACTION      There were no vitals  filed for this visit.   Subjective Assessment - 09/18/20 1138    Subjective I went to the ED the other day. My chest was hurting so bad.  I was worried it was my heart. Moving slowly. not hurting today.Back is ok. Foot is ok.    Currently in Pain? Yes               [facility] Adult PT Treatment/Exercise - 09/18/20 0001      Lumbar Exercises: Aerobic   Nustep 10 min end of session , L6 Ue and LE      Lumbar Exercises: Machines for Strengthening   Other Lumbar Machine Exercise lat pull 4 plates x 15 standing and then row x 15      Lumbar Exercises: Standing   Other Standing Lumbar Exercises Palloff press blue band x 15 then added rotation x 10   mod cues     Lumbar Exercises: Sidelying   Clam 20 reps      Shoulder Exercises: Standing   Horizontal ABduction Strengthening;Both;10 reps    Theraband Level (Shoulder Horizontal ABduction) Level 2 (Red)    Extension 20 reps    Theraband Level (Shoulder Extension) Level 2 (Red)    Row Both;Theraband;20 reps    Theraband Level (Shoulder Row) Level 2 (Red)      Shoulder Exercises: Pulleys   Flexion 2 minutes    Scaption  2 minutes      Shoulder Exercises: ROM/Strengthening   Other ROM/Strengthening Exercises sidelying upper trunk rotation x 5 each side      Shoulder Exercises: Stretch   Corner Stretch 3 reps;30 seconds    Other Shoulder Stretches sidelying shoulder , upper trunk rotation x 10                  PT Education - 09/18/20 1343    Education Details musculoskeletal pain vs chest pain    Person(s) Educated Patient    Methods Explanation    Comprehension Verbalized understanding            PT Short Term Goals - 09/18/20 1143      PT SHORT TERM GOAL #1   Title Pt will be independent with HEP initial    Status Achieved      PT SHORT TERM GOAL #2   Title Report pain decrease in back and shoulder from 10/10 to at least 6/10    Status Achieved      PT SHORT TERM GOAL #3   Title Demonstrate symmetry with gait  on level surface with use of a cane or walker on the R    Baseline analgic gait but complains of no pain    Status On-going      PT SHORT TERM GOAL #4   Title Pt will be able to perform 5 x STS and make LTG and to increase ease of rising to and from toilet    Status Achieved             PT Long Term Goals - 09/11/20 1421      PT LONG TERM GOAL #1   Title Pt will be independent with all advanced HEP given    Status On-going      PT LONG TERM GOAL #2   Title FOTO will improve for back  from 1%   to38%  and shoulder from 41 to 66%     indicating improved functional mobility.    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    Status Unable to assess                 Plan - 09/18/20 1144    Clinical Impression Statement Did not complain of pain today, other than L shoulder pain with corner stretching  Her chest pain is musculoskeletal.  Spent time describing this to her. Transitions are difficult but she was also willing to use various modes of exercise.  Has alot of equipment at home, needs to begin to transition to this as progres has been limited.    PT Treatment/Interventions ADLs/Self Care Home Management;Cryotherapy;Electrical Stimulation;Aquatic Therapy;Iontophoresis 4mg /ml Dexamethasone;Moist Heat;Traction;Ultrasound;Gait training;Stair training;Functional mobility training;Therapeutic activities;Therapeutic exercise;Balance training;Neuromuscular re-education;Manual techniques;Patient/family education;Passive range of motion;Dry needling;Taping;Joint  Manipulations;Spinal Manipulations    PT Next Visit Plan combo UE and LE conditioning, standing core, IFC  PT Home Exercise Plan [id number]    Consulted and Agree with Plan of Care Patient           Patient will benefit from skilled therapeutic intervention in order to improve the following deficits and impairments:  Abnormal gait,Decreased activity tolerance,Decreased mobility,Decreased knowledge of precautions,Decreased knowledge of use of DME,Decreased range of motion,Decreased strength,Difficulty walking,Increased edema,Increased muscle spasms,Impaired perceived functional ability,Impaired sensation,Postural dysfunction,Obesity,Pain,Improper body mechanics,Impaired UE functional use  Visit Diagnosis: Right shoulder pain, unspecified chronicity  Low back pain with sciatica, sciatica laterality unspecified, unspecified back pain laterality, unspecified chronicity  Cramp and spasm  Acute right-sided low back pain with right-sided sciatica  Muscle weakness (generalized)  Pain in left ankle and joints of left foot  Acute pain of left shoulder  Localized edema  Other abnormalities of gait and mobility     Problem List Patient Active Problem List   Diagnosis Date Noted  . Acute pain of left knee 11/09/2019  . Closed fracture of fifth toe of left foot 11/09/2019  . Displaced bimalleolar fracture of left lower leg, subsequent encounter for closed fracture with delayed healing   . Closed high lateral malleolus fracture, left, initial encounter   . Anemia, unspecified 02/15/2013  . Depression 12/09/2012  . Iron deficiency anemia 08/18/2011  . Vitamin D deficiency 08/18/2011  . Anxiety 06/02/2011  . Elevated blood pressure reading without diagnosis of hypertension 06/02/2011  . Joint pain 06/02/2011  . Migraine headache 06/02/2011  . Pes anserine bursitis 01/20/2011  . Edema 08/21/2010  . Nonallopathic lesion of lumbar region 07/04/2010  . Obesity, unspecified 03/27/2010   . DEPRESSION, MAJOR, MILD 03/27/2010  . Chronic pain syndrome 03/27/2010  . NONALLOPATHIC LESION OF THORACIC REGION NEC 03/27/2010  . FATIGUE 03/27/2010  . HEADACHE 03/27/2010    Alysiah Suppa 09/18/2020, 1:50 PM  [facility] [address] [place], [place], [address] Phone: [phone]   Fax:  [phone]  Name: [name] MRN: [id number] Date of Birth: [birth year]  Karie Mainland, PT 09/18/20 1:50 PM Phone: [phone] Fax: [phone]

## 2020-09-20 ENCOUNTER — Other Ambulatory Visit: Payer: Self-pay

## 2020-09-20 ENCOUNTER — Ambulatory Visit: Payer: Medicare Other | Admitting: Physical Therapy

## 2020-09-20 DIAGNOSIS — M25511 Pain in right shoulder: Secondary | ICD-10-CM

## 2020-09-20 DIAGNOSIS — R2689 Other abnormalities of gait and mobility: Secondary | ICD-10-CM

## 2020-09-20 DIAGNOSIS — M25512 Pain in left shoulder: Secondary | ICD-10-CM

## 2020-09-20 DIAGNOSIS — M6281 Muscle weakness (generalized): Secondary | ICD-10-CM

## 2020-09-20 DIAGNOSIS — M5440 Lumbago with sciatica, unspecified side: Secondary | ICD-10-CM

## 2020-09-20 DIAGNOSIS — M5441 Lumbago with sciatica, right side: Secondary | ICD-10-CM

## 2020-09-20 DIAGNOSIS — M25572 Pain in left ankle and joints of left foot: Secondary | ICD-10-CM

## 2020-09-20 DIAGNOSIS — R600 Localized edema: Secondary | ICD-10-CM

## 2020-09-20 DIAGNOSIS — R252 Cramp and spasm: Secondary | ICD-10-CM

## 2020-09-20 NOTE — Therapy (Signed)
[facility] [address] [place], [place], [address] Phone: [phone]   Fax:  [phone]  Physical Therapy Treatment  Patient Details  Name: [name] MRN: [id number] Date of Birth: 1976/02/20 Referring Provider (PT): Hilts, Michael MD    Progress Note  Reporting Period to 06/19/20 to 09/20/20  See note below for Objective Data and Assessment of Progress/Goals.      Encounter Date: 09/20/2020   PT End of Session - 09/20/20 1150    Visit Number 10    Number of Visits 18    Date for PT Re-Evaluation 10/06/20    Authorization Type UHC MCR/MCD    PT Start Time 1135    PT Stop Time 1215    PT Time Calculation (min) 40 min    Activity Tolerance Patient tolerated treatment well    Behavior During Therapy WFL for tasks assessed/performed           Past Medical History:  Diagnosis Date  . ADHD   . Anemia   . Anxiety   . Arthralgia   . Bimalleolar ankle fracture 07/19/2016   left  . Bipolar disorder (HCC)   . Chronic pain    hands, knees, back  . Depression   . Migraines   . Obesity     Past Surgical History:  Procedure Laterality Date  . CERVICAL CONIZATION W/BX  10/30/2011   Procedure: CONIZATION CERVIX WITH BIOPSY;  Surgeon: Kathreen Cosier, MD;  Location: [facility];  Service: Gynecology;  Laterality: N/A;  . DILATION AND CURETTAGE OF UTERUS  10/30/2011   Procedure: DILATATION AND CURETTAGE;  Surgeon: Kathreen Cosier, MD;  Location: [facility];  Service: Gynecology;  Laterality: N/A;  . DILITATION & CURRETTAGE/HYSTROSCOPY WITH HYDROTHERMAL ABLATION N/A 12/14/2013   Procedure: DILATATION & CURETTAGE/HYSTEROSCOPY WITH HYDROTHERMAL ABLATION;  Surgeon: Kathreen Cosier, MD;  Location: [facility];  Service: Gynecology;  Laterality: N/A;  . ORIF ANKLE FRACTURE Left 07/23/2016   Procedure: OPEN REDUCTION INTERNAL FIXATION (ORIF) LEFT BIMALLEOLAR ANKLE FRACTURE;  Surgeon: Tarry Kos, MD;  Location: [facility];  Service: Orthopedics;  Laterality: Left;  . TUBAL LIGATION  07/12/2003  . WISDOM TOOTH EXTRACTION      There were no vitals filed for this visit.   Subjective Assessment - 09/20/20 1142    Subjective Back, hip 3/10.  I took medicine today.    Currently in Pain? Yes    Pain Score 3     Pain Location Back    Pain Orientation Right    Pain Descriptors / Indicators Aching    Pain Type Chronic pain    Pain Radiating Towards R hip    Pain Onset More than a month ago    Pain Frequency Constant    Aggravating Factors  standing    Pain Relieving Factors stretching , exercise    Multiple Pain Sites Yes    Pain Score 1   "a little bit"   Pain Location Shoulder    Pain Orientation Left    Pain Descriptors / Indicators Aching    Pain Type Chronic pain    Pain Onset More than a month ago    Pain Frequency Intermittent                 [facility] Adult PT Treatment/Exercise - 09/20/20 0001      Lumbar Exercises: Stretches   Active Hamstring Stretch 2 reps;30 seconds    Piriformis Stretch 3 reps;Left;Right  Piriformis Stretch Limitations better with a seated modified pigeon for R hip,    Other Lumbar Stretch Exercise does a version of lower trunk rotation not given in clinic      Lumbar Exercises: Aerobic   Nustep 10 min L7 UE and LE discused plans post PT during and subjective      Lumbar Exercises: Machines for Strengthening   Leg Press 1 plates x 15 , 2 plates x 15    Other Lumbar Machine Exercise lat pull 4 plates x 15 standing and then row x 15      Lumbar Exercises: Standing   Other Standing Lumbar Exercises hip abd/ext x 10 each      Knee/Hip Exercises: Standing   Heel Raises Both;1 set;15 reps    Forward Step Up Both;1 set;10 reps;Hand Hold: 2;Step Height: 6"                    PT Short Term Goals - 09/18/20 1143      PT SHORT TERM GOAL #1   Title Pt will be independent with HEP initial    Status Achieved      PT SHORT TERM GOAL #2   Title  Report pain decrease in back and shoulder from 10/10 to at least 6/10    Status Achieved      PT SHORT TERM GOAL #3   Title Demonstrate symmetry with gait on level surface with use of a cane or walker on the R    Baseline analgic gait but complains of no pain    Status On-going      PT SHORT TERM GOAL #4   Title Pt will be able to perform 5 x STS and make LTG and to increase ease of rising to and from toilet    Status Achieved             PT Long Term Goals - 09/11/20 1421      PT LONG TERM GOAL #1   Title Pt will be independent with all advanced HEP given    Status On-going      PT LONG TERM GOAL #2   Title FOTO will improve for back  from 1%   to38%  and shoulder from 41 to 66%     indicating improved functional mobility.    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    Status Unable to assess                 Plan - 09/20/20 1152    Clinical Impression Statement Began to prepare for upcoming DC within the next week. She was encouraged to continue to use her home equipment as well.  She shows increased effort with walking, transfers but able to do exercises with mostly fatigue.  Standing on rt LE for hip strength does increase Rt hip, back pain. cont to advance exercise tolerance and work  towards DC>    PT Treatment/Interventions ADLs/Self Care Home Management;Cryotherapy;Electrical Stimulation;Aquatic Therapy;Iontophoresis 4mg /ml Dexamethasone;Moist Heat;Traction;Ultrasound;Gait training;Stair training;Functional mobility training;Therapeutic activities;Therapeutic  exercise;Balance training;Neuromuscular re-education;Manual techniques;Patient/family education;Passive range of motion;Dry needling;Taping;Joint Manipulations;Spinal Manipulations    PT Next Visit Plan combo UE and LE conditioning, standing core, IFC    PT Home Exercise Plan [id number]    Consulted and Agree with Plan of Care Patient           Patient will benefit from skilled therapeutic intervention in order to improve the following deficits and impairments:  Abnormal gait,Decreased activity tolerance,Decreased mobility,Decreased knowledge of precautions,Decreased knowledge of use of DME,Decreased range of motion,Decreased strength,Difficulty walking,Increased edema,Increased muscle spasms,Impaired perceived functional ability,Impaired sensation,Postural dysfunction,Obesity,Pain,Improper body mechanics,Impaired UE functional use  Visit Diagnosis: Right shoulder pain, unspecified chronicity  Low back pain with sciatica, sciatica laterality unspecified, unspecified back pain laterality, unspecified chronicity  Cramp and spasm  Acute right-sided low back pain with right-sided sciatica  Muscle weakness (generalized)  Pain in left ankle and joints of left foot  Acute pain of left shoulder  Localized edema  Other abnormalities of gait and mobility     Problem List Patient Active Problem List   Diagnosis Date Noted  . Acute pain of left knee 11/09/2019  . Closed fracture of fifth toe of left foot 11/09/2019  . Displaced bimalleolar fracture of left lower leg, subsequent encounter for closed fracture with delayed healing   . Closed high lateral malleolus fracture, left, initial encounter   . Anemia, unspecified 02/15/2013  . Depression 12/09/2012  . Iron deficiency anemia 08/18/2011  . Vitamin D deficiency 08/18/2011  . Anxiety 06/02/2011  . Elevated blood pressure reading without diagnosis of hypertension 06/02/2011  . Joint pain 06/02/2011  . Migraine headache 06/02/2011  .  Pes anserine bursitis 01/20/2011  . Edema 08/21/2010  . Nonallopathic lesion of lumbar region 07/04/2010  . Obesity, unspecified 03/27/2010  . DEPRESSION, MAJOR, MILD 03/27/2010  . Chronic pain syndrome 03/27/2010  . NONALLOPATHIC LESION OF THORACIC REGION NEC 03/27/2010  . FATIGUE 03/27/2010  . HEADACHE 03/27/2010    Hadasah Brugger 09/20/2020, 3:46 PM  [facility] [address] [place], [place], [place] Phone: [phone]   Fax:  [phone]  Name: [name] MRN: [name] Date of Birth: 08/20/1975  10/18/1975, PT 09/20/20 3:46 PM Phone: [phone] Fax: [phone]

## 2020-09-24 ENCOUNTER — Ambulatory Visit: Payer: Medicare Other | Admitting: Physical Therapy

## 2020-09-25 ENCOUNTER — Other Ambulatory Visit: Payer: Self-pay

## 2020-09-25 ENCOUNTER — Emergency Department (HOSPITAL_COMMUNITY): Payer: Medicare Other

## 2020-09-25 ENCOUNTER — Encounter (HOSPITAL_COMMUNITY): Payer: Self-pay

## 2020-09-25 ENCOUNTER — Emergency Department (HOSPITAL_COMMUNITY)
Admission: EM | Admit: 2020-09-25 | Discharge: 2020-09-25 | Disposition: A | Discharge status: Home / Self Care | Payer: Medicare Other | Attending: Emergency Medicine | Admitting: Emergency Medicine

## 2020-09-25 DIAGNOSIS — Z79899 Other long term (current) drug therapy: Secondary | ICD-10-CM | POA: Diagnosis not present

## 2020-09-25 DIAGNOSIS — Y92410 Unspecified street and highway as the place of occurrence of the external cause: Secondary | ICD-10-CM | POA: Insufficient documentation

## 2020-09-25 DIAGNOSIS — R1013 Epigastric pain: Secondary | ICD-10-CM | POA: Diagnosis not present

## 2020-09-25 DIAGNOSIS — R110 Nausea: Secondary | ICD-10-CM | POA: Diagnosis not present

## 2020-09-25 DIAGNOSIS — R1084 Generalized abdominal pain: Secondary | ICD-10-CM | POA: Diagnosis present

## 2020-09-25 DIAGNOSIS — R079 Chest pain, unspecified: Secondary | ICD-10-CM

## 2020-09-25 LAB — COMPREHENSIVE METABOLIC PANEL
ALT: 13 U/L (ref 0–44)
AST: 14 U/L — ABNORMAL LOW (ref 15–41)
Albumin: 3 g/dL — ABNORMAL LOW (ref 3.5–5.0)
Alkaline Phosphatase: 66 U/L (ref 38–126)
Anion gap: 7 (ref 5–15)
BUN: 8 mg/dL (ref 6–20)
CO2: 24 mmol/L (ref 22–32)
Calcium: 8.7 mg/dL — ABNORMAL LOW (ref 8.9–10.3)
Chloride: 105 mmol/L (ref 98–111)
Creatinine, Ser: 0.68 mg/dL (ref 0.44–1.00)
GFR, Estimated: >60 mL/min (ref >60)
Glucose, Bld: 113 mg/dL — ABNORMAL HIGH (ref 70–99)
Potassium: 3.7 mmol/L (ref 3.5–5.1)
Sodium: 136 mmol/L (ref 135–145)
Total Bilirubin: 0.2 mg/dL — ABNORMAL LOW (ref 0.3–1.2)
Total Protein: 6.5 g/dL (ref 6.5–8.1)

## 2020-09-25 LAB — CBC WITH DIFFERENTIAL/PLATELET
Abs Immature Granulocytes: 0 10*3/uL (ref 0.00–0.07)
Basophils Absolute: 0.2 10*3/uL — ABNORMAL HIGH (ref 0.0–0.1)
Basophils Relative: 3 %
Eosinophils Absolute: 0.2 10*3/uL (ref 0.0–0.5)
Eosinophils Relative: 2 %
HCT: 39.8 % (ref 36.0–46.0)
Hemoglobin: 11.6 g/dL — ABNORMAL LOW (ref 12.0–15.0)
Lymphocytes Relative: 37 %
Lymphs Abs: 2.8 10*3/uL (ref 0.7–4.0)
MCH: 20.4 pg — ABNORMAL LOW (ref 26.0–34.0)
MCHC: 29.1 g/dL — ABNORMAL LOW (ref 30.0–36.0)
MCV: 69.9 fL — ABNORMAL LOW (ref 80.0–100.0)
Monocytes Absolute: 0.4 10*3/uL (ref 0.1–1.0)
Monocytes Relative: 5 %
Neutro Abs: 4 10*3/uL (ref 1.7–7.7)
Neutrophils Relative %: 53 %
Platelets: 220 10*3/uL (ref 150–400)
RBC: 5.69 MIL/uL — ABNORMAL HIGH (ref 3.87–5.11)
RDW: 18.1 % — ABNORMAL HIGH (ref 11.5–15.5)
WBC: 7.5 10*3/uL (ref 4.0–10.5)
nRBC: 0 % (ref 0.0–0.2)
nRBC: 0 /100 WBC

## 2020-09-25 LAB — URINALYSIS, ROUTINE W REFLEX MICROSCOPIC
Bilirubin Urine: NEGATIVE
Glucose, UA: NEGATIVE mg/dL
Hgb urine dipstick: NEGATIVE
Ketones, ur: NEGATIVE mg/dL
Leukocytes,Ua: NEGATIVE
Nitrite: NEGATIVE
Protein, ur: NEGATIVE mg/dL
Specific Gravity, Urine: 1.015 (ref 1.005–1.030)
pH: 6 (ref 5.0–8.0)

## 2020-09-25 LAB — I-STAT BETA HCG BLOOD, ED (MC, WL, AP ONLY): I-stat hCG, quantitative: <5 m[IU]/mL (ref <5)

## 2020-09-25 LAB — TROPONIN I (HIGH SENSITIVITY): Troponin I (High Sensitivity): <2 ng/L (ref <18)

## 2020-09-25 LAB — LIPASE, BLOOD: Lipase: 24 U/L (ref 11–51)

## 2020-09-25 MED ORDER — FENTANYL CITRATE (PF) 100 MCG/2ML IJ SOLN
50.0000 ug | Freq: Once | INTRAMUSCULAR | Status: AC
  Administered 2020-09-25: 50 ug via INTRAVENOUS
  Filled 2020-09-25: qty 2

## 2020-09-25 MED ORDER — IOHEXOL 300 MG/ML  SOLN
75.0000 mL | Freq: Once | INTRAMUSCULAR | Status: AC | PRN
  Administered 2020-09-25: 75 mL via INTRAVENOUS

## 2020-09-25 MED ORDER — ONDANSETRON HCL 4 MG/2ML IJ SOLN
4.0000 mg | Freq: Once | INTRAMUSCULAR | Status: AC
  Administered 2020-09-25: 4 mg via INTRAVENOUS
  Filled 2020-09-25: qty 2

## 2020-09-25 NOTE — ED Notes (Signed)
Pt and husband keep stating pt is anemic and needs something to eat/drink. Pt informed cannot have anything until CT scan but would ask. Notified RN, MD, MD approved something to drink. Pt given cranberry juice per MD order/approval.
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
## 2020-09-25 NOTE — ED Notes (Signed)
All appropriate discharge materials reviewed at length with patient. Time for questions provided. Pt has no other questions at this time and verbalizes understanding of all provided materials.  
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
## 2020-09-27 ENCOUNTER — Encounter: Payer: Medicare Other | Admitting: Physical Therapy

## 2020-09-28 ENCOUNTER — Other Ambulatory Visit: Payer: Self-pay | Admitting: Internal Medicine

## 2020-09-28 ENCOUNTER — Other Ambulatory Visit: Payer: Self-pay

## 2020-09-28 ENCOUNTER — Ambulatory Visit
Admission: RE | Admit: 2020-09-28 | Discharge: 2020-09-28 | Disposition: A | Discharge status: Home / Self Care | Payer: Medicare Other | Source: Ambulatory Visit | Attending: Internal Medicine | Admitting: Internal Medicine

## 2020-09-28 DIAGNOSIS — N6310 Unspecified lump in the right breast, unspecified quadrant: Secondary | ICD-10-CM

## 2020-09-28 DIAGNOSIS — R928 Other abnormal and inconclusive findings on diagnostic imaging of breast: Secondary | ICD-10-CM

## 2020-09-28 DIAGNOSIS — N6320 Unspecified lump in the left breast, unspecified quadrant: Secondary | ICD-10-CM

## 2020-10-12 ENCOUNTER — Ambulatory Visit: Payer: Medicare Other | Attending: Family Medicine | Admitting: Physical Therapy

## 2020-10-12 ENCOUNTER — Encounter: Payer: Self-pay | Admitting: Physical Therapy

## 2020-10-12 ENCOUNTER — Other Ambulatory Visit: Payer: Self-pay

## 2020-10-12 DIAGNOSIS — M6281 Muscle weakness (generalized): Secondary | ICD-10-CM | POA: Diagnosis present

## 2020-10-12 DIAGNOSIS — M5441 Lumbago with sciatica, right side: Secondary | ICD-10-CM | POA: Insufficient documentation

## 2020-10-12 DIAGNOSIS — M5440 Lumbago with sciatica, unspecified side: Secondary | ICD-10-CM | POA: Diagnosis present

## 2020-10-12 DIAGNOSIS — M25572 Pain in left ankle and joints of left foot: Secondary | ICD-10-CM | POA: Diagnosis present

## 2020-10-12 DIAGNOSIS — R252 Cramp and spasm: Secondary | ICD-10-CM | POA: Insufficient documentation

## 2020-10-12 DIAGNOSIS — M25511 Pain in right shoulder: Secondary | ICD-10-CM | POA: Insufficient documentation

## 2020-10-12 NOTE — Therapy (Signed)
[facility] [address] [place], [place], [address] Phone: [phone]   Fax:  [phone]  Physical Therapy Treatment/DIscharge  Patient Details  Name: [name] MRN: [id number] Date of Birth: Jul 01, 1975 Referring Provider (PT): Lavada Mesi MD   Encounter Date: 10/12/2020   PT End of Session - 10/12/20 1242    Visit Number 11    Number of Visits 18    Date for PT Re-Evaluation 10/06/20    Authorization Type [employer] MCR/MCD    PT Start Time 1145   15 min late   PT Stop Time 1226    PT Time Calculation (min) 41 min    Activity Tolerance Patient tolerated treatment well    Behavior During Therapy [facility] for tasks assessed/performed           Past Medical History:  Diagnosis Date  . ADHD   . Anemia   . Anxiety   . Arthralgia   . Bimalleolar ankle fracture 07/19/2016   left  . Bipolar disorder (HCC)   . Chronic pain    hands, knees, back  . Depression   . Migraines   . Obesity     Past Surgical History:  Procedure Laterality Date  . CERVICAL CONIZATION W/BX  10/30/2011   Procedure: CONIZATION CERVIX WITH BIOPSY;  Surgeon: Kathreen Cosier, MD;  Location: [facility];  Service: Gynecology;  Laterality: N/A;  . DILATION AND CURETTAGE OF UTERUS  10/30/2011   Procedure: DILATATION AND CURETTAGE;  Surgeon: Kathreen Cosier, MD;  Location: [facility];  Service: Gynecology;  Laterality: N/A;  . DILITATION & CURRETTAGE/HYSTROSCOPY WITH HYDROTHERMAL ABLATION N/A 12/14/2013   Procedure: DILATATION & CURETTAGE/HYSTEROSCOPY WITH HYDROTHERMAL ABLATION;  Surgeon: Kathreen Cosier, MD;  Location: [facility];  Service: Gynecology;  Laterality: N/A;  . ORIF ANKLE FRACTURE Left 07/23/2016   Procedure: OPEN REDUCTION INTERNAL FIXATION (ORIF) LEFT BIMALLEOLAR ANKLE FRACTURE;  Surgeon: Tarry Kos, MD;  Location: [facility];  Service: Orthopedics;  Laterality: Left;  . TUBAL LIGATION  07/12/2003  . WISDOM TOOTH EXTRACTION       There were no vitals filed for this visit.   Subjective Assessment - 10/12/20 1147    Subjective I have pain but I still work through it.  It felt like someone beat me with a hammer in my ankle. Hurting in back, shoulders and L ankle. Have been riding the bike about 15-20 min about everyday.              [facility] PT Assessment - 10/12/20 0001      AROM   Lumbar Flexion WNL    Lumbar Extension WNL    Lumbar - Right Side Bend L sided min pain    Lumbar - Left Side Bend WNL    Lumbar - Right Rotation WNL    Lumbar - Left Rotation WNL      Strength   Overall Strength Comments Rt shoulder flexion , abduction 4-/5, with pain      Transfers   Five time sit to stand comments  13.3 sec               [facility] Adult PT Treatment/Exercise - 10/12/20 0001      Self-Care   Other Self-Care Comments  options for exercise, walking in park, pool and HEP verbal review, FOTO score, DC      Lumbar Exercises: Stretches   Other Lumbar Stretch Exercise trunk ROM, used sink to demo flexion and tractioning  Lumbar Exercises: Aerobic   Nustep 8 min L5 UE and LE      Lumbar Exercises: Machines for Strengthening   Other Lumbar Machine Exercise lat pull 4 plates x 15 standing and then row x 15   25 lbs then 35 lbes respectively              PT Short Term Goals - 10/12/20 1148      PT SHORT TERM GOAL #1   Title Pt will be independent with HEP initial    Status Achieved      PT SHORT TERM GOAL #2   Title Report pain decrease in back and shoulder from 10/10 to at least 6/10    Status Achieved      PT SHORT TERM GOAL #3   Title Demonstrate symmetry with gait on level surface with use of a cane or walker on the R    Baseline min limp    Status Partially Met      PT SHORT TERM GOAL #4   Title Pt will be able to perform 5 x STS and make LTG and to increase ease of rising to and from toilet    Status Achieved             PT Long Term Goals - 10/12/20 1150      PT LONG TERM  GOAL #1   Title Pt will be independent with all advanced HEP given    Status Achieved      PT LONG TERM GOAL #2   Title FOTO will improve for back  from 1%   to38%  and shoulder from 41 to 66%     indicating improved functional mobility.    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    Status Partially Met      PT LONG TERM GOAL #5   Title Bil shoulder IR and ER will return to [facility] to return for decreased pain from 3/10 or less  ADLs such as dressing and grooming.    Baseline limited to touching side of head , neck , painful    Status Not Met                 Plan - 10/12/20 1204    Clinical Impression Statement Patient has been out of PT for 3 weeks.  She is making strides towards increasing her physical activity. Overall her back pain is much improved. She cont to have weakness and pain in bilateral shoulders limiting her ADLs and housework. She will be discharged today , encourasged to keep consistent and then if she needs to come back at a later date with a new referral.    PT Treatment/Interventions ADLs/Self Care Home Management;Cryotherapy;Electrical Stimulation;Aquatic Therapy;Iontophoresis 4mg /ml Dexamethasone;Moist Heat;Traction;Ultrasound;Gait training;Stair training;Functional mobility training;Therapeutic activities;Therapeutic exercise;Balance training;Neuromuscular re-education;Manual techniques;Patient/family education;Passive range of motion;Dry needling;Taping;Joint Manipulations;Spinal Manipulations    PT Next Visit Plan combo UE and LE conditioning, standing core, IFC    PT Home Exercise Plan [id number]  Consulted and  Agree with Plan of Care Patient           Patient will benefit from skilled therapeutic intervention in order to improve the following deficits and impairments:  Abnormal gait,Decreased activity tolerance,Decreased mobility,Decreased knowledge of precautions,Decreased knowledge of use of DME,Decreased range of motion,Decreased strength,Difficulty walking,Increased edema,Increased muscle spasms,Impaired perceived functional ability,Impaired sensation,Postural dysfunction,Obesity,Pain,Improper body mechanics,Impaired UE functional use  Visit Diagnosis: Right shoulder pain, unspecified chronicity  Low back pain with sciatica, sciatica laterality unspecified, unspecified back pain laterality, unspecified chronicity  Cramp and spasm  Muscle weakness (generalized)  Acute right-sided low back pain with right-sided sciatica  Pain in left ankle and joints of left foot     Problem List Patient Active Problem List   Diagnosis Date Noted  . Acute pain of left knee 11/09/2019  . Closed fracture of fifth toe of left foot 11/09/2019  . Displaced bimalleolar fracture of left lower leg, subsequent encounter for closed fracture with delayed healing   . Closed high lateral malleolus fracture, left, initial encounter   . Anemia, unspecified 02/15/2013  . Depression 12/09/2012  . Iron deficiency anemia 08/18/2011  . Vitamin D deficiency 08/18/2011  . Anxiety 06/02/2011  . Elevated blood pressure reading without diagnosis of hypertension 06/02/2011  . Joint pain 06/02/2011  . Migraine headache 06/02/2011  . Pes anserine bursitis 01/20/2011  . Edema 08/21/2010  . Nonallopathic lesion of lumbar region 07/04/2010  . Obesity, unspecified 03/27/2010  . DEPRESSION, MAJOR, MILD 03/27/2010  . Chronic pain syndrome 03/27/2010  . NONALLOPATHIC LESION OF THORACIC REGION NEC 03/27/2010  . FATIGUE 03/27/2010  . HEADACHE 03/27/2010    Loel Betancur 10/12/2020, 12:45 PM  [facility] [address] [place], [place], [address] Phone: [phone]   Fax:  [phone]  Name: [name] MRN: [id number] Date of Birth: December 25, 1975  PHYSICAL THERAPY DISCHARGE SUMMARY  Visits from Start of Care: 11  Current functional level related to goals / functional outcomes: See above for most recent   Remaining deficits: Shoulder ROM, strength, endurance, pain    Education / Equipment: HEP, self care, extensive   Plan: Patient agrees to discharge.  Patient goals were partially met. Patient is being discharged due to meeting the stated rehab goals.  ?????    Plateau of progress, met goals.   Raeford Razor, PT 10/12/20 12:46 PM Phone: [phone] Fax: [phone]

## 2020-10-19 ENCOUNTER — Other Ambulatory Visit: Payer: Self-pay

## 2020-10-19 ENCOUNTER — Ambulatory Visit (INDEPENDENT_AMBULATORY_CARE_PROVIDER_SITE_OTHER): Payer: Medicare Other | Admitting: Family Medicine

## 2020-10-19 DIAGNOSIS — M25512 Pain in left shoulder: Secondary | ICD-10-CM

## 2020-10-19 DIAGNOSIS — M25572 Pain in left ankle and joints of left foot: Secondary | ICD-10-CM | POA: Diagnosis not present

## 2020-10-19 NOTE — Patient Instructions (Signed)
    Schedule consult with Dr. Lajoyce Corners for left ankle.

## 2020-10-19 NOTE — Progress Notes (Signed)
Office Visit Note   Patient: [name]           Date of Birth: 1975/11/14           MRN: [id number] Visit Date: 10/19/2020 Requested by: Fleet Contras, MD [address] [place],  [place] [address] PCP: Fleet Contras, MD  Subjective: Chief Complaint  Patient presents with  . Left Shoulder - Pain    Has finished with PT for the shoulder. Persistent pain.  . Left Foot - Pain    Persistent pain from the fall in January. Hurts to bear weight. Wakes her from sleep. She stopped wearing the boot. It did help, but she did not know if there is still good reason to wear it.     HPI: She is here for follow-up left shoulder and ankle pain.  It has been about 5 months since she fell.  She finished physical therapy but it did not help her shoulder or her foot.  She tried wearing her fracture boot but that did not help either.  The foot/ankle bothers her the most, it is very painful to bear weight and it wakes her from sleep.  Pain is across the anterior ankle.  Her shoulder hurts when reaching overhead or behind her back and it hurts mostly on the posterior lateral aspect.                ROS:   All other systems were reviewed and are negative.  Objective: Vital Signs: There were no vitals taken for this visit.  Physical Exam:  General:  Alert and oriented, in no acute distress. Pulm:  Breathing unlabored. Psy:  Normal mood, congruent affect  Left shoulder: She has slight stiffness with overhead reach/external rotation.  Rotator cuff strength is 5/5 throughout.  She is tender in the posterior subacromial space. Left ankle: Slight swelling of the ankle joint, no erythema or warmth.  Diffusely tender across the anterior ankle joint.  Ligaments feel stable.  No significant forefoot tenderness.   Imaging: No results found.  Assessment & Plan: 1.  2-month status post fall with persistent left shoulder pain, suspect impingement but cannot rule out partial rotator cuff tear. - MRI to  further evaluate.  2.  Persistent left ankle pain 26-month status post fall -ER options including injection versus surgical consult.  She would like to consult with Dr. Lajoyce Corners.     Procedures: No procedures performed        PMFS History: Patient Active Problem List   Diagnosis Date Noted  . Acute pain of left knee 11/09/2019  . Closed fracture of fifth toe of left foot 11/09/2019  . Displaced bimalleolar fracture of left lower leg, subsequent encounter for closed fracture with delayed healing   . Closed high lateral malleolus fracture, left, initial encounter   . Anemia, unspecified 02/15/2013  . Depression 12/09/2012  . Iron deficiency anemia 08/18/2011  . Vitamin D deficiency 08/18/2011  . Anxiety 06/02/2011  . Elevated blood pressure reading without diagnosis of hypertension 06/02/2011  . Joint pain 06/02/2011  . Migraine headache 06/02/2011  . Pes anserine bursitis 01/20/2011  . Edema 08/21/2010  . Nonallopathic lesion of lumbar region 07/04/2010  . Obesity, unspecified 03/27/2010  . DEPRESSION, MAJOR, MILD 03/27/2010  . Chronic pain syndrome 03/27/2010  . NONALLOPATHIC LESION OF THORACIC REGION NEC 03/27/2010  . FATIGUE 03/27/2010  . HEADACHE 03/27/2010   Past Medical History:  Diagnosis Date  . ADHD   . Anemia   . Anxiety   .  Arthralgia   . Bimalleolar ankle fracture 07/19/2016   left  . Bipolar disorder (HCC)   . Chronic pain    hands, knees, back  . Depression   . Migraines   . Obesity     Family History  Problem Relation Age of Onset  . Lupus Sister   . Diabetes type II Other   . Stroke Other     Past Surgical History:  Procedure Laterality Date  . CERVICAL CONIZATION W/BX  10/30/2011   Procedure: CONIZATION CERVIX WITH BIOPSY;  Surgeon: Kathreen Cosier, MD;  Location: [facility];  Service: Gynecology;  Laterality: N/A;  . DILATION AND CURETTAGE OF UTERUS  10/30/2011   Procedure: DILATATION AND CURETTAGE;  Surgeon: Kathreen Cosier, MD;  Location:  [facility];  Service: Gynecology;  Laterality: N/A;  . DILITATION & CURRETTAGE/HYSTROSCOPY WITH HYDROTHERMAL ABLATION N/A 12/14/2013   Procedure: DILATATION & CURETTAGE/HYSTEROSCOPY WITH HYDROTHERMAL ABLATION;  Surgeon: Kathreen Cosier, MD;  Location: [facility];  Service: Gynecology;  Laterality: N/A;  . ORIF ANKLE FRACTURE Left 07/23/2016   Procedure: OPEN REDUCTION INTERNAL FIXATION (ORIF) LEFT BIMALLEOLAR ANKLE FRACTURE;  Surgeon: Tarry Kos, MD;  Location: [facility];  Service: Orthopedics;  Laterality: Left;  . TUBAL LIGATION  07/12/2003  . WISDOM TOOTH EXTRACTION     Social History   Occupational History  . Not on file  Tobacco Use  . Smoking status: Never Smoker  . Smokeless tobacco: Never Used  Vaping Use  . Vaping Use: Never used  Substance and Sexual Activity  . Alcohol use: No  . Drug use: No  . Sexual activity: Not Currently    Partners: Male    Birth control/protection: Surgical

## 2020-10-29 ENCOUNTER — Ambulatory Visit: Payer: Medicare Other | Admitting: Orthopedic Surgery

## 2020-12-23 ENCOUNTER — Other Ambulatory Visit: Payer: Self-pay | Admitting: Family Medicine

## 2021-04-01 ENCOUNTER — Ambulatory Visit
Admission: RE | Admit: 2021-04-01 | Discharge: 2021-04-01 | Disposition: A | Discharge status: Home / Self Care | Payer: Medicare Other | Source: Ambulatory Visit | Attending: Internal Medicine | Admitting: Internal Medicine

## 2021-04-01 ENCOUNTER — Other Ambulatory Visit: Payer: Self-pay | Admitting: Internal Medicine

## 2021-04-01 ENCOUNTER — Other Ambulatory Visit: Payer: Self-pay

## 2021-04-01 DIAGNOSIS — N6320 Unspecified lump in the left breast, unspecified quadrant: Secondary | ICD-10-CM

## 2021-06-27 ENCOUNTER — Emergency Department (HOSPITAL_COMMUNITY): Payer: Medicare Other

## 2021-06-27 ENCOUNTER — Emergency Department (HOSPITAL_COMMUNITY)
Admission: EM | Admit: 2021-06-27 | Discharge: 2021-06-27 | Disposition: A | Discharge status: Home / Self Care | Payer: Medicare Other | Attending: Emergency Medicine | Admitting: Emergency Medicine

## 2021-06-27 DIAGNOSIS — M545 Low back pain, unspecified: Secondary | ICD-10-CM | POA: Insufficient documentation

## 2021-06-27 DIAGNOSIS — Y92410 Unspecified street and highway as the place of occurrence of the external cause: Secondary | ICD-10-CM | POA: Diagnosis not present

## 2021-06-27 DIAGNOSIS — M25531 Pain in right wrist: Secondary | ICD-10-CM | POA: Insufficient documentation

## 2021-06-27 DIAGNOSIS — R519 Headache, unspecified: Secondary | ICD-10-CM | POA: Diagnosis not present

## 2021-06-27 DIAGNOSIS — S63501A Unspecified sprain of right wrist, initial encounter: Secondary | ICD-10-CM

## 2021-06-27 DIAGNOSIS — S6991XA Unspecified injury of right wrist, hand and finger(s), initial encounter: Secondary | ICD-10-CM | POA: Diagnosis present

## 2021-06-27 DIAGNOSIS — R1030 Lower abdominal pain, unspecified: Secondary | ICD-10-CM | POA: Diagnosis not present

## 2021-06-27 LAB — CBC
HCT: 38.1 % (ref 36.0–46.0)
Hemoglobin: 11.6 g/dL — ABNORMAL LOW (ref 12.0–15.0)
MCH: 20.9 pg — ABNORMAL LOW (ref 26.0–34.0)
MCHC: 30.4 g/dL (ref 30.0–36.0)
MCV: 68.6 fL — ABNORMAL LOW (ref 80.0–100.0)
Platelets: 233 10*3/uL (ref 150–400)
RBC: 5.55 MIL/uL — ABNORMAL HIGH (ref 3.87–5.11)
RDW: 16.5 % — ABNORMAL HIGH (ref 11.5–15.5)
WBC: 7.6 10*3/uL (ref 4.0–10.5)
nRBC: 0 % (ref 0.0–0.2)

## 2021-06-27 LAB — COMPREHENSIVE METABOLIC PANEL
ALT: 12 U/L (ref 0–44)
AST: 15 U/L (ref 15–41)
Albumin: 3.1 g/dL — ABNORMAL LOW (ref 3.5–5.0)
Alkaline Phosphatase: 56 U/L (ref 38–126)
Anion gap: 8 (ref 5–15)
BUN: 10 mg/dL (ref 6–20)
CO2: 25 mmol/L (ref 22–32)
Calcium: 9 mg/dL (ref 8.9–10.3)
Chloride: 105 mmol/L (ref 98–111)
Creatinine, Ser: 0.8 mg/dL (ref 0.44–1.00)
GFR, Estimated: >60 mL/min (ref >60)
Glucose, Bld: 112 mg/dL — ABNORMAL HIGH (ref 70–99)
Potassium: 3.6 mmol/L (ref 3.5–5.1)
Sodium: 138 mmol/L (ref 135–145)
Total Bilirubin: 0.5 mg/dL (ref 0.3–1.2)
Total Protein: 6.8 g/dL (ref 6.5–8.1)

## 2021-06-27 LAB — I-STAT BETA HCG BLOOD, ED (MC, WL, AP ONLY): I-stat hCG, quantitative: <5 m[IU]/mL (ref <5)

## 2021-06-27 LAB — LIPASE, BLOOD: Lipase: 28 U/L (ref 11–51)

## 2021-06-27 MED ORDER — IOHEXOL 350 MG/ML SOLN
100.0000 mL | Freq: Once | INTRAVENOUS | Status: AC | PRN
  Administered 2021-06-27: 100 mL via INTRAVENOUS

## 2021-06-27 MED ORDER — ACETAMINOPHEN 500 MG PO TABS
1000.0000 mg | ORAL_TABLET | Freq: Once | ORAL | Status: AC
  Administered 2021-06-27: 1000 mg via ORAL
  Filled 2021-06-27: qty 2

## 2021-06-27 MED ORDER — OXYCODONE HCL 5 MG PO TABS
5.0000 mg | ORAL_TABLET | Freq: Once | ORAL | Status: AC
Start: 2021-06-27 — End: 2021-06-27
  Administered 2021-06-27: 5 mg via ORAL
  Filled 2021-06-27: qty 1
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
## 2021-06-27 NOTE — ED Notes (Signed)
Wrist Brace applied right wrist, patient tolerated well.
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
## 2021-09-30 ENCOUNTER — Ambulatory Visit
Admission: RE | Admit: 2021-09-30 | Discharge: 2021-09-30 | Disposition: A | Discharge status: Home / Self Care | Payer: Medicare Other | Source: Ambulatory Visit | Attending: Internal Medicine | Admitting: Internal Medicine

## 2021-09-30 DIAGNOSIS — N6320 Unspecified lump in the left breast, unspecified quadrant: Secondary | ICD-10-CM

## 2021-10-14 ENCOUNTER — Emergency Department (HOSPITAL_BASED_OUTPATIENT_CLINIC_OR_DEPARTMENT_OTHER): Payer: Medicare Other

## 2021-10-14 ENCOUNTER — Emergency Department (HOSPITAL_BASED_OUTPATIENT_CLINIC_OR_DEPARTMENT_OTHER)
Admission: EM | Admit: 2021-10-14 | Discharge: 2021-10-14 | Disposition: A | Discharge status: Home / Self Care | Payer: Medicare Other | Attending: Emergency Medicine | Admitting: Emergency Medicine

## 2021-10-14 ENCOUNTER — Encounter (HOSPITAL_BASED_OUTPATIENT_CLINIC_OR_DEPARTMENT_OTHER): Payer: Self-pay

## 2021-10-14 ENCOUNTER — Other Ambulatory Visit: Payer: Self-pay

## 2021-10-14 DIAGNOSIS — R079 Chest pain, unspecified: Secondary | ICD-10-CM | POA: Insufficient documentation

## 2021-10-14 DIAGNOSIS — R1110 Vomiting, unspecified: Secondary | ICD-10-CM | POA: Insufficient documentation

## 2021-10-14 DIAGNOSIS — R109 Unspecified abdominal pain: Secondary | ICD-10-CM | POA: Insufficient documentation

## 2021-10-14 DIAGNOSIS — M542 Cervicalgia: Secondary | ICD-10-CM | POA: Diagnosis not present

## 2021-10-14 DIAGNOSIS — R519 Headache, unspecified: Secondary | ICD-10-CM | POA: Diagnosis present

## 2021-10-14 DIAGNOSIS — Y92410 Unspecified street and highway as the place of occurrence of the external cause: Secondary | ICD-10-CM | POA: Diagnosis not present

## 2021-10-14 LAB — COMPREHENSIVE METABOLIC PANEL
ALT: 8 U/L (ref 0–44)
AST: 9 U/L — ABNORMAL LOW (ref 15–41)
Albumin: 3.4 g/dL — ABNORMAL LOW (ref 3.5–5.0)
Alkaline Phosphatase: 52 U/L (ref 38–126)
Anion gap: 8 (ref 5–15)
BUN: 12 mg/dL (ref 6–20)
CO2: 25 mmol/L (ref 22–32)
Calcium: 8.9 mg/dL (ref 8.9–10.3)
Chloride: 107 mmol/L (ref 98–111)
Creatinine, Ser: 0.63 mg/dL (ref 0.44–1.00)
GFR, Estimated: >60 mL/min (ref >60)
Glucose, Bld: 96 mg/dL (ref 70–99)
Potassium: 3.8 mmol/L (ref 3.5–5.1)
Sodium: 140 mmol/L (ref 135–145)
Total Bilirubin: 0.5 mg/dL (ref 0.3–1.2)
Total Protein: 6.9 g/dL (ref 6.5–8.1)

## 2021-10-14 LAB — CBC WITH DIFFERENTIAL/PLATELET
Abs Immature Granulocytes: 0.02 10*3/uL (ref 0.00–0.07)
Basophils Absolute: 0 10*3/uL (ref 0.0–0.1)
Basophils Relative: 0 %
Eosinophils Absolute: 0.1 10*3/uL (ref 0.0–0.5)
Eosinophils Relative: 1 %
HCT: 39.6 % (ref 36.0–46.0)
Hemoglobin: 11.8 g/dL — ABNORMAL LOW (ref 12.0–15.0)
Immature Granulocytes: 0 %
Lymphocytes Relative: 25 %
Lymphs Abs: 1.8 10*3/uL (ref 0.7–4.0)
MCH: 20.7 pg — ABNORMAL LOW (ref 26.0–34.0)
MCHC: 29.8 g/dL — ABNORMAL LOW (ref 30.0–36.0)
MCV: 69.5 fL — ABNORMAL LOW (ref 80.0–100.0)
Monocytes Absolute: 0.5 10*3/uL (ref 0.1–1.0)
Monocytes Relative: 6 %
Neutro Abs: 4.8 10*3/uL (ref 1.7–7.7)
Neutrophils Relative %: 68 %
Platelets: 188 10*3/uL (ref 150–400)
RBC: 5.7 MIL/uL — ABNORMAL HIGH (ref 3.87–5.11)
RDW: 17.5 % — ABNORMAL HIGH (ref 11.5–15.5)
Smear Review: NORMAL
WBC: 7.2 10*3/uL (ref 4.0–10.5)
nRBC: 0 % (ref 0.0–0.2)

## 2021-10-14 MED ORDER — PROCHLORPERAZINE EDISYLATE 10 MG/2ML IJ SOLN
10.0000 mg | Freq: Once | INTRAMUSCULAR | Status: AC
  Administered 2021-10-14: 10 mg via INTRAVENOUS
  Filled 2021-10-14: qty 2

## 2021-10-14 MED ORDER — KETOROLAC TROMETHAMINE 15 MG/ML IJ SOLN
15.0000 mg | Freq: Once | INTRAMUSCULAR | Status: AC
Start: 2021-10-14 — End: 2021-10-14
  Administered 2021-10-14: 15 mg via INTRAVENOUS
  Filled 2021-10-14: qty 1

## 2021-10-14 MED ORDER — IOHEXOL 300 MG/ML  SOLN
100.0000 mL | Freq: Once | INTRAMUSCULAR | Status: AC | PRN
  Administered 2021-10-14: 100 mL via INTRAVENOUS
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
## 2021-10-14 NOTE — ED Notes (Signed)
Patient verbalizes understanding of discharge instructions. Opportunity for questioning and answers were provided. Patient discharged from ED.  °

## 2021-10-14 NOTE — Discharge Instructions (Signed)
Call your primary care doctor or specialist as discussed in the next 2-3 days.   Return immediately back to the ER if:  Your symptoms worsen within the next 12-24 hours. You develop new symptoms such as new fevers, persistent vomiting, new pain, shortness of breath, or new weakness or numbness, or if you have any other concerns.  
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
## 2021-10-18 ENCOUNTER — Emergency Department (HOSPITAL_BASED_OUTPATIENT_CLINIC_OR_DEPARTMENT_OTHER): Payer: Medicare Other | Admitting: Radiology

## 2021-10-18 ENCOUNTER — Emergency Department (HOSPITAL_BASED_OUTPATIENT_CLINIC_OR_DEPARTMENT_OTHER): Payer: Medicare Other

## 2021-10-18 ENCOUNTER — Encounter (HOSPITAL_BASED_OUTPATIENT_CLINIC_OR_DEPARTMENT_OTHER): Payer: Self-pay

## 2021-10-18 ENCOUNTER — Emergency Department (HOSPITAL_BASED_OUTPATIENT_CLINIC_OR_DEPARTMENT_OTHER)
Admission: EM | Admit: 2021-10-18 | Discharge: 2021-10-18 | Disposition: A | Discharge status: Home / Self Care | Payer: Medicare Other | Attending: Emergency Medicine | Admitting: Emergency Medicine

## 2021-10-18 ENCOUNTER — Other Ambulatory Visit: Payer: Self-pay

## 2021-10-18 DIAGNOSIS — M25561 Pain in right knee: Secondary | ICD-10-CM | POA: Insufficient documentation

## 2021-10-18 DIAGNOSIS — R1011 Right upper quadrant pain: Secondary | ICD-10-CM | POA: Diagnosis not present

## 2021-10-18 DIAGNOSIS — R1031 Right lower quadrant pain: Secondary | ICD-10-CM | POA: Diagnosis not present

## 2021-10-18 DIAGNOSIS — S86911D Strain of unspecified muscle(s) and tendon(s) at lower leg level, right leg, subsequent encounter: Secondary | ICD-10-CM

## 2021-10-18 DIAGNOSIS — M25511 Pain in right shoulder: Secondary | ICD-10-CM | POA: Diagnosis not present

## 2021-10-18 DIAGNOSIS — S46919D Strain of unspecified muscle, fascia and tendon at shoulder and upper arm level, unspecified arm, subsequent encounter: Secondary | ICD-10-CM

## 2021-10-18 DIAGNOSIS — R1084 Generalized abdominal pain: Secondary | ICD-10-CM

## 2021-10-18 LAB — URINALYSIS, ROUTINE W REFLEX MICROSCOPIC
Bilirubin Urine: NEGATIVE
Glucose, UA: NEGATIVE mg/dL
Hgb urine dipstick: NEGATIVE
Ketones, ur: NEGATIVE mg/dL
Leukocytes,Ua: NEGATIVE
Nitrite: NEGATIVE
Protein, ur: NEGATIVE mg/dL
Specific Gravity, Urine: 1.009 (ref 1.005–1.030)
pH: 6.5 (ref 5.0–8.0)

## 2021-10-18 LAB — COMPREHENSIVE METABOLIC PANEL
ALT: 8 U/L (ref 0–44)
AST: 9 U/L — ABNORMAL LOW (ref 15–41)
Albumin: 3.6 g/dL (ref 3.5–5.0)
Alkaline Phosphatase: 59 U/L (ref 38–126)
Anion gap: 8 (ref 5–15)
BUN: 11 mg/dL (ref 6–20)
CO2: 26 mmol/L (ref 22–32)
Calcium: 9.1 mg/dL (ref 8.9–10.3)
Chloride: 104 mmol/L (ref 98–111)
Creatinine, Ser: 0.68 mg/dL (ref 0.44–1.00)
GFR, Estimated: >60 mL/min (ref >60)
Glucose, Bld: 83 mg/dL (ref 70–99)
Potassium: 3.5 mmol/L (ref 3.5–5.1)
Sodium: 138 mmol/L (ref 135–145)
Total Bilirubin: 0.6 mg/dL (ref 0.3–1.2)
Total Protein: 7.1 g/dL (ref 6.5–8.1)

## 2021-10-18 LAB — CBC WITH DIFFERENTIAL/PLATELET
Abs Immature Granulocytes: 0.02 10*3/uL (ref 0.00–0.07)
Basophils Absolute: 0 10*3/uL (ref 0.0–0.1)
Basophils Relative: 1 %
Eosinophils Absolute: 0.1 10*3/uL (ref 0.0–0.5)
Eosinophils Relative: 1 %
HCT: 39.9 % (ref 36.0–46.0)
Hemoglobin: 11.8 g/dL — ABNORMAL LOW (ref 12.0–15.0)
Immature Granulocytes: 0 %
Lymphocytes Relative: 31 %
Lymphs Abs: 2 10*3/uL (ref 0.7–4.0)
MCH: 20.5 pg — ABNORMAL LOW (ref 26.0–34.0)
MCHC: 29.6 g/dL — ABNORMAL LOW (ref 30.0–36.0)
MCV: 69.2 fL — ABNORMAL LOW (ref 80.0–100.0)
Monocytes Absolute: 0.5 10*3/uL (ref 0.1–1.0)
Monocytes Relative: 8 %
Neutro Abs: 3.8 10*3/uL (ref 1.7–7.7)
Neutrophils Relative %: 59 %
Platelets: 204 10*3/uL (ref 150–400)
RBC: 5.77 MIL/uL — ABNORMAL HIGH (ref 3.87–5.11)
RDW: 17.1 % — ABNORMAL HIGH (ref 11.5–15.5)
WBC: 6.3 10*3/uL (ref 4.0–10.5)
nRBC: 0 % (ref 0.0–0.2)

## 2021-10-18 LAB — PREGNANCY, URINE: Preg Test, Ur: NEGATIVE

## 2021-10-18 MED ORDER — FENTANYL CITRATE PF 50 MCG/ML IJ SOSY
50.0000 ug | PREFILLED_SYRINGE | Freq: Once | INTRAMUSCULAR | Status: AC
  Administered 2021-10-18: 50 ug via INTRAVENOUS
  Filled 2021-10-18: qty 1

## 2021-10-18 MED ORDER — IOHEXOL 300 MG/ML  SOLN
100.0000 mL | Freq: Once | INTRAMUSCULAR | Status: AC | PRN
  Administered 2021-10-18: 100 mL via INTRAVENOUS

## 2021-10-18 MED ORDER — METHOCARBAMOL 500 MG PO TABS: 500.0000 mg | ORAL_TABLET | Freq: Three times a day (TID) | ORAL | 0 refills | Status: DC | PRN
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
## 2021-10-18 NOTE — Discharge Instructions (Signed)
Take Tylenol Motrin for pain control.  Take Robaxin as needed for muscle cramps or spasms.  Come back to ER if you are having worsening pain, vomiting, fever or other new concerning symptoms.  Please follow-up with your primary care doctor.
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
## 2022-05-03 ENCOUNTER — Emergency Department (HOSPITAL_BASED_OUTPATIENT_CLINIC_OR_DEPARTMENT_OTHER)
Admission: EM | Admit: 2022-05-03 | Discharge: 2022-05-03 | Disposition: A | Discharge status: Home / Self Care | Payer: Medicare Other | Attending: Emergency Medicine | Admitting: Emergency Medicine

## 2022-05-03 ENCOUNTER — Other Ambulatory Visit: Payer: Self-pay

## 2022-05-03 ENCOUNTER — Emergency Department (HOSPITAL_BASED_OUTPATIENT_CLINIC_OR_DEPARTMENT_OTHER): Payer: Medicare Other

## 2022-05-03 ENCOUNTER — Encounter (HOSPITAL_BASED_OUTPATIENT_CLINIC_OR_DEPARTMENT_OTHER): Payer: Self-pay | Admitting: Emergency Medicine

## 2022-05-03 DIAGNOSIS — N939 Abnormal uterine and vaginal bleeding, unspecified: Secondary | ICD-10-CM | POA: Diagnosis present

## 2022-05-03 DIAGNOSIS — N858 Other specified noninflammatory disorders of uterus: Secondary | ICD-10-CM | POA: Diagnosis not present

## 2022-05-03 LAB — CBC WITH DIFFERENTIAL/PLATELET
Abs Immature Granulocytes: 0.03 10*3/uL (ref 0.00–0.07)
Basophils Absolute: 0.1 10*3/uL (ref 0.0–0.1)
Basophils Relative: 1 %
Eosinophils Absolute: 0 10*3/uL (ref 0.0–0.5)
Eosinophils Relative: 1 %
HCT: 40.9 % (ref 36.0–46.0)
Hemoglobin: 12.4 g/dL (ref 12.0–15.0)
Immature Granulocytes: 0 %
Lymphocytes Relative: 29 %
Lymphs Abs: 2.1 10*3/uL (ref 0.7–4.0)
MCH: 21.2 pg — ABNORMAL LOW (ref 26.0–34.0)
MCHC: 30.3 g/dL (ref 30.0–36.0)
MCV: 69.8 fL — ABNORMAL LOW (ref 80.0–100.0)
Monocytes Absolute: 0.6 10*3/uL (ref 0.1–1.0)
Monocytes Relative: 9 %
Neutro Abs: 4.4 10*3/uL (ref 1.7–7.7)
Neutrophils Relative %: 60 %
Platelets: 222 10*3/uL (ref 150–400)
RBC: 5.86 MIL/uL — ABNORMAL HIGH (ref 3.87–5.11)
RDW: 17.7 % — ABNORMAL HIGH (ref 11.5–15.5)
WBC: 7.2 10*3/uL (ref 4.0–10.5)
nRBC: 0 % (ref 0.0–0.2)

## 2022-05-03 LAB — BASIC METABOLIC PANEL
Anion gap: 8 (ref 5–15)
BUN: 17 mg/dL (ref 6–20)
CO2: 28 mmol/L (ref 22–32)
Calcium: 9.4 mg/dL (ref 8.9–10.3)
Chloride: 103 mmol/L (ref 98–111)
Creatinine, Ser: 0.62 mg/dL (ref 0.44–1.00)
GFR, Estimated: >60 mL/min (ref >60)
Glucose, Bld: 82 mg/dL (ref 70–99)
Potassium: 3.8 mmol/L (ref 3.5–5.1)
Sodium: 139 mmol/L (ref 135–145)

## 2022-05-03 LAB — HCG, QUANTITATIVE, PREGNANCY: hCG, Beta Chain, Quant, S: <1 m[IU]/mL (ref <5)

## 2022-05-03 NOTE — ED Notes (Signed)
Pt to ultrasound

## 2022-05-03 NOTE — ED Provider Notes (Signed)
[facility] Provider Note   CSN: [id number] Arrival date & time: 05/03/22  1059     History Chief Complaint  Patient presents with   Vaginal Bleeding    HPI [name] is a 46 y.o. female presenting for a week of intermittent vaginal bleeding.  She endorses dark red bleeding frequently.  Denies fevers or chills, nausea vomiting, syncope shortness of breath.  No known sick contacts. She is going through 4-6 pads per day.  She is ambulatory and denies any symptoms including fevers or chills, nausea or vomiting, syncope or shortness of breath.  Patient's recorded medical, surgical, social, medication list and allergies were reviewed in the Snapshot window as part of the initial history.   Review of Systems   Review of Systems  Constitutional:  Negative for chills and fever.  HENT:  Negative for ear pain and sore throat.   Eyes:  Negative for pain and visual disturbance.  Respiratory:  Negative for cough and shortness of breath.   Cardiovascular:  Negative for chest pain and palpitations.  Gastrointestinal:  Negative for abdominal pain and vomiting.  Genitourinary:  Positive for vaginal bleeding. Negative for dysuria, hematuria and vaginal discharge.  Musculoskeletal:  Negative for arthralgias and back pain.  Skin:  Negative for color change and rash.  Neurological:  Negative for seizures and syncope.  All other systems reviewed and are negative.   Physical Exam Updated Vital Signs BP 119/76 (BP Location: Right Arm)   Pulse 85   Temp 97.9 F (36.6 C) (Oral)   Resp 17   SpO2 100%  Physical Exam Vitals and nursing note reviewed.  Constitutional:      General: She is not in acute distress.    Appearance: She is well-developed.  HENT:     Head: Normocephalic and atraumatic.  Eyes:     Conjunctiva/sclera: Conjunctivae normal.  Cardiovascular:     Rate and Rhythm: Normal rate and regular rhythm.     Heart sounds: No murmur  heard. Pulmonary:     Effort: Pulmonary effort is normal. No respiratory distress.     Breath sounds: Normal breath sounds.  Abdominal:     General: There is no distension.     Palpations: Abdomen is soft.     Tenderness: There is no abdominal tenderness. There is no right CVA tenderness or left CVA tenderness.  Genitourinary:    Comments: Patient declined, planning to follow-up with GYN. Musculoskeletal:        General: No swelling or tenderness. Normal range of motion.     Cervical back: Neck supple.  Skin:    General: Skin is warm and dry.  Neurological:     General: No focal deficit present.     Mental Status: She is alert and oriented to person, place, and time. Mental status is at baseline.     Cranial Nerves: No cranial nerve deficit.      ED Course/ Medical Decision Making/ A&P    Procedures Procedures   Medications Ordered in ED Medications - No data to display  Medical Decision Making:    [name] is a 46 y.o. female who presented to the ED today with vaginal bleeding detailed above.     Patient's presentation is complicated by their history of Elevated BMI.  Patient placed on continuous vitals and telemetry monitoring while in ED which was reviewed periodically.   Complete initial physical exam performed, notably the patient  was hemodynamically stable in no acute distress.  Reviewed and confirmed nursing documentation for past medical history, family history, social history.    Initial Assessment:   With the patient's presentation of vaginal bleeding, most likely diagnosis is abnormal uterine bleeding. Other diagnoses were considered including (but not limited to) endometriosis, uterine fibroids, ovarian torsion, ectopic pregnancy. These are considered less likely due to history of present illness and physical exam findings.   This is most consistent with an acute life/limb threatening illness complicated by underlying chronic conditions.  Initial  Plan:  Transvaginal ultrasound to evaluate for intrapelvic mass or focal pathology Screening labs including CBC and Metabolic panel to evaluate for infectious or metabolic etiology of disease.  Objective evaluation as below reviewed with plan for close reassessment  Initial Study Results:   Laboratory  All laboratory results reviewed without evidence of clinically relevant pathology.     Radiology  All images reviewed independently. Agree with radiology report at this time.   US PELVIC COMPLETE WITH TRANSVAGINAL  Result Date: 05/03/2022 CLINICAL DATA:  1-1/2 week history of abnormal uterine bleeding. History of ablation EXAM: TRANSABDOMINAL AND TRANSVAGINAL ULTRASOUND OF PELVIS TECHNIQUE: Both transabdominal and transvaginal ultrasound examinations of the pelvis were performed. Transabdominal technique was performed for global imaging of the pelvis including uterus, ovaries, adnexal regions, and pelvic cul-de-sac. It was necessary to proceed with endovaginal exam following the transabdominal exam to visualize the endometrium and ovaries. COMPARISON:  CT abdomen and pelvis dated 10/18/2021 FINDINGS: Uterus Measurements: 8.0 cm in sagittal dimension. No fibroids or other mass visualized. Endometrium Thickness: 5 mm.  No focal abnormality visualized. Right ovary Not seen. Left ovary Not seen. Other findings:  No abnormal free fluid. IMPRESSION: 1. Atrophic endometrium. 2. Ovaries are not seen. Electronically Signed   By: Agustin Cree M.D.   On: 05/03/2022 14:05     Final Assessment and Plan:   Patient observed in emergency department for 3 and half hours with no evidence of hemodynamic instability.  Unfortunately images were limited by bowel gas.  Patient had no severe exacerbations of pain, is ambulatory tolerating p.o. intake. Given normal hemoglobin, no acute indication for further aggressive intervention in the emergency department.  She will need to be reassessed by her gynecologist in the  outpatient setting Limitations or imaging were discussed the patient shows understanding.  She will plan to return if her pain worsens or symptoms change. Disposition:  I have considered need for hospitalization, however, considering all of the above, I believe this patient is stable for discharge at this time.  Patient/family educated about specific return precautions for given chief complaint and symptoms.  Patient/family educated about follow-up with PCP.     Patient/family expressed understanding of return precautions and need for follow-up. Patient spoken to regarding all imaging and laboratory results and appropriate follow up for these results. All education provided in verbal form with additional information in written form. Time was allowed for answering of patient questions. Patient discharged.    Emergency Department Medication Summary:   Medications - No data to display       Clinical Impression:  1. Vaginal bleeding      Discharge   Final Clinical Impression(s) / ED Diagnoses Final diagnoses:  Vaginal bleeding    Rx / DC Orders ED Discharge Orders     None         Glyn Ade, MD 05/04/22 [phone]

## 2022-05-03 NOTE — ED Triage Notes (Addendum)
Pt presents to ED POV. Pt c/o vaginal bleeding x2w. Pt reports that  she began her menstrual cycle the weekend of thanksgiving. Pt reports 1w ago she thought that her cycle was ending bc bleeding became lighter but then it began to get heavy again. Pt reports feeling light headed and ha the past couple days. Pt reports some large clots. Pt denials using more than 1 pad/h. Pt reports tubal ligation and no BC use.

## 2022-05-03 NOTE — Discharge Instructions (Signed)
You were seen today for abnormal uterine bleeding.  Fortunately, your labs are grossly reassuring without evidence of anemia and your ultrasound has no findings of acute pathology.  However we could not see everything on your study so it is critical that you follow-up with your primary care provider for reassessment and work close with your obstetrician/gynecologist for further care and management.  Thank for the opportunity to participate in her care, Glyn Ade MD

## 2022-05-08 ENCOUNTER — Encounter: Payer: Self-pay | Admitting: Obstetrics

## 2022-05-08 ENCOUNTER — Ambulatory Visit (INDEPENDENT_AMBULATORY_CARE_PROVIDER_SITE_OTHER): Payer: Medicare Other | Admitting: Obstetrics

## 2022-05-08 ENCOUNTER — Other Ambulatory Visit (HOSPITAL_COMMUNITY)
Admission: RE | Admit: 2022-05-08 | Discharge: 2022-05-08 | Disposition: A | Discharge status: Home / Self Care | Payer: Medicare Other | Source: Ambulatory Visit | Attending: Obstetrics | Admitting: Obstetrics

## 2022-05-08 VITALS — BP 113/81 | HR 77 | Ht 66.0 in | Wt 296.9 lb

## 2022-05-08 DIAGNOSIS — N939 Abnormal uterine and vaginal bleeding, unspecified: Secondary | ICD-10-CM | POA: Diagnosis not present

## 2022-05-08 DIAGNOSIS — Z0000 Encounter for general adult medical examination without abnormal findings: Secondary | ICD-10-CM

## 2022-05-08 DIAGNOSIS — Z1151 Encounter for screening for human papillomavirus (HPV): Secondary | ICD-10-CM | POA: Diagnosis not present

## 2022-05-08 DIAGNOSIS — Z6841 Body Mass Index (BMI) 40.0 and over, adult: Secondary | ICD-10-CM

## 2022-05-08 DIAGNOSIS — Z01419 Encounter for gynecological examination (general) (routine) without abnormal findings: Secondary | ICD-10-CM | POA: Diagnosis present

## 2022-05-08 DIAGNOSIS — N898 Other specified noninflammatory disorders of vagina: Secondary | ICD-10-CM | POA: Diagnosis not present

## 2022-05-08 NOTE — Progress Notes (Signed)
Subjective:        [name] is a 46 y.o. female here for a routine exam.  Current complaints: Prolonged period.  Current period has been on for 3 weeks..    Personal health questionnaire:  Is patient Ashkenazi Jewish, have a family history of breast and/or ovarian cancer: no Is there a family history of uterine cancer diagnosed at age < 64, gastrointestinal cancer, urinary tract cancer, family member who is a Personnel officer syndrome-associated carrier: no Is the patient overweight and hypertensive, family history of diabetes, personal history of gestational diabetes, preeclampsia or PCOS: no Is patient over 76, have PCOS,  family history of premature CHD under age 46, diabetes, smoke, have hypertension or peripheral artery disease:  no At any time, has a partner hit, kicked or otherwise hurt or frightened you?: no Over the past 2 weeks, have you felt down, depressed or hopeless?: no Over the past 2 weeks, have you felt little interest or pleasure in doing things?:no   Gynecologic History Patient's last menstrual period was 04/21/2022. Contraception: tubal ligation Last Pap: 2022. Results were: normal Last mammogram: 2023. Results were: normal   US PELVIC COMPLETE WITH TRANSVAGINAL (Accession [id number]) (Order [id number]) Imaging Date: 05/03/2022 Department: [facility] Released By: Lyman Bishop R Authorizing: Glyn Ade, MD   Exam Status  Status  Final [99]   PACS Intelerad Image Link   Show images for US PELVIC COMPLETE WITH TRANSVAGINAL Study Result  Narrative & Impression  CLINICAL DATA:  1-1/2 week history of abnormal uterine bleeding. History of ablation   EXAM: TRANSABDOMINAL AND TRANSVAGINAL ULTRASOUND OF PELVIS   TECHNIQUE: Both transabdominal and transvaginal ultrasound examinations of the pelvis were performed. Transabdominal technique was performed for global imaging of the pelvis including uterus, ovaries,  adnexal regions, and pelvic cul-de-sac. It was necessary to proceed with endovaginal exam following the transabdominal exam to visualize the endometrium and ovaries.   COMPARISON:  CT abdomen and pelvis dated 10/18/2021   FINDINGS: Uterus   Measurements: 8.0 cm in sagittal dimension. No fibroids or other mass visualized.   Endometrium   Thickness: 5 mm.  No focal abnormality visualized.   Right ovary   Not seen.   Left ovary   Not seen.   Other findings:  No abnormal free fluid.   IMPRESSION: 1. Atrophic endometrium. 2. Ovaries are not seen.     Electronically Signed   By: Agustin Cree M.D.   On: 05/03/2022 14:05        Obstetric History OB History  Gravida Para Term Preterm AB Living  3 3 3     3   SAB IAB Ectopic Multiple Live Births               # Outcome Date GA Lbr Len/2nd Weight Sex Delivery Anes PTL Lv  3 Term 07/10/03   8 lb 14 oz (4.026 kg)  Vag-Spont     2 Term 09/16/00   9 lb 6 oz (4.252 kg)  Vag-Spont     1 Term 11/30/96   8 lb 13 oz (3.997 kg)  Vag-Spont       Past Medical History:  Diagnosis Date   ADHD    Anemia    Anxiety    Arthralgia    Bimalleolar ankle fracture 07/19/2016   left   Bipolar disorder (HCC)    Chronic pain    hands, knees, back   Depression    Migraines    Obesity  Past Surgical History:  Procedure Laterality Date   CERVICAL CONIZATION W/BX  10/30/2011   Procedure: CONIZATION CERVIX WITH BIOPSY;  Surgeon: Kathreen Cosier, MD;  Location: [facility];  Service: Gynecology;  Laterality: N/A;   DILATION AND CURETTAGE OF UTERUS  10/30/2011   Procedure: DILATATION AND CURETTAGE;  Surgeon: Kathreen Cosier, MD;  Location: [facility];  Service: Gynecology;  Laterality: N/A;   DILITATION & CURRETTAGE/HYSTROSCOPY WITH HYDROTHERMAL ABLATION N/A 12/14/2013   Procedure: DILATATION & CURETTAGE/HYSTEROSCOPY WITH HYDROTHERMAL ABLATION;  Surgeon: Kathreen Cosier, MD;  Location: [facility];  Service: Gynecology;  Laterality: N/A;   ORIF  ANKLE FRACTURE Left 07/23/2016   Procedure: OPEN REDUCTION INTERNAL FIXATION (ORIF) LEFT BIMALLEOLAR ANKLE FRACTURE;  Surgeon: Tarry Kos, MD;  Location: [facility];  Service: Orthopedics;  Laterality: Left;   TUBAL LIGATION  07/12/2003   WISDOM TOOTH EXTRACTION       Current Outpatient Medications:    amphetamine-dextroamphetamine (ADDERALL) 30 MG tablet, Take 1 tablet by mouth 2 (two) times daily., Disp: , Rfl:    celecoxib (CELEBREX) 200 MG capsule, TAKE 1 CAPSULE (200 MG TOTAL) BY MOUTH 2 (TWO) TIMES DAILY AS NEEDED., Disp: 60 capsule, Rfl: 6   citalopram (CELEXA) 40 MG tablet, Take 40 mg by mouth at bedtime., Disp: , Rfl: 1   Fe Cbn-Fe Gluc-FA-B12-C-DSS (FERRALET 90) 90-1 MG TABS, Take 1 tablet by mouth daily., Disp: , Rfl: 5   lidocaine (XYLOCAINE) 5 % ointment, Apply topically., Disp: , Rfl:    mirtazapine (REMERON) 30 MG tablet, Take 30 mg by mouth daily., Disp: , Rfl:    Oxycodone HCl 20 MG TABS, Take by mouth., Disp: , Rfl:    OZEMPIC, 2 MG/DOSE, 8 MG/3ML SOPN, Inject 2 mg into the skin once a week., Disp: , Rfl:    triamcinolone cream (KENALOG) 0.5 %, Apply topically., Disp: , Rfl:    baclofen (LIORESAL) 10 MG tablet, Take 1-2 tablets (10-20 mg total) by mouth 3 (three) times daily as needed for muscle spasms., Disp: 60 each, Rfl: 3   ibuprofen (ADVIL) 800 MG tablet, Take 800 mg by mouth 3 (three) times daily. (Patient not taking: Reported on 05/08/2022), Disp: , Rfl:    methocarbamol (ROBAXIN) 500 MG tablet, Take 1 tablet (500 mg total) by mouth 2 (two) times daily., Disp: 20 tablet, Rfl: 0   methocarbamol (ROBAXIN) 500 MG tablet, Take 1 tablet (500 mg total) by mouth every 8 (eight) hours as needed for muscle spasms., Disp: 20 tablet, Rfl: 0   naproxen sodium (ANAPROX DS) 550 MG tablet, Take 1 tablet (550 mg total) by mouth 2 (two) times daily with a meal. (Patient not taking: Reported on 10/19/2020), Disp: 50 tablet, Rfl: 1   topiramate (TOPAMAX) 100 MG tablet, Take  100 mg by mouth 2 (two) times daily., Disp: , Rfl:  Allergies  Allergen Reactions   Dilaudid [Hydromorphone] Nausea And Vomiting   Buspar [Buspirone] Other (See Comments)    "MADE ME REAL JITTERY"    Social History   Tobacco Use   Smoking status: Never   Smokeless tobacco: Never  Substance Use Topics   Alcohol use: No    Family History  Problem Relation Age of Onset   Lupus Sister    Diabetes type II Other    Stroke Other       Review of Systems  Constitutional: negative for fatigue and weight loss Respiratory: negative for cough and wheezing Cardiovascular: negative for chest pain, fatigue and palpitations Gastrointestinal:  negative for abdominal pain and change in bowel habits Musculoskeletal:negative for myalgias Neurological: negative for gait problems and tremors Behavioral/Psych: negative for abusive relationship, depression Endocrine: negative for temperature intolerance    Genitourinary: positive for abnormal uterine bleeding and vaginal discharge.  negative for abnormal menstrual periods, genital lesions, hot flashes, sexual problems  Integument/breast: negative for breast lump, breast tenderness, nipple discharge and skin lesion(s)    Objective:       BP 113/81   Pulse 77   Ht 5\' 6"  (1.676 m)   Wt 296 lb 14.4 oz (134.7 kg)   LMP 04/21/2022   BMI 47.92 kg/m  General:   Alert and no distress  Skin:   no rash or abnormalities  Lungs:   clear to auscultation bilaterally  Heart:   regular rate and rhythm, S1, S2 normal, no murmur, click, rub or gallop  Breasts:   normal without suspicious masses, skin or nipple changes or axillary nodes  Abdomen:  normal findings: no organomegaly, soft, non-tender and no hernia  Pelvis:  External genitalia: normal general appearance Urinary system: urethral meatus normal and bladder without fullness, nontender Vaginal: normal without tenderness, induration or masses Cervix: normal appearance Adnexa: normal bimanual  exam Uterus: anteverted and non-tender, normal size   Lab Review Urine pregnancy test Labs reviewed yes Radiologic studies reviewed yes  I have spent a total of 20 minutes of face-to-face time, excluding clinical staff time, reviewing notes and preparing to see patient, ordering tests and/or medications, and counseling the patient.   Assessment:     1. Encounter for gynecological examination with Papanicolaou smear of cervix Rx: - Cytology - PAP( [place])  2. Vaginal discharge Rx: - Cervicovaginal ancillary only  3. Abnormal uterine bleeding (AUB) --follow up for endometral biopsy  4. Class 3 severe obesity due to excess calories without serious comorbidity with body mass index (BMI) of 45.0 to 49.9 in adult (HCC) - weight reduction recommended  -   Plan:    Education reviewed: calcium supplements, depression evaluation, low fat, low cholesterol diet, safe sex/STD prevention, self breast exams, and weight bearing exercise. Follow up in: 2 weeks.    04/23/2022, MD 05/08/2022 3:53 PM

## 2022-05-08 NOTE — Progress Notes (Signed)
Patient presents for AUB. Patient states that her current cycle  has been on for almost 3 weeks. Changes pad every time she goes to the bathroom, but pads are not full. States about 2 full pads per day. U/S 05/03/22 Atrophic Endometrium  Last pap: 08/22/20 Normal Last MM: 09/30/21 Mass follow up in may of next year.

## 2022-05-09 LAB — CERVICOVAGINAL ANCILLARY ONLY
Bacterial Vaginitis (gardnerella): POSITIVE — AB
Candida Glabrata: NEGATIVE
Candida Vaginitis: NEGATIVE
Chlamydia: NEGATIVE
Comment: NEGATIVE
Comment: NEGATIVE
Comment: NEGATIVE
Comment: NEGATIVE
Comment: NEGATIVE
Comment: NORMAL
Neisseria Gonorrhea: NEGATIVE
Trichomonas: NEGATIVE

## 2022-05-12 ENCOUNTER — Other Ambulatory Visit: Payer: Self-pay | Admitting: Obstetrics

## 2022-05-12 DIAGNOSIS — B9689 Other specified bacterial agents as the cause of diseases classified elsewhere: Secondary | ICD-10-CM

## 2022-05-12 MED ORDER — METRONIDAZOLE 500 MG PO TABS: 500.0000 mg | ORAL_TABLET | Freq: Two times a day (BID) | ORAL | 0 refills | Status: DC

## 2022-05-13 ENCOUNTER — Telehealth: Payer: Self-pay | Admitting: Emergency Medicine

## 2022-05-13 LAB — CYTOLOGY - PAP
Comment: NEGATIVE
Diagnosis: NEGATIVE
High risk HPV: NEGATIVE

## 2022-05-13 NOTE — Telephone Encounter (Signed)
-----   Message from Brock Bad, MD sent at 05/12/2022  3:46 PM EST ----- Flagyl Rx for BV

## 2022-05-13 NOTE — Telephone Encounter (Signed)
Pt informed of results, Rx.   

## 2022-05-27 ENCOUNTER — Ambulatory Visit (INDEPENDENT_AMBULATORY_CARE_PROVIDER_SITE_OTHER): Payer: Medicare Other | Admitting: Obstetrics & Gynecology

## 2022-05-27 VITALS — BP 128/84 | HR 73 | Wt 294.0 lb

## 2022-05-27 DIAGNOSIS — N938 Other specified abnormal uterine and vaginal bleeding: Secondary | ICD-10-CM

## 2022-05-27 NOTE — Progress Notes (Signed)
Pt states she has not had any bleeding since last appointment.

## 2022-05-27 NOTE — Progress Notes (Signed)
Patient ID: [name], female   DOB: 24-Mar-1976, 47 y.o.   MRN: [id number]  Chief Complaint  Patient presents with   Follow-up    HPI [name] is a 47 y.o. female.  [id number] Patient's last menstrual period was 04/21/2022. Her last period resulted in 3 weeks of bleeding, which has resolved. Dr. Jodi Mourning scheduled her for possible biopsy. [place] was reviewed and atrophic endometrium was visualized.  HPI  Past Medical History:  Diagnosis Date   ADHD    Anemia    Anxiety    Arthralgia    Bimalleolar ankle fracture 07/19/2016   left   Bipolar disorder (HCC)    Chronic pain    hands, knees, back   Depression    Migraines    Obesity     Past Surgical History:  Procedure Laterality Date   CERVICAL CONIZATION W/BX  10/30/2011   Procedure: CONIZATION CERVIX WITH BIOPSY;  Surgeon: Frederico Hamman, MD;  Location: [facility];  Service: Gynecology;  Laterality: N/A;   DILATION AND CURETTAGE OF UTERUS  10/30/2011   Procedure: DILATATION AND CURETTAGE;  Surgeon: Frederico Hamman, MD;  Location: [facility];  Service: Gynecology;  Laterality: N/A;   DILITATION & CURRETTAGE/HYSTROSCOPY WITH HYDROTHERMAL ABLATION N/A 12/14/2013   Procedure: DILATATION & CURETTAGE/HYSTEROSCOPY WITH HYDROTHERMAL ABLATION;  Surgeon: Frederico Hamman, MD;  Location: [facility];  Service: Gynecology;  Laterality: N/A;   ORIF ANKLE FRACTURE Left 07/23/2016   Procedure: OPEN REDUCTION INTERNAL FIXATION (ORIF) LEFT BIMALLEOLAR ANKLE FRACTURE;  Surgeon: Leandrew Koyanagi, MD;  Location: [facility];  Service: Orthopedics;  Laterality: Left;   TUBAL LIGATION  07/12/2003   WISDOM TOOTH EXTRACTION      Family History  Problem Relation Age of Onset   Lupus Sister    Diabetes type II Other    Stroke Other     Social History Social History   Tobacco Use   Smoking status: Never   Smokeless tobacco: Never  Vaping Use   Vaping Use: Never used  Substance Use Topics   Alcohol use: No   Drug use: No     Allergies  Allergen Reactions   Dilaudid [Hydromorphone] Nausea And Vomiting   Buspar [Buspirone] Other (See Comments)    "MADE ME REAL JITTERY"    Current Outpatient Medications  Medication Sig Dispense Refill   amphetamine-dextroamphetamine (ADDERALL) 30 MG tablet Take 1 tablet by mouth 2 (two) times daily.     baclofen (LIORESAL) 10 MG tablet Take 1-2 tablets (10-20 mg total) by mouth 3 (three) times daily as needed for muscle spasms. 60 each 3   celecoxib (CELEBREX) 200 MG capsule TAKE 1 CAPSULE (200 MG TOTAL) BY MOUTH 2 (TWO) TIMES DAILY AS NEEDED. 60 capsule 6   citalopram (CELEXA) 40 MG tablet Take 40 mg by mouth at bedtime.  1   Fe Cbn-Fe Gluc-FA-B12-C-DSS (FERRALET 90) 90-1 MG TABS Take 1 tablet by mouth daily.  5   ibuprofen (ADVIL) 800 MG tablet Take 800 mg by mouth 3 (three) times daily. (Patient not taking: Reported on 05/08/2022)     lidocaine (XYLOCAINE) 5 % ointment Apply topically.     methocarbamol (ROBAXIN) 500 MG tablet Take 1 tablet (500 mg total) by mouth 2 (two) times daily. 20 tablet 0   methocarbamol (ROBAXIN) 500 MG tablet Take 1 tablet (500 mg total) by mouth every 8 (eight) hours as needed for muscle spasms. 20 tablet 0   metroNIDAZOLE (FLAGYL) 500 MG tablet Take 1 tablet (  500 mg total) by mouth 2 (two) times daily. 14 tablet 0   mirtazapine (REMERON) 30 MG tablet Take 30 mg by mouth daily.     naproxen sodium (ANAPROX DS) 550 MG tablet Take 1 tablet (550 mg total) by mouth 2 (two) times daily with a meal. (Patient not taking: Reported on 10/19/2020) 50 tablet 1   Oxycodone HCl 20 MG TABS Take by mouth.     OZEMPIC, 2 MG/DOSE, 8 MG/3ML SOPN Inject 2 mg into the skin once a week.     topiramate (TOPAMAX) 100 MG tablet Take 100 mg by mouth 2 (two) times daily.     triamcinolone cream (KENALOG) 0.5 % Apply topically.     No current facility-administered medications for this visit.    Review of Systems Review of Systems  Constitutional: Negative.    Respiratory: Negative.    Genitourinary:  Positive for menstrual problem. Negative for pelvic pain, vaginal bleeding and vaginal discharge.    Blood pressure 128/84, pulse 73, weight 294 lb (133.4 kg), last menstrual period 04/21/2022.  Physical Exam Physical Exam Vitals and nursing note reviewed.  Constitutional:      Appearance: She is obese. She is not ill-appearing.  Cardiovascular:     Rate and Rhythm: Normal rate.  Pulmonary:     Effort: Pulmonary effort is normal.  Musculoskeletal:     Cervical back: Normal range of motion.  Neurological:     General: No focal deficit present.     Mental Status: She is alert and oriented to person, place, and time.     Data Reviewed LINICAL DATA:  1-1/2 week history of abnormal uterine bleeding. History of ablation   EXAM: TRANSABDOMINAL AND TRANSVAGINAL ULTRASOUND OF PELVIS   TECHNIQUE: Both transabdominal and transvaginal ultrasound examinations of the pelvis were performed. Transabdominal technique was performed for global imaging of the pelvis including uterus, ovaries, adnexal regions, and pelvic cul-de-sac. It was necessary to proceed with endovaginal exam following the transabdominal exam to visualize the endometrium and ovaries.   COMPARISON:  CT abdomen and pelvis dated 10/18/2021   FINDINGS: Uterus   Measurements: 8.0 cm in sagittal dimension. No fibroids or other mass visualized.   Endometrium   Thickness: 5 mm.  No focal abnormality visualized.   Right ovary   Not seen.   Left ovary   Not seen.   Other findings:  No abnormal free fluid.   IMPRESSION: 1. Atrophic endometrium. 2. Ovaries are not seen.     Electronically Signed   By: Darrin Nipper M.D.   On: 05/03/2022 14:05        Assessment DUB likely anovulatory bleeding now resolved [place] is reassuring and biopsy is not needed  Plan Consider LGIUD for bleeding control Expectant management as she is perimenopausal     [name] 05/27/2022, 1:51 PM

## 2022-08-29 ENCOUNTER — Other Ambulatory Visit: Payer: Self-pay | Admitting: Internal Medicine

## 2022-08-30 LAB — CBC
HCT: 40.5 % (ref 35.0–45.0)
Hemoglobin: 12.1 g/dL (ref 11.7–15.5)
MCH: 21.3 pg — ABNORMAL LOW (ref 27.0–33.0)
MCHC: 29.9 g/dL — ABNORMAL LOW (ref 32.0–36.0)
MCV: 71.2 fL — ABNORMAL LOW (ref 80.0–100.0)
Platelets: 260 10*3/uL (ref 140–400)
RBC: 5.69 10*6/uL — ABNORMAL HIGH (ref 3.80–5.10)
RDW: 15.6 % — ABNORMAL HIGH (ref 11.0–15.0)
WBC: 6.3 10*3/uL (ref 3.8–10.8)

## 2022-08-30 LAB — LIPID PANEL
Cholesterol: 164 mg/dL (ref <200)
HDL: 44 mg/dL — ABNORMAL LOW (ref >=50)
LDL Cholesterol (Calc): 103 mg/dL (calc) — ABNORMAL HIGH
Non-HDL Cholesterol (Calc): 120 mg/dL (calc) (ref <130)
Total CHOL/HDL Ratio: 3.7 (calc) (ref <5.0)
Triglycerides: 84 mg/dL (ref <150)

## 2022-08-30 LAB — COMPLETE METABOLIC PANEL WITH GFR
AG Ratio: 1.1 (calc) (ref 1.0–2.5)
ALT: 11 U/L (ref 6–29)
AST: 11 U/L (ref 10–30)
Albumin: 3.5 g/dL — ABNORMAL LOW (ref 3.6–5.1)
Alkaline phosphatase (APISO): 71 U/L (ref 31–125)
BUN: 14 mg/dL (ref 7–25)
CO2: 22 mmol/L (ref 20–32)
Calcium: 8.7 mg/dL (ref 8.6–10.2)
Chloride: 108 mmol/L (ref 98–110)
Creat: 0.62 mg/dL (ref 0.50–0.99)
Globulin: 3.1 g/dL (calc) (ref 1.9–3.7)
Glucose, Bld: 75 mg/dL (ref 65–99)
Potassium: 4.3 mmol/L (ref 3.5–5.3)
Sodium: 141 mmol/L (ref 135–146)
Total Bilirubin: 0.4 mg/dL (ref 0.2–1.2)
Total Protein: 6.6 g/dL (ref 6.1–8.1)
eGFR: 113 mL/min/{1.73_m2} (ref >=60)

## 2022-08-30 LAB — VITAMIN D 25 HYDROXY (VIT D DEFICIENCY, FRACTURES): Vit D, 25-Hydroxy: 31 ng/mL (ref 30–100)

## 2022-08-30 LAB — TSH: TSH: 2.27 mIU/L

## 2022-12-26 ENCOUNTER — Emergency Department (HOSPITAL_BASED_OUTPATIENT_CLINIC_OR_DEPARTMENT_OTHER)
Admission: EM | Admit: 2022-12-26 | Discharge: 2022-12-26 | Disposition: A | Discharge status: Home / Self Care | Payer: 59 | Attending: Emergency Medicine | Admitting: Emergency Medicine

## 2022-12-26 ENCOUNTER — Encounter (HOSPITAL_BASED_OUTPATIENT_CLINIC_OR_DEPARTMENT_OTHER): Payer: Self-pay | Admitting: Emergency Medicine

## 2022-12-26 ENCOUNTER — Emergency Department (HOSPITAL_BASED_OUTPATIENT_CLINIC_OR_DEPARTMENT_OTHER): Payer: 59

## 2022-12-26 ENCOUNTER — Other Ambulatory Visit: Payer: Self-pay

## 2022-12-26 DIAGNOSIS — R519 Headache, unspecified: Secondary | ICD-10-CM | POA: Diagnosis not present

## 2022-12-26 DIAGNOSIS — R109 Unspecified abdominal pain: Secondary | ICD-10-CM | POA: Diagnosis present

## 2022-12-26 DIAGNOSIS — G8929 Other chronic pain: Secondary | ICD-10-CM | POA: Diagnosis not present

## 2022-12-26 DIAGNOSIS — J029 Acute pharyngitis, unspecified: Secondary | ICD-10-CM | POA: Insufficient documentation

## 2022-12-26 DIAGNOSIS — Z1152 Encounter for screening for COVID-19: Secondary | ICD-10-CM | POA: Diagnosis not present

## 2022-12-26 DIAGNOSIS — F319 Bipolar disorder, unspecified: Secondary | ICD-10-CM | POA: Insufficient documentation

## 2022-12-26 DIAGNOSIS — K8020 Calculus of gallbladder without cholecystitis without obstruction: Secondary | ICD-10-CM | POA: Diagnosis not present

## 2022-12-26 DIAGNOSIS — R197 Diarrhea, unspecified: Secondary | ICD-10-CM | POA: Diagnosis not present

## 2022-12-26 DIAGNOSIS — M545 Low back pain, unspecified: Secondary | ICD-10-CM

## 2022-12-26 LAB — CBC WITH DIFFERENTIAL/PLATELET
Abs Immature Granulocytes: 0.01 10*3/uL (ref 0.00–0.07)
Basophils Absolute: 0 10*3/uL (ref 0.0–0.1)
Basophils Relative: 0 %
Eosinophils Absolute: 0.1 10*3/uL (ref 0.0–0.5)
Eosinophils Relative: 1 %
HCT: 39.4 % (ref 36.0–46.0)
Hemoglobin: 12.1 g/dL (ref 12.0–15.0)
Immature Granulocytes: 0 %
Lymphocytes Relative: 20 %
Lymphs Abs: 0.9 10*3/uL (ref 0.7–4.0)
MCH: 21.6 pg — ABNORMAL LOW (ref 26.0–34.0)
MCHC: 30.7 g/dL (ref 30.0–36.0)
MCV: 70.5 fL — ABNORMAL LOW (ref 80.0–100.0)
Monocytes Absolute: 1 10*3/uL (ref 0.1–1.0)
Monocytes Relative: 22 %
Neutro Abs: 2.6 10*3/uL (ref 1.7–7.7)
Neutrophils Relative %: 57 %
Platelets: 186 10*3/uL (ref 150–400)
RBC: 5.59 MIL/uL — ABNORMAL HIGH (ref 3.87–5.11)
RDW: 15.9 % — ABNORMAL HIGH (ref 11.5–15.5)
WBC: 4.5 10*3/uL (ref 4.0–10.5)
nRBC: 0 % (ref 0.0–0.2)

## 2022-12-26 LAB — COMPREHENSIVE METABOLIC PANEL
ALT: 12 U/L (ref 0–44)
AST: 15 U/L (ref 15–41)
Albumin: 3.5 g/dL (ref 3.5–5.0)
Alkaline Phosphatase: 59 U/L (ref 38–126)
Anion gap: 6 (ref 5–15)
BUN: 7 mg/dL (ref 6–20)
CO2: 28 mmol/L (ref 22–32)
Calcium: 9.2 mg/dL (ref 8.9–10.3)
Chloride: 108 mmol/L (ref 98–111)
Creatinine, Ser: 0.66 mg/dL (ref 0.44–1.00)
GFR, Estimated: >60 mL/min (ref >60)
Glucose, Bld: 90 mg/dL (ref 70–99)
Potassium: 4.4 mmol/L (ref 3.5–5.1)
Sodium: 142 mmol/L (ref 135–145)
Total Bilirubin: 0.4 mg/dL (ref 0.3–1.2)
Total Protein: 6.7 g/dL (ref 6.5–8.1)

## 2022-12-26 LAB — URINALYSIS, ROUTINE W REFLEX MICROSCOPIC
Bacteria, UA: NONE SEEN
Bilirubin Urine: NEGATIVE
Glucose, UA: NEGATIVE mg/dL
Ketones, ur: NEGATIVE mg/dL
Leukocytes,Ua: NEGATIVE
Nitrite: NEGATIVE
Specific Gravity, Urine: 1.028 (ref 1.005–1.030)
pH: 6 (ref 5.0–8.0)

## 2022-12-26 LAB — GROUP A STREP BY PCR: Group A Strep by PCR: NOT DETECTED

## 2022-12-26 LAB — RESP PANEL BY RT-PCR (RSV, FLU A&B, COVID)  RVPGX2
Influenza A by PCR: NEGATIVE
Influenza B by PCR: NEGATIVE
Resp Syncytial Virus by PCR: NEGATIVE
SARS Coronavirus 2 by RT PCR: NEGATIVE

## 2022-12-26 LAB — LIPASE, BLOOD: Lipase: 10 U/L — ABNORMAL LOW (ref 11–51)

## 2022-12-26 LAB — HCG, SERUM, QUALITATIVE: Preg, Serum: NEGATIVE

## 2022-12-26 MED ORDER — LIDOCAINE VISCOUS HCL 2 % MT SOLN: 15.0000 mL | OROMUCOSAL | 0 refills | Status: DC | PRN

## 2022-12-26 MED ORDER — METHOCARBAMOL 500 MG PO TABS: 500.0000 mg | ORAL_TABLET | Freq: Two times a day (BID) | ORAL | 0 refills | Status: DC

## 2022-12-26 MED ORDER — ACETAMINOPHEN 325 MG PO TABS
650.0000 mg | ORAL_TABLET | Freq: Once | ORAL | Status: AC
  Administered 2022-12-26: 650 mg via ORAL
  Filled 2022-12-26: qty 2

## 2022-12-26 MED ORDER — LIDOCAINE VISCOUS HCL 2 % MT SOLN
15.0000 mL | Freq: Once | OROMUCOSAL | Status: AC
  Administered 2022-12-26: 15 mL via OROMUCOSAL
  Filled 2022-12-26: qty 15

## 2022-12-26 MED ORDER — LIDOCAINE 5 % EX PTCH
1.0000 | MEDICATED_PATCH | CUTANEOUS | Status: DC
  Administered 2022-12-26: 1 via TRANSDERMAL
  Filled 2022-12-26: qty 1

## 2022-12-26 NOTE — Discharge Instructions (Signed)
It was a pleasure taking care of you today.  As discussed, your tests are reassuring.  Your COVID and strep test were negative.  I suspect you have a viral infection causing your sore throat.  Your CT scan showed gallstones.  I suspect your back pain is likely related to muscular pain.  You may take over-the-counter ibuprofen or Tylenol as needed for pain.  Please follow-up with PCP early next week for recheck.  Return to the ER for any worsening symptoms.

## 2022-12-26 NOTE — ED Notes (Signed)
Patient provided with x2 warm blankets.

## 2022-12-26 NOTE — ED Notes (Addendum)
Pt unable to provide urine sample. Specimen cup at bedside.

## 2022-12-26 NOTE — ED Notes (Signed)
Pt provided with warm blankets.

## 2022-12-26 NOTE — ED Provider Notes (Signed)
[place] EMERGENCY DEPARTMENT AT [facility] Provider Note   CSN: [id number] Arrival date & time: 12/26/22  0815     History  Chief Complaint  Patient presents with   Sore Throat    [name] is a 47 y.o. female with a past medical history significant for chronic pain, bipolar disorder, depression, and obesity who presents to the ED due to sore throat x 2 days.  Patient states symptoms started with a tickle in her throat 2 mornings ago.  She then developed numerous episodes of nonbloody diarrhea, body aches, and a headache.  All symptoms have resolved except her sore throat.  Denies difficulty swallowing.  No changes to phonation.  No trismus.  Denies shortness of breath and chest pain.  No sick contacts.  No fever or chills.  Denies nausea and vomiting.  Patient also admits to right flank/low back pain that has been ongoing for the past few months.  Denies any injury.  Has a history of chronic low back pain.  She is concerned about a possible infection.  Denies any urinary or vaginal symptoms.  No rash to area. No history of kidney stones. Denies saddle anesthesia, bowel/bladder incontinence, lower extremity numbness/tingling, lower extremity weakness.  No history of cancer or IV drug use.  Denies fever and chills.  History obtained from patient and past medical records. No interpreter used during encounter.       Home Medications Prior to Admission medications   Medication Sig Start Date End Date Taking? Authorizing Provider  methocarbamol (ROBAXIN) 500 MG tablet Take 1 tablet (500 mg total) by mouth 2 (two) times daily. 12/26/22  Yes Ramiya Delahunty C, PA-C  amphetamine-dextroamphetamine (ADDERALL) 30 MG tablet Take 1 tablet by mouth 2 (two) times daily. 09/29/20   [provider]  baclofen (LIORESAL) 10 MG tablet Take 1-2 tablets (10-20 mg total) by mouth 3 (three) times daily as needed for muscle spasms. 05/28/20   Hilts, Casimiro Needle, MD  celecoxib (CELEBREX) 200 MG  capsule TAKE 1 CAPSULE (200 MG TOTAL) BY MOUTH 2 (TWO) TIMES DAILY AS NEEDED. 12/31/20   Hilts, Casimiro Needle, MD  citalopram (CELEXA) 40 MG tablet Take 40 mg by mouth at bedtime. 01/06/18   [provider]  Fe Cbn-Fe Gluc-FA-B12-C-DSS (FERRALET 90) 90-1 MG TABS Take 1 tablet by mouth daily. 06/08/16   [provider]  ibuprofen (ADVIL) 800 MG tablet Take 800 mg by mouth 3 (three) times daily. Patient not taking: Reported on 05/08/2022 03/30/20   [provider]  lidocaine (XYLOCAINE) 2 % solution Use as directed 15 mLs in the mouth or throat as needed for mouth pain. 12/26/22   Mannie Stabile, PA-C  lidocaine (XYLOCAINE) 5 % ointment Apply topically. 03/30/20   [provider]  methocarbamol (ROBAXIN) 500 MG tablet Take 1 tablet (500 mg total) by mouth 2 (two) times daily. 09/16/20   Gailen Shelter, PA  methocarbamol (ROBAXIN) 500 MG tablet Take 1 tablet (500 mg total) by mouth every 8 (eight) hours as needed for muscle spasms. 10/18/21   Milagros Loll, MD  metroNIDAZOLE (FLAGYL) 500 MG tablet Take 1 tablet (500 mg total) by mouth 2 (two) times daily. 05/12/22   Brock Bad, MD  mirtazapine (REMERON) 30 MG tablet Take 30 mg by mouth daily. 12/24/15   [provider]  naproxen sodium (ANAPROX DS) 550 MG tablet Take 1 tablet (550 mg total) by mouth 2 (two) times daily with a meal. Patient not taking: Reported on 10/19/2020  08/22/20   Adam Phenix, MD  Oxycodone HCl 20 MG TABS Take by mouth. 05/08/20   [provider]  OZEMPIC, 2 MG/DOSE, 8 MG/3ML SOPN Inject 2 mg into the skin once a week. 04/29/22   [provider]  topiramate (TOPAMAX) 100 MG tablet Take 100 mg by mouth 2 (two) times daily. 09/30/20   [provider]  triamcinolone cream (KENALOG) 0.5 % Apply topically. 06/01/20   [provider]      Allergies    Dilaudid [hydromorphone] and Buspar [buspirone]    Review of Systems   Review of Systems   Constitutional:  Negative for chills and fever.  HENT:  Positive for sore throat.   Respiratory:  Negative for shortness of breath.   Cardiovascular:  Negative for chest pain.  Gastrointestinal:  Positive for diarrhea (resolved). Negative for abdominal pain, nausea and vomiting.  Genitourinary:  Positive for flank pain. Negative for dysuria and vaginal discharge.  Musculoskeletal:  Positive for back pain.  Neurological:  Positive for headaches (resolved).    Physical Exam Updated Vital Signs BP (!) 114/91 (BP Location: Right Arm)   Pulse 86   Temp 98.3 F (36.8 C) (Oral)   Resp 19   Ht 5\' 6"  (1.676 m)   Wt 124.7 kg   LMP 12/12/2022   SpO2 100%   BMI 44.39 kg/m  Physical Exam Vitals and nursing note reviewed.  Constitutional:      General: She is not in acute distress.    Appearance: She is not ill-appearing.  HENT:     Head: Normocephalic.     Mouth/Throat:     Comments: Posterior oropharynx clear and mucous membranes moist, there is mild erythema but no edema or tonsillar exudates, uvula midline, normal phonation, no trismus, tolerating secretions without difficulty. Eyes:     Pupils: Pupils are equal, round, and reactive to light.  Cardiovascular:     Rate and Rhythm: Normal rate and regular rhythm.     Pulses: Normal pulses.     Heart sounds: Normal heart sounds. No murmur heard.    No friction rub. No gallop.  Pulmonary:     Effort: Pulmonary effort is normal.     Breath sounds: Normal breath sounds.  Abdominal:     General: Abdomen is flat. There is no distension.     Palpations: Abdomen is soft.     Tenderness: There is no abdominal tenderness. There is no guarding or rebound.     Comments: TTP throughout right flank region and low back.   Musculoskeletal:        General: Normal range of motion.     Cervical back: Neck supple.     Comments: Bilateral lower extremities neurovascularly intact  Skin:    General: Skin is warm and dry.  Neurological:      General: No focal deficit present.     Mental Status: She is alert.  Psychiatric:        Mood and Affect: Mood normal.        Behavior: Behavior normal.     ED Results / Procedures / Treatments   Labs (all labs ordered are listed, but only abnormal results are displayed) Labs Reviewed  CBC WITH DIFFERENTIAL/PLATELET - Abnormal; Notable for the following components:      Result Value   RBC 5.59 (*)    MCV 70.5 (*)    MCH 21.6 (*)    RDW 15.9 (*)    All other components within normal limits  LIPASE, BLOOD - Abnormal; Notable for the following components:   Lipase 10 (*)    All other components within normal limits  URINALYSIS, ROUTINE W REFLEX MICROSCOPIC - Abnormal; Notable for the following components:   Hgb urine dipstick LARGE (*)    Protein, ur TRACE (*)    All other components within normal limits  GROUP A STREP BY PCR  RESP PANEL BY RT-PCR (RSV, FLU A&B, COVID)  [id number]  COMPREHENSIVE METABOLIC PANEL  HCG, SERUM, QUALITATIVE    EKG None  Radiology CT Renal Stone Study  Result Date: 12/26/2022 CLINICAL DATA:  Right flank pain for 2 months. EXAM: CT ABDOMEN AND PELVIS WITHOUT CONTRAST TECHNIQUE: Multidetector CT imaging of the abdomen and pelvis was performed following the standard protocol without IV contrast. RADIATION DOSE REDUCTION: This exam was performed according to the departmental dose-optimization program which includes automated exposure control, adjustment of the mA and/or kV according to patient size and/or use of iterative reconstruction technique. COMPARISON:  Oct 18, 2021. FINDINGS: Lower chest: No acute abnormality. Hepatobiliary: Cholelithiasis is noted. No biliary dilatation. Liver is unremarkable on these unenhanced images. Pancreas: Unremarkable. No pancreatic ductal dilatation or surrounding inflammatory changes. Spleen: Low density seen in the spleen most consistent with hemangioma or cyst. Adrenals/Urinary Tract: Adrenal glands are unremarkable. Kidneys  are normal, without renal calculi, focal lesion, or hydronephrosis. Bladder is unremarkable. Stomach/Bowel: Stomach is within normal limits. Appendix appears normal. No evidence of bowel wall thickening, distention, or inflammatory changes. Vascular/Lymphatic: No significant vascular findings are present. No enlarged abdominal or pelvic lymph nodes. Reproductive: Uterus and bilateral adnexa are unremarkable. Other: No abdominal wall hernia or abnormality. No abdominopelvic ascites. Musculoskeletal: No acute or significant osseous findings. IMPRESSION: Cholelithiasis. No other significant abnormality seen in the abdomen or pelvis. Electronically Signed   By: Lupita Raider M.D.   On: 12/26/2022 11:51    Procedures Procedures    Medications Ordered in ED Medications  lidocaine (LIDODERM) 5 % 1 patch (1 patch Transdermal Patch Applied 12/26/22 0941)  lidocaine (XYLOCAINE) 2 % viscous mouth solution 15 mL (15 mLs Mouth/Throat Given 12/26/22 0941)  acetaminophen (TYLENOL) tablet 650 mg (650 mg Oral Given 12/26/22 0941)    ED Course/ Medical Decision Making/ A&P                                 Medical Decision Making Amount and/or Complexity of Data Reviewed Labs: ordered. Decision-making details documented in ED Course. Radiology: ordered and independent interpretation performed. Decision-making details documented in ED Course.  Risk OTC drugs. Prescription drug management.   This patient presents to the ED for concern of sore throat/flank pain, this involves an extensive number of treatment options, and is a complaint that carries with it a high risk of complications and morbidity.  The differential diagnosis includes COVID, strep, kidney stone, pyelonephritis, MSK etiology, etc  47 year old female presents to the ED due to sore throat x 2 days.  Initially had nonbloody diarrhea, headache, and bodyaches which have resolved.  No difficulty swallowing or changes to phonation.  Also admits to  persistent right flank/low back pain for the past few months.  Has chronic low back pain.  No saddle anesthesia, bowel/bladder incontinence, lower extremity numbness/tingling, lower extremity weakness.  No injury to back.  No history of IV drug use or cancer.  No history of kidney stones.  Upon arrival, stable vitals.  Patient in no acute distress.  Mild  erythema to throat without tonsillar hypertrophy or exudates.  Uvula midline.  Tolerating oral secretions without difficulty.  No abscess.  Mild tenderness to right flank and right low back.  No rash to suggest shingles.  Bilateral lower extremities neurovascularly intact.  Low suspicion for cauda equina or central cord compression.  COVID/strep test ordered.  Added labs due to persistent flank/low back pain however, possible MSK etiology given longevity of symptoms. lower suspicion for infection. UA to rule out UTI/pyelonephritis.  CT renal study ordered to rule out kidney stone.  COVID, flu, strep negative.  Suspect viral etiology of sore throat.  CBC reassuring.  No leukocytosis.  Normal hemoglobin.  CMP unremarkable.  Normal renal function.  No major electrolyte derangements.  UA negative for signs of infection.  CT renal study negative for kidney stone.  Does demonstrate gallstones.  Patient has no right upper quadrant tenderness.  Negative Murphy sign.  Low suspicion for acute cholecystitis.  Patient informed about her gallstones.  Advised patient to continue monitoring symptoms.  Possible MSK etiology of right back/flank pain.  Low suspicion for pyelonephritis given reassuring UA.  Patient discharged with symptomatic treatment for sore throat and back pain.  Advised patient to follow-up with PCP in the next few days for recheck.  Patient stable for discharge. Strict ED precautions discussed with patient. Patient states understanding and agrees to plan. Patient discharged home in no acute distress and stable vitals  Lives at home Has PCP Hx bipolar,  chronic pain       Final Clinical Impression(s) / ED Diagnoses Final diagnoses:  Acute right-sided low back pain without sciatica  Sore throat  Gallstones    Rx / DC Orders ED Discharge Orders          Ordered    lidocaine (XYLOCAINE) 2 % solution  As needed,   Status:  Discontinued        12/26/22 1201    lidocaine (XYLOCAINE) 2 % solution  As needed        12/26/22 1202    methocarbamol (ROBAXIN) 500 MG tablet  2 times daily        12/26/22 1202              Jesusita Oka 12/26/22 1203    Vanetta Mulders, MD 12/26/22 1502

## 2022-12-26 NOTE — ED Notes (Signed)
Pt provided with a Gatorade.

## 2022-12-26 NOTE — ED Triage Notes (Signed)
Pt arrives pov, steady gait c/o sore throat x 2 days. HA yesterday. Also reports diarrhea x 2 days pta that has resolved.  Endorses RT flank pain x 2 months

## 2023-01-21 ENCOUNTER — Other Ambulatory Visit: Payer: Self-pay | Admitting: Internal Medicine

## 2023-01-21 ENCOUNTER — Encounter: Payer: Self-pay | Admitting: Internal Medicine

## 2023-01-21 DIAGNOSIS — N6320 Unspecified lump in the left breast, unspecified quadrant: Secondary | ICD-10-CM

## 2023-02-10 ENCOUNTER — Other Ambulatory Visit: Payer: 59

## 2023-08-30 ENCOUNTER — Emergency Department (HOSPITAL_BASED_OUTPATIENT_CLINIC_OR_DEPARTMENT_OTHER): Admitting: Radiology

## 2023-08-30 ENCOUNTER — Other Ambulatory Visit: Payer: Self-pay

## 2023-08-30 ENCOUNTER — Other Ambulatory Visit (HOSPITAL_BASED_OUTPATIENT_CLINIC_OR_DEPARTMENT_OTHER): Admitting: Radiology

## 2023-08-30 ENCOUNTER — Encounter (HOSPITAL_BASED_OUTPATIENT_CLINIC_OR_DEPARTMENT_OTHER): Payer: Self-pay | Admitting: Emergency Medicine

## 2023-08-30 ENCOUNTER — Emergency Department (HOSPITAL_BASED_OUTPATIENT_CLINIC_OR_DEPARTMENT_OTHER)
Admission: EM | Admit: 2023-08-30 | Discharge: 2023-08-30 | Disposition: A | Discharge status: Home / Self Care | Attending: Emergency Medicine | Admitting: Emergency Medicine

## 2023-08-30 DIAGNOSIS — S199XXA Unspecified injury of neck, initial encounter: Secondary | ICD-10-CM | POA: Diagnosis present

## 2023-08-30 DIAGNOSIS — S161XXA Strain of muscle, fascia and tendon at neck level, initial encounter: Secondary | ICD-10-CM | POA: Insufficient documentation

## 2023-08-30 DIAGNOSIS — S46912A Strain of unspecified muscle, fascia and tendon at shoulder and upper arm level, left arm, initial encounter: Secondary | ICD-10-CM | POA: Diagnosis not present

## 2023-08-30 DIAGNOSIS — Y92410 Unspecified street and highway as the place of occurrence of the external cause: Secondary | ICD-10-CM | POA: Insufficient documentation

## 2023-08-30 DIAGNOSIS — R110 Nausea: Secondary | ICD-10-CM | POA: Diagnosis not present

## 2023-08-30 MED ORDER — ONDANSETRON 4 MG PO TBDP: 4.0000 mg | ORAL_TABLET | Freq: Three times a day (TID) | ORAL | 0 refills | Status: DC | PRN

## 2023-08-30 MED ORDER — DIAZEPAM 5 MG PO TABS: 5.0000 mg | ORAL_TABLET | Freq: Two times a day (BID) | ORAL | 0 refills | Status: DC

## 2023-08-30 MED ORDER — ONDANSETRON 4 MG PO TBDP
4.0000 mg | ORAL_TABLET | Freq: Once | ORAL | Status: AC
  Administered 2023-08-30: 4 mg via ORAL
  Filled 2023-08-30: qty 1

## 2023-08-30 MED ORDER — KETOROLAC TROMETHAMINE 30 MG/ML IJ SOLN
30.0000 mg | Freq: Once | INTRAMUSCULAR | Status: AC
  Administered 2023-08-30: 30 mg via INTRAMUSCULAR
  Filled 2023-08-30: qty 1

## 2023-08-30 MED ORDER — DIAZEPAM 5 MG PO TABS: 5.0000 mg | ORAL_TABLET | Freq: Once | ORAL | Status: DC
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
## 2023-08-30 NOTE — ED Notes (Signed)
 Dc instructions reviewed with patient. Patient voiced understanding. Dc with belongings.

## 2023-08-30 NOTE — Discharge Instructions (Signed)
 Continue taking your normal pain medication.  Add the valium to help with muscle spasm as needed.  It is ok to take either ibuprofen OR advil OR motrin OR aleve as well.
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
## 2023-12-19 IMAGING — DX DG KNEE COMPLETE 4+V*R*
4 series · 4 of 4 positions shown · non-contrast
Comparison: Right knee x-ray 10/14/2021

CLINICAL DATA: Right knee pain

EXAM:
RIGHT KNEE - COMPLETE 4+ VIEW

[knee obl (1 of 2)]
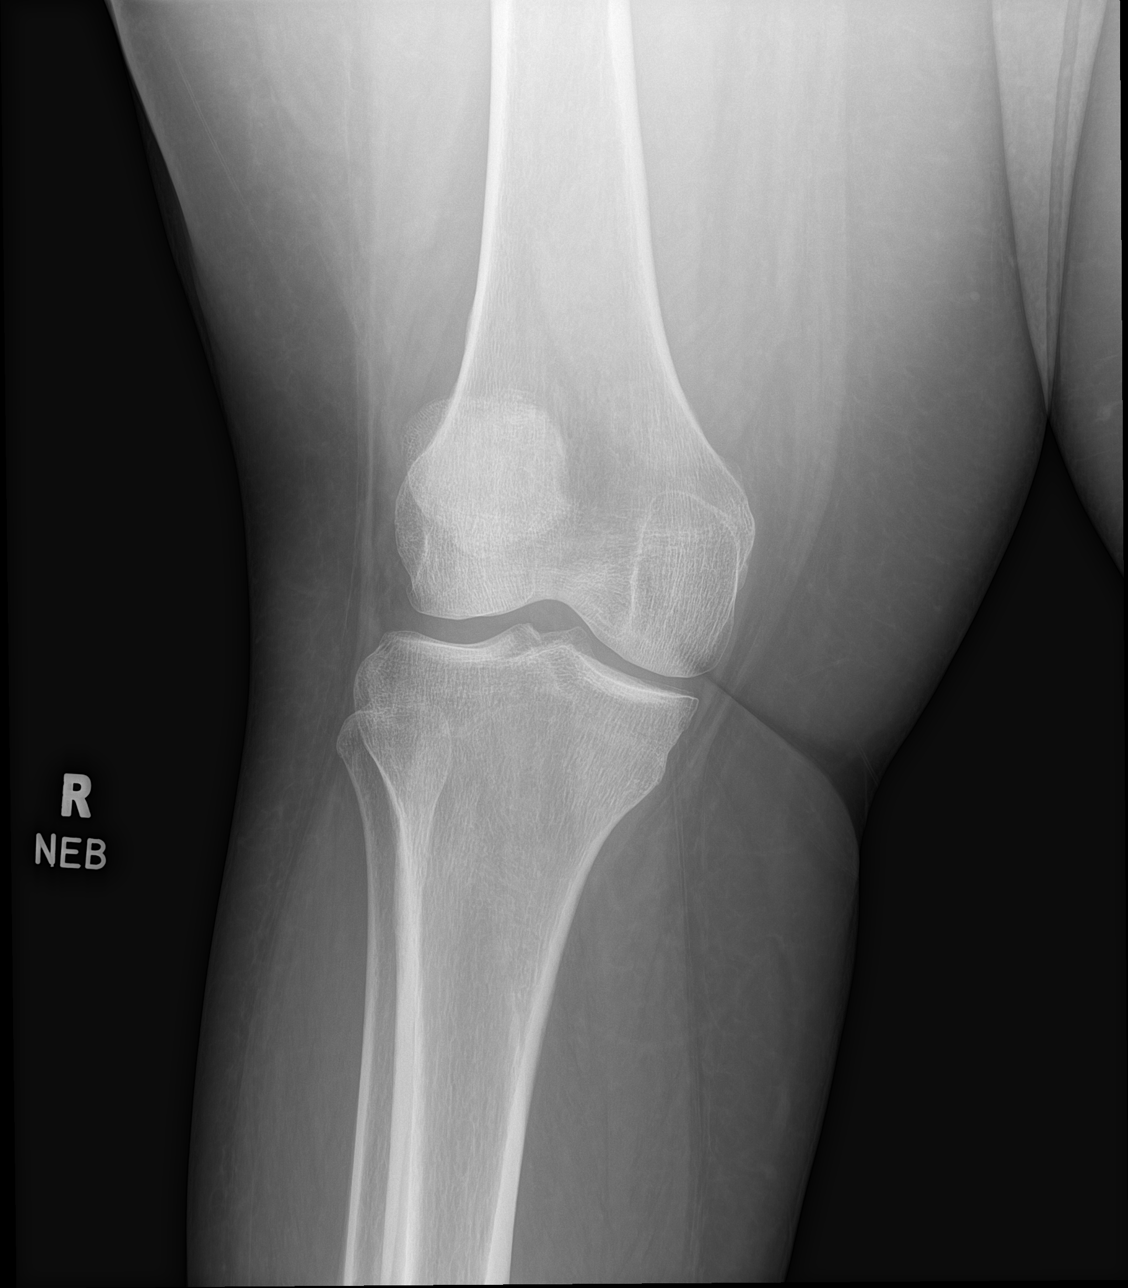

[knee obl (2 of 2)]
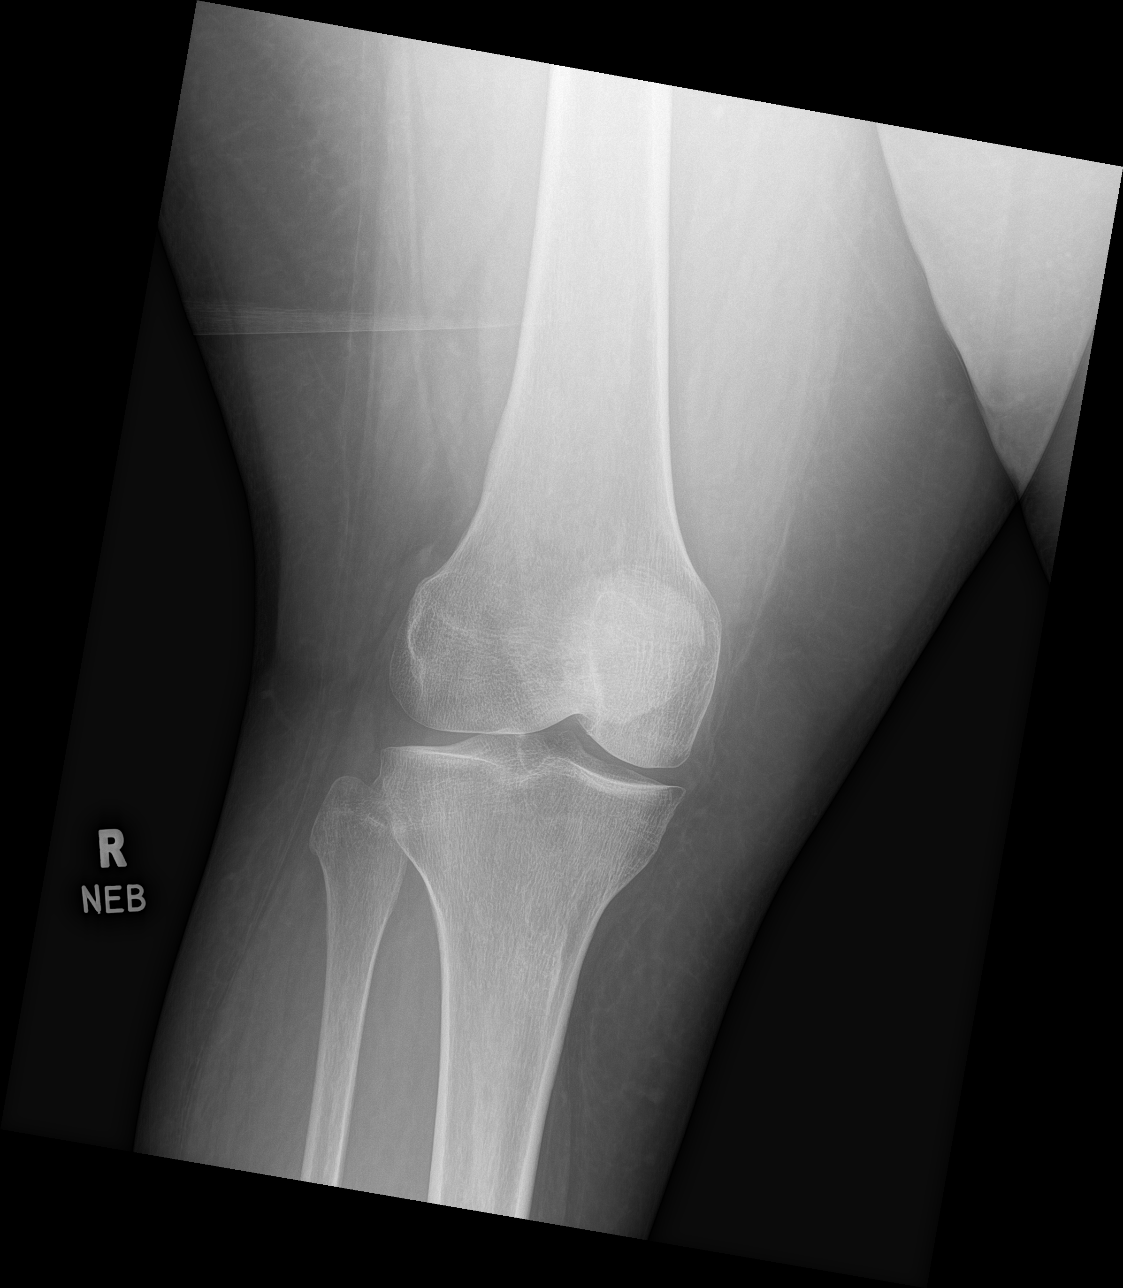

[knee lat]
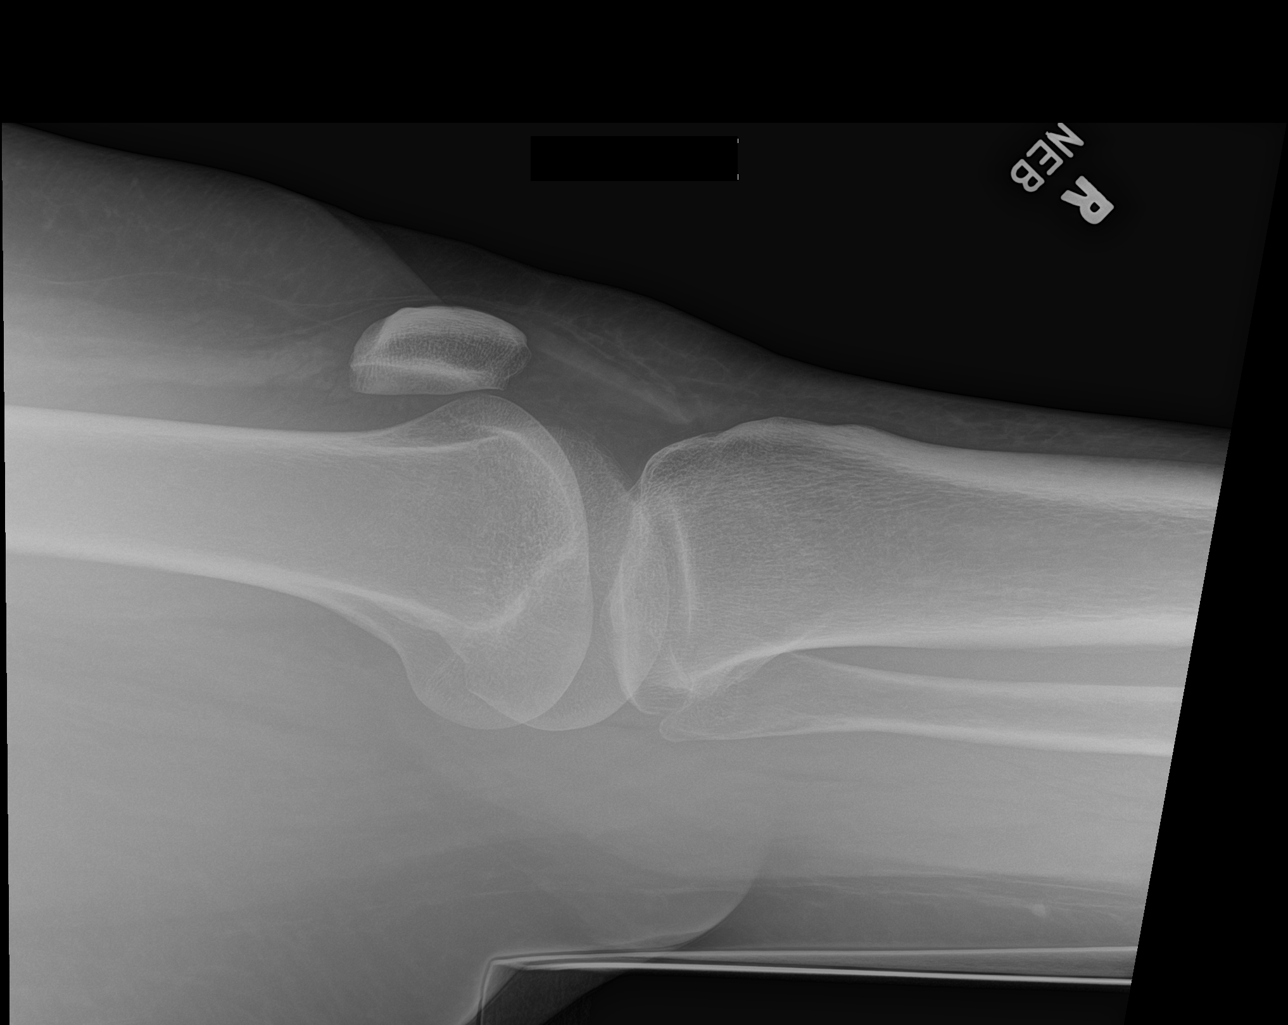

[knee ap]
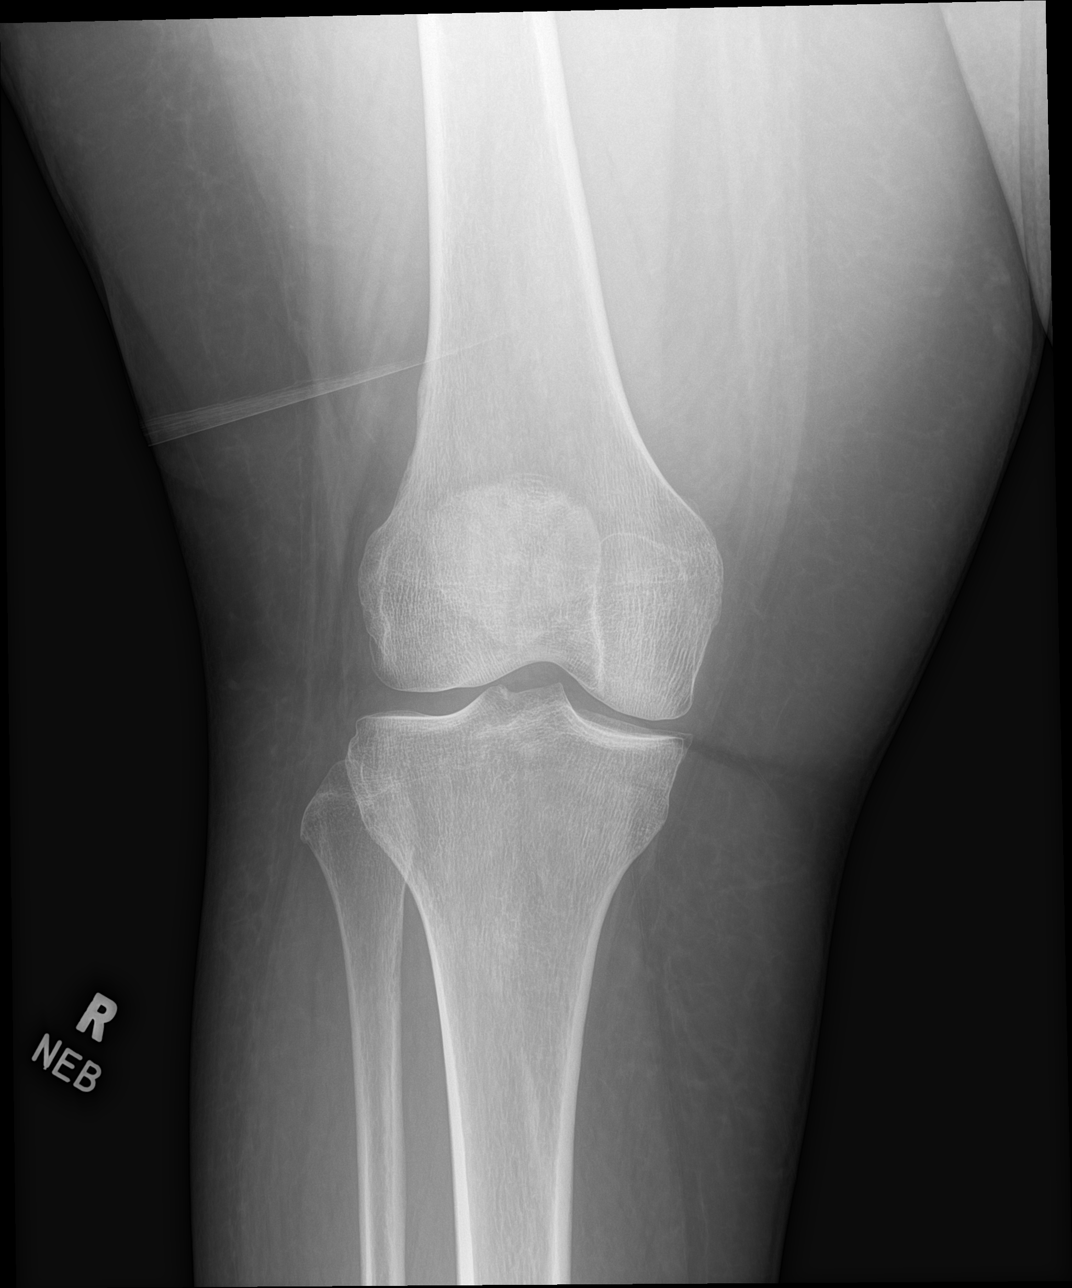

[4 of 4 positions shown; findings below may reference images not displayed]

FINDINGS: No evidence of fracture, dislocation, or joint effusion. No evidence
of arthropathy or other focal bone abnormality. Soft tissues are
unremarkable.
IMPRESSION: Negative.

## 2024-06-26 ENCOUNTER — Emergency Department (HOSPITAL_BASED_OUTPATIENT_CLINIC_OR_DEPARTMENT_OTHER)

## 2024-06-26 ENCOUNTER — Encounter (HOSPITAL_BASED_OUTPATIENT_CLINIC_OR_DEPARTMENT_OTHER): Payer: Self-pay

## 2024-06-26 ENCOUNTER — Observation Stay (HOSPITAL_BASED_OUTPATIENT_CLINIC_OR_DEPARTMENT_OTHER)
Admit: 2024-06-26 | Discharge: 2024-06-30 | Disposition: A | Source: Home / Self Care | Attending: Orthopedic Surgery | Admitting: Orthopedic Surgery

## 2024-06-26 DIAGNOSIS — F1190 Opioid use, unspecified, uncomplicated: Secondary | ICD-10-CM | POA: Diagnosis present

## 2024-06-26 DIAGNOSIS — S82302A Unspecified fracture of lower end of left tibia, initial encounter for closed fracture: Principal | ICD-10-CM

## 2024-06-26 DIAGNOSIS — S82301A Unspecified fracture of lower end of right tibia, initial encounter for closed fracture: Secondary | ICD-10-CM | POA: Diagnosis present

## 2024-06-26 DIAGNOSIS — G894 Chronic pain syndrome: Secondary | ICD-10-CM | POA: Diagnosis present

## 2024-06-26 DIAGNOSIS — E559 Vitamin D deficiency, unspecified: Secondary | ICD-10-CM | POA: Diagnosis present

## 2024-06-26 DIAGNOSIS — S82409A Unspecified fracture of shaft of unspecified fibula, initial encounter for closed fracture: Secondary | ICD-10-CM | POA: Diagnosis present

## 2024-06-26 DIAGNOSIS — F32A Depression, unspecified: Secondary | ICD-10-CM | POA: Diagnosis present

## 2024-06-26 DIAGNOSIS — F419 Anxiety disorder, unspecified: Secondary | ICD-10-CM | POA: Diagnosis present

## 2024-06-26 HISTORY — DX: Prediabetes: R73.03

## 2024-06-26 LAB — CBC WITH DIFFERENTIAL/PLATELET
Abs Immature Granulocytes: 0.02 10*3/uL (ref 0.00–0.07)
Basophils Absolute: 0 10*3/uL (ref 0.0–0.1)
Basophils Relative: 1 %
Eosinophils Absolute: 0.1 10*3/uL (ref 0.0–0.5)
Eosinophils Relative: 1 %
HCT: 41.2 % (ref 36.0–46.0)
Hemoglobin: 12.6 g/dL (ref 12.0–15.0)
Immature Granulocytes: 0 %
Lymphocytes Relative: 22 %
Lymphs Abs: 1.9 10*3/uL (ref 0.7–4.0)
MCH: 21.8 pg — ABNORMAL LOW (ref 26.0–34.0)
MCHC: 30.6 g/dL (ref 30.0–36.0)
MCV: 71.3 fL — ABNORMAL LOW (ref 80.0–100.0)
Monocytes Absolute: 0.5 10*3/uL (ref 0.1–1.0)
Monocytes Relative: 6 %
Neutro Abs: 5.9 10*3/uL (ref 1.7–7.7)
Neutrophils Relative %: 70 %
Platelets: 227 10*3/uL (ref 150–400)
RBC: 5.78 MIL/uL — ABNORMAL HIGH (ref 3.87–5.11)
RDW: 15.2 % (ref 11.5–15.5)
WBC: 8.4 10*3/uL (ref 4.0–10.5)
nRBC: 0 % (ref 0.0–0.2)

## 2024-06-26 LAB — BASIC METABOLIC PANEL WITH GFR
Anion gap: 10 (ref 5–15)
BUN: 14 mg/dL (ref 6–20)
CO2: 25 mmol/L (ref 22–32)
Calcium: 9.6 mg/dL (ref 8.9–10.3)
Chloride: 104 mmol/L (ref 98–111)
Creatinine, Ser: 0.67 mg/dL (ref 0.44–1.00)
GFR, Estimated: >60 mL/min
Glucose, Bld: 100 mg/dL — ABNORMAL HIGH (ref 70–99)
Potassium: 4.1 mmol/L (ref 3.5–5.1)
Sodium: 138 mmol/L (ref 135–145)

## 2024-06-26 MED ORDER — OXYCODONE HCL 5 MG PO TABS: 5.0000 mg | ORAL_TABLET | ORAL | Status: DC | PRN

## 2024-06-26 MED ORDER — ACETAMINOPHEN 500 MG PO TABS
1000.0000 mg | ORAL_TABLET | Freq: Four times a day (QID) | ORAL | Status: DC
  Administered 2024-06-27: 1000 mg via ORAL
  Filled 2024-06-26 (×2): qty 2

## 2024-06-26 MED ORDER — FENTANYL CITRATE (PF) 50 MCG/ML IJ SOSY
50.0000 ug | PREFILLED_SYRINGE | Freq: Once | INTRAMUSCULAR | Status: AC
  Administered 2024-06-26: 50 ug via INTRAVENOUS
  Filled 2024-06-26: qty 1

## 2024-06-26 MED ORDER — POLYETHYLENE GLYCOL 3350 17 G PO PACK: 17.0000 g | PACK | Freq: Every day | ORAL | Status: DC | PRN

## 2024-06-26 MED ORDER — METHOCARBAMOL 1000 MG/10ML IJ SOLN: 500.0000 mg | Freq: Four times a day (QID) | INTRAMUSCULAR | Status: DC | PRN

## 2024-06-26 MED ORDER — SENNA 8.6 MG PO TABS
1.0000 | ORAL_TABLET | Freq: Two times a day (BID) | ORAL | Status: DC
  Filled 2024-06-26: qty 1

## 2024-06-26 MED ORDER — OXYCODONE-ACETAMINOPHEN 5-325 MG PO TABS
1.0000 | ORAL_TABLET | Freq: Once | ORAL | Status: AC
  Administered 2024-06-26: 1 via ORAL
  Filled 2024-06-26: qty 1

## 2024-06-26 MED ORDER — METHOCARBAMOL 500 MG PO TABS
500.0000 mg | ORAL_TABLET | Freq: Four times a day (QID) | ORAL | Status: DC | PRN
  Administered 2024-06-26 – 2024-06-27 (×2): 500 mg via ORAL
  Filled 2024-06-26 (×2): qty 1

## 2024-06-26 MED ORDER — OXYCODONE HCL 5 MG PO TABS
10.0000 mg | ORAL_TABLET | ORAL | Status: DC | PRN
  Administered 2024-06-26 – 2024-06-27 (×3): 15 mg via ORAL
  Filled 2024-06-26 (×3): qty 3

## 2024-06-26 MED ORDER — MORPHINE SULFATE (PF) 4 MG/ML IV SOLN
4.0000 mg | Freq: Once | INTRAVENOUS | Status: AC
  Administered 2024-06-26: 4 mg via INTRAVENOUS
  Filled 2024-06-26: qty 1
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
## 2024-06-26 NOTE — Progress Notes (Signed)
 Patient ID: [name], female   DOB: August 04, 1975, 49 y.o.   MRN: [id number]  Admitting for operative management of her right tibia fracture NPO for OR tomorrow with Dr Celena Full H&P to follow
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
## 2024-06-26 NOTE — ED Provider Notes (Signed)
 "  EMERGENCY DEPARTMENT AT [facility] Provider Note   CSN: [id number] Arrival date & time: 06/26/24  1604     Patient presents with: Ankle Pain   [name] is a 49 y.o. female.  {Add pertinent medical, surgical, social history, OB history to HPI:32947}  Ankle Pain 49 year old female presenting after a fall.  Patient was getting out of a golf cart when she slipped on the ice.  She felt a pop in her left ankle.  She is now having a lot of pain and swelling in that leg.  She reports that she has had a fall and break like this before.  She reports this is the same pain as last time.  She denies hitting her head.  She denies any loss of consciousness.  She is not on a blood thinner.     Prior to Admission medications  Medication Sig Start Date End Date Taking? Authorizing Provider  amphetamine-dextroamphetamine (ADDERALL) 30 MG tablet Take 1 tablet by mouth 2 (two) times daily. 09/29/20   [provider]  baclofen  (LIORESAL ) 10 MG tablet Take 1-2 tablets (10-20 mg total) by mouth 3 (three) times daily as needed for muscle spasms. 05/28/20   Hilts, Ozell, MD  celecoxib  (CELEBREX ) 200 MG capsule TAKE 1 CAPSULE (200 MG TOTAL) BY MOUTH 2 (TWO) TIMES DAILY AS NEEDED. 12/31/20   Hilts, Ozell, MD  citalopram (CELEXA) 40 MG tablet Take 40 mg by mouth at bedtime. 01/06/18   [provider]  diazepam  (VALIUM ) 5 MG tablet Take 1 tablet (5 mg total) by mouth 2 (two) times daily. 08/30/23   Dean Clarity, MD  Fe Cbn-Fe Gluc-FA-B12-C-DSS (FERRALET 90) 90-1 MG TABS Take 1 tablet by mouth daily. 06/08/16   [provider]  ibuprofen  (ADVIL ) 800 MG tablet Take 800 mg by mouth 3 (three) times daily. Patient not taking: Reported on 05/08/2022 03/30/20   [provider]  lidocaine  (XYLOCAINE ) 2 % solution Use as directed 15 mLs in the mouth or throat as needed for mouth pain. 12/26/22   Aberman, Caroline C, PA-C  lidocaine  (XYLOCAINE ) 5 % ointment Apply  topically. 03/30/20   [provider]  methocarbamol  (ROBAXIN ) 500 MG tablet Take 1 tablet (500 mg total) by mouth 2 (two) times daily. 09/16/20   Neldon Hamp RAMAN, PA  methocarbamol  (ROBAXIN ) 500 MG tablet Take 1 tablet (500 mg total) by mouth every 8 (eight) hours as needed for muscle spasms. 10/18/21   Schuyler Charlie RAMAN, MD  methocarbamol  (ROBAXIN ) 500 MG tablet Take 1 tablet (500 mg total) by mouth 2 (two) times daily. 12/26/22   Lorelle Aleck BROCKS, PA-C  metroNIDAZOLE  (FLAGYL ) 500 MG tablet Take 1 tablet (500 mg total) by mouth 2 (two) times daily. 05/12/22   Rudy Carlin LABOR, MD  mirtazapine (REMERON) 30 MG tablet Take 30 mg by mouth daily. 12/24/15   [provider]  naproxen  sodium (ANAPROX  DS) 550 MG tablet Take 1 tablet (550 mg total) by mouth 2 (two) times daily with a meal. Patient not taking: Reported on 10/19/2020 08/22/20   Eveline Lynwood MATSU, MD  ondansetron  (ZOFRAN -ODT) 4 MG disintegrating tablet Take 1 tablet (4 mg total) by mouth every 8 (eight) hours as needed for nausea or vomiting. 08/30/23   Haviland, Julie, MD  Oxycodone  HCl 20 MG TABS Take by mouth. 05/08/20   [provider]  OZEMPIC, 2 MG/DOSE, 8 MG/3ML SOPN Inject 2 mg into the skin once a week. 04/29/22   [provider]  topiramate (TOPAMAX) 100 MG tablet Take 100 mg by mouth 2 (two) times daily. 09/30/20   [provider]  triamcinolone cream (KENALOG) 0.5 % Apply topically. 06/01/20   [provider]    Allergies: Dilaudid  [hydromorphone ] and Buspar [buspirone]    Review of Systems  All other systems reviewed and are negative.   Updated Vital Signs BP (!) 142/67 (BP Location: Right Arm)   Pulse 91   Temp 97.7 F (36.5 C) (Oral)   Resp 16   LMP 06/14/2024 (Approximate)   SpO2 100%   Physical Exam Vitals and nursing note reviewed.  HENT:     Mouth/Throat:     Pharynx: Oropharynx is clear.  Cardiovascular:     Rate and Rhythm: Normal rate.     Pulses: Normal  pulses.  Pulmonary:     Effort: Pulmonary effort is normal.     Breath sounds: Normal breath sounds.  Musculoskeletal:     Right lower leg: Normal.     Left lower leg: Swelling, deformity, tenderness and bony tenderness present. No lacerations.     Right ankle: Normal.     Left ankle: Swelling present. No lacerations. Tenderness present. Decreased range of motion. Normal pulse.       Legs:     Comments: Patient is able to move toes.  She has good sensation in her left foot.  Pedal pulses are 2+.  No bruising noted.  There is some swelling and slight deformity to left lower leg.  Skin:    General: Skin is warm and dry.  Neurological:     General: No focal deficit present.     Mental Status: She is alert.     (all labs ordered are listed, but only abnormal results are displayed) Labs Reviewed - No data to display  EKG: None  Radiology: No results found.  {Document cardiac monitor, telemetry assessment procedure when appropriate:32947} Procedures   Medications Ordered in the ED  oxyCODONE -acetaminophen  (PERCOCET/ROXICET) 5-325 MG per tablet 1 tablet (has no administration in time range)      {Click here for ABCD2, HEART and other calculators REFRESH Note before signing:1}                              Medical Decision Making Amount and/or Complexity of Data Reviewed Radiology: ordered.  Risk Prescription drug management.   ***  {Document critical care time when appropriate  Document review of labs and clinical decision tools ie CHADS2VASC2, etc  Document your independent review of radiology images and any outside records  Document your discussion with family members, caretakers and with consultants  Document social determinants of health affecting pt's care  Document your decision making why or why not admission, treatments were needed:32947:::1}   Final diagnoses:  None    ED Discharge Orders     None        "

## 2024-06-26 NOTE — Anesthesia Preprocedure Evaluation (Signed)
"                                    Anesthesia Evaluation  Patient identified by MRN, date of birth, ID band Patient awake    Reviewed: Allergy & Precautions, H&P , NPO status , Patient's Chart, lab work & pertinent test results, reviewed documented beta blocker date and time   History of Anesthesia Complications (+) DIFFICULT IV STICK / SPECIAL LINE and history of anesthetic complications  Airway Mallampati: I  TM Distance: >3 FB Neck ROM: full    Dental  (+) Teeth Intact, Dental Advisory Given   Pulmonary neg pulmonary ROS   Pulmonary exam normal breath sounds clear to auscultation       Cardiovascular Exercise Tolerance: Good negative cardio ROS  Rhythm:regular Rate:Normal     Neuro/Psych  Headaches PSYCHIATRIC DISORDERS Anxiety Depression Bipolar Disorder   Chronic back pain    GI/Hepatic negative GI ROS, Neg liver ROS,,,  Endo/Other  negative endocrine ROS  Class 3 obesityOn OZempic  Renal/GU negative Renal ROS  negative genitourinary   Musculoskeletal  (+) Arthritis ,    Abdominal   Peds  Hematology  (+) Blood dyscrasia, anemia   Anesthesia Other Findings   Reproductive/Obstetrics negative OB ROS                              Anesthesia Physical Anesthesia Plan  ASA: 3 and emergent  Anesthesia Plan: General   Post-op Pain Management: Precedex , Ofirmev  IV (intra-op)*, Gabapentin  PO (pre-op)* and Ketamine  IV*   Induction: Intravenous  PONV Risk Score and Plan: 3 and Treatment may vary due to age or medical condition, Midazolam , Ondansetron  and Dexamethasone   Airway Management Planned: Oral ETT  Additional Equipment: None  Intra-op Plan:   Post-operative Plan: Extubation in OR  Informed Consent: I have reviewed the patients History and Physical, chart, labs and discussed the procedure including the risks, benefits and alternatives for the proposed anesthesia with the patient or authorized representative  who has indicated his/her understanding and acceptance.     Dental advisory given  Plan Discussed with: CRNA and Anesthesiologist  Anesthesia Plan Comments:          Anesthesia Quick Evaluation  "

## 2024-06-27 ENCOUNTER — Encounter (HOSPITAL_COMMUNITY): Payer: Self-pay | Admitting: Orthopedic Surgery

## 2024-06-27 ENCOUNTER — Other Ambulatory Visit: Payer: Self-pay

## 2024-06-27 ENCOUNTER — Emergency Department (HOSPITAL_COMMUNITY)

## 2024-06-27 ENCOUNTER — Encounter (HOSPITAL_COMMUNITY): Admitting: Anesthesiology

## 2024-06-27 ENCOUNTER — Emergency Department (HOSPITAL_BASED_OUTPATIENT_CLINIC_OR_DEPARTMENT_OTHER)

## 2024-06-27 ENCOUNTER — Encounter (HOSPITAL_COMMUNITY): Disposition: A | Payer: Self-pay | Source: Home / Self Care | Attending: Orthopedic Surgery

## 2024-06-27 LAB — CBC
HCT: 42.9 % (ref 36.0–46.0)
Hemoglobin: 13 g/dL (ref 12.0–15.0)
MCH: 21.8 pg — ABNORMAL LOW (ref 26.0–34.0)
MCHC: 30.3 g/dL (ref 30.0–36.0)
MCV: 71.9 fL — ABNORMAL LOW (ref 80.0–100.0)
Platelets: 213 10*3/uL (ref 150–400)
RBC: 5.97 MIL/uL — ABNORMAL HIGH (ref 3.87–5.11)
RDW: 15.2 % (ref 11.5–15.5)
WBC: 10 10*3/uL (ref 4.0–10.5)
nRBC: 0 % (ref 0.0–0.2)

## 2024-06-27 LAB — COMPREHENSIVE METABOLIC PANEL WITH GFR
ALT: 15 U/L (ref 0–44)
AST: 20 U/L (ref 15–41)
Albumin: 3.8 g/dL (ref 3.5–5.0)
Alkaline Phosphatase: 87 U/L (ref 38–126)
Anion gap: 9 (ref 5–15)
BUN: 13 mg/dL (ref 6–20)
CO2: 24 mmol/L (ref 22–32)
Calcium: 9 mg/dL (ref 8.9–10.3)
Chloride: 102 mmol/L (ref 98–111)
Creatinine, Ser: 0.75 mg/dL (ref 0.44–1.00)
GFR, Estimated: >60 mL/min
Glucose, Bld: 151 mg/dL — ABNORMAL HIGH (ref 70–99)
Potassium: 4.4 mmol/L (ref 3.5–5.1)
Sodium: 135 mmol/L (ref 135–145)
Total Bilirubin: 0.3 mg/dL (ref 0.0–1.2)
Total Protein: 7.6 g/dL (ref 6.5–8.1)

## 2024-06-27 LAB — SURGICAL PCR SCREEN
MRSA, PCR: NEGATIVE
Staphylococcus aureus: NEGATIVE

## 2024-06-27 LAB — PREGNANCY, URINE: Preg Test, Ur: NEGATIVE

## 2024-06-27 MED ORDER — ONDANSETRON HCL 4 MG/2ML IJ SOLN
INTRAMUSCULAR | Status: DC | PRN
  Administered 2024-06-27: 4 mg via INTRAVENOUS

## 2024-06-27 MED ORDER — ENOXAPARIN SODIUM 30 MG/0.3ML IJ SOSY
30.0000 mg | PREFILLED_SYRINGE | Freq: Two times a day (BID) | INTRAMUSCULAR | Status: DC
  Administered 2024-06-28 – 2024-06-30 (×5): 30 mg via SUBCUTANEOUS
  Filled 2024-06-27 (×5): qty 0.3

## 2024-06-27 MED ORDER — FENTANYL CITRATE (PF) 250 MCG/5ML IJ SOLN
INTRAMUSCULAR | Status: DC | PRN
  Administered 2024-06-27: 100 ug via INTRAVENOUS
  Administered 2024-06-27: 50 ug via INTRAVENOUS
  Administered 2024-06-27: 100 ug via INTRAVENOUS

## 2024-06-27 MED ORDER — SENNA 8.6 MG PO TABS
1.0000 | ORAL_TABLET | Freq: Every day | ORAL | Status: DC
  Administered 2024-06-27 – 2024-06-30 (×4): 8.6 mg via ORAL
  Filled 2024-06-27 (×5): qty 1

## 2024-06-27 MED ORDER — TRANEXAMIC ACID-NACL 1000-0.7 MG/100ML-% IV SOLN: 1000.0000 mg | INTRAVENOUS | Status: DC

## 2024-06-27 MED ORDER — METHOCARBAMOL 1000 MG/10ML IJ SOLN
1000.0000 mg | Freq: Three times a day (TID) | INTRAMUSCULAR | Status: DC
  Filled 2024-06-27: qty 10

## 2024-06-27 MED ORDER — POLYETHYLENE GLYCOL 3350 17 G PO PACK
17.0000 g | PACK | Freq: Every day | ORAL | Status: DC
  Administered 2024-06-28 – 2024-06-30 (×3): 17 g via ORAL
  Filled 2024-06-27 (×4): qty 1

## 2024-06-27 MED ORDER — LIDOCAINE 2% (20 MG/ML) 5 ML SYRINGE
INTRAMUSCULAR | Status: AC
  Filled 2024-06-27: qty 10

## 2024-06-27 MED ORDER — DEXAMETHASONE SOD PHOSPHATE PF 10 MG/ML IJ SOLN
INTRAMUSCULAR | Status: DC | PRN
  Administered 2024-06-27: 10 mg via INTRAVENOUS

## 2024-06-27 MED ORDER — MIDAZOLAM HCL (PF) 2 MG/2ML IJ SOLN
INTRAMUSCULAR | Status: DC | PRN
  Administered 2024-06-27: 2 mg via INTRAVENOUS

## 2024-06-27 MED ORDER — ORAL CARE MOUTH RINSE: 15.0000 mL | Freq: Once | OROMUCOSAL | Status: AC

## 2024-06-27 MED ORDER — ACETAMINOPHEN 325 MG PO TABS
325.0000 mg | ORAL_TABLET | Freq: Four times a day (QID) | ORAL | Status: DC | PRN
  Administered 2024-06-30: 650 mg via ORAL
  Filled 2024-06-27: qty 2

## 2024-06-27 MED ORDER — CHLORHEXIDINE GLUCONATE 4 % EX SOLN: 60.0000 mL | Freq: Once | CUTANEOUS | Status: DC

## 2024-06-27 MED ORDER — CEFAZOLIN SODIUM-DEXTROSE 3-4 GM/150ML-% IV SOLN
3.0000 g | INTRAVENOUS | Status: AC
  Administered 2024-06-27: 3 g via INTRAVENOUS

## 2024-06-27 MED ORDER — MIDAZOLAM HCL 2 MG/2ML IJ SOLN
INTRAMUSCULAR | Status: AC
  Filled 2024-06-27: qty 2

## 2024-06-27 MED ORDER — OXYCODONE HCL 5 MG PO TABS
10.0000 mg | ORAL_TABLET | Freq: Four times a day (QID) | ORAL | Status: DC | PRN
  Administered 2024-06-27: 15 mg via ORAL
  Administered 2024-06-28: 10 mg via ORAL
  Administered 2024-06-28: 15 mg via ORAL
  Administered 2024-06-29: 10 mg via ORAL
  Filled 2024-06-27: qty 3
  Filled 2024-06-27 (×2): qty 2

## 2024-06-27 MED ORDER — PHENYLEPHRINE 80 MCG/ML (10ML) SYRINGE FOR IV PUSH (FOR BLOOD PRESSURE SUPPORT)
PREFILLED_SYRINGE | INTRAVENOUS | Status: AC
  Filled 2024-06-27: qty 10

## 2024-06-27 MED ORDER — POVIDONE-IODINE 10 % EX SWAB: 2.0000 | Freq: Once | CUTANEOUS | Status: DC

## 2024-06-27 MED ORDER — LACTATED RINGERS IV SOLN: INTRAVENOUS | Status: DC

## 2024-06-27 MED ORDER — METHOCARBAMOL 500 MG PO TABS
1000.0000 mg | ORAL_TABLET | Freq: Three times a day (TID) | ORAL | Status: DC
  Administered 2024-06-27 – 2024-06-30 (×8): 1000 mg via ORAL
  Filled 2024-06-27 (×10): qty 2

## 2024-06-27 MED ORDER — ROCURONIUM BROMIDE 10 MG/ML (PF) SYRINGE
PREFILLED_SYRINGE | INTRAVENOUS | Status: DC | PRN
  Administered 2024-06-27: 100 mg via INTRAVENOUS

## 2024-06-27 MED ORDER — 0.9 % SODIUM CHLORIDE (POUR BTL) OPTIME
TOPICAL | Status: DC | PRN
  Administered 2024-06-27: 1000 mL

## 2024-06-27 MED ORDER — DEXAMETHASONE SOD PHOSPHATE PF 10 MG/ML IJ SOLN
INTRAMUSCULAR | Status: AC
  Filled 2024-06-27: qty 2

## 2024-06-27 MED ORDER — PROPOFOL 10 MG/ML IV BOLUS
INTRAVENOUS | Status: DC | PRN
  Administered 2024-06-27: 180 mg via INTRAVENOUS

## 2024-06-27 MED ORDER — KETAMINE HCL 50 MG/5ML IJ SOSY
PREFILLED_SYRINGE | INTRAMUSCULAR | Status: DC | PRN
  Administered 2024-06-27 (×3): 50 mg via INTRAVENOUS

## 2024-06-27 MED ORDER — FENTANYL CITRATE (PF) 250 MCG/5ML IJ SOLN
INTRAMUSCULAR | Status: AC
  Filled 2024-06-27: qty 5

## 2024-06-27 MED ORDER — PROPOFOL 10 MG/ML IV BOLUS
INTRAVENOUS | Status: AC
  Filled 2024-06-27: qty 20

## 2024-06-27 MED ORDER — ONDANSETRON HCL 4 MG/2ML IJ SOLN
INTRAMUSCULAR | Status: AC
  Filled 2024-06-27: qty 4

## 2024-06-27 MED ORDER — DROPERIDOL 2.5 MG/ML IJ SOLN: 0.6250 mg | Freq: Once | INTRAMUSCULAR | Status: DC | PRN

## 2024-06-27 MED ORDER — OXYCODONE HCL 5 MG/5ML PO SOLN: 5.0000 mg | Freq: Once | ORAL | Status: DC | PRN

## 2024-06-27 MED ORDER — OXYCODONE HCL 5 MG PO TABS
20.0000 mg | ORAL_TABLET | Freq: Four times a day (QID) | ORAL | Status: DC | PRN
  Administered 2024-06-28 – 2024-06-30 (×3): 20 mg via ORAL
  Filled 2024-06-27 (×4): qty 4

## 2024-06-27 MED ORDER — ACETAMINOPHEN 500 MG PO TABS
1000.0000 mg | ORAL_TABLET | Freq: Three times a day (TID) | ORAL | Status: DC
  Administered 2024-06-27 – 2024-06-30 (×8): 1000 mg via ORAL
  Filled 2024-06-27 (×9): qty 2

## 2024-06-27 MED ORDER — DEXMEDETOMIDINE HCL IN NACL 80 MCG/20ML IV SOLN
INTRAVENOUS | Status: AC
  Filled 2024-06-27: qty 40

## 2024-06-27 MED ORDER — ROCURONIUM BROMIDE 10 MG/ML (PF) SYRINGE
PREFILLED_SYRINGE | INTRAVENOUS | Status: AC
  Filled 2024-06-27: qty 20

## 2024-06-27 MED ORDER — ACETAMINOPHEN 10 MG/ML IV SOLN: 1000.0000 mg | Freq: Once | INTRAVENOUS | Status: DC | PRN

## 2024-06-27 MED ORDER — SUGAMMADEX SODIUM 200 MG/2ML IV SOLN
INTRAVENOUS | Status: DC | PRN
  Administered 2024-06-27: 400 mg via INTRAVENOUS

## 2024-06-27 MED ORDER — SUCCINYLCHOLINE CHLORIDE 200 MG/10ML IV SOSY
PREFILLED_SYRINGE | INTRAVENOUS | Status: DC | PRN
  Administered 2024-06-27: 20 mg via INTRAVENOUS

## 2024-06-27 MED ORDER — METOCLOPRAMIDE HCL 5 MG PO TABS: 5.0000 mg | ORAL_TABLET | Freq: Three times a day (TID) | ORAL | Status: DC | PRN

## 2024-06-27 MED ORDER — CHLORHEXIDINE GLUCONATE 0.12 % MT SOLN: 15.0000 mL | Freq: Once | OROMUCOSAL | Status: AC

## 2024-06-27 MED ORDER — FENTANYL CITRATE (PF) 100 MCG/2ML IJ SOLN: 25.0000 ug | INTRAMUSCULAR | Status: DC | PRN

## 2024-06-27 MED ORDER — PHENYLEPHRINE HCL (PRESSORS) 10 MG/ML IV SOLN
INTRAVENOUS | Status: DC | PRN
  Administered 2024-06-27: 200 ug via INTRAVENOUS
  Administered 2024-06-27 (×2): 100 ug via INTRAVENOUS

## 2024-06-27 MED ORDER — ONDANSETRON HCL 4 MG/2ML IJ SOLN
4.0000 mg | Freq: Four times a day (QID) | INTRAMUSCULAR | Status: DC | PRN
  Administered 2024-06-28: 4 mg via INTRAVENOUS
  Filled 2024-06-27: qty 2

## 2024-06-27 MED ORDER — MORPHINE SULFATE (PF) 2 MG/ML IV SOLN
2.0000 mg | INTRAVENOUS | Status: DC | PRN
  Administered 2024-06-28 (×2): 4 mg via INTRAVENOUS
  Administered 2024-06-29 (×2): 2 mg via INTRAVENOUS
  Filled 2024-06-27: qty 1
  Filled 2024-06-27: qty 2
  Filled 2024-06-27: qty 1
  Filled 2024-06-27: qty 2

## 2024-06-27 MED ORDER — CEFAZOLIN SODIUM-DEXTROSE 3-4 GM/150ML-% IV SOLN
INTRAVENOUS | Status: AC
  Filled 2024-06-27: qty 150

## 2024-06-27 MED ORDER — POTASSIUM CHLORIDE IN NACL 20-0.9 MEQ/L-% IV SOLN
INTRAVENOUS | Status: DC
  Filled 2024-06-27 (×2): qty 1000

## 2024-06-27 MED ORDER — METOCLOPRAMIDE HCL 5 MG/ML IJ SOLN: 5.0000 mg | Freq: Three times a day (TID) | INTRAMUSCULAR | Status: DC | PRN

## 2024-06-27 MED ORDER — ONDANSETRON HCL 4 MG PO TABS
4.0000 mg | ORAL_TABLET | Freq: Four times a day (QID) | ORAL | Status: DC | PRN
  Administered 2024-06-29: 4 mg via ORAL
  Filled 2024-06-27: qty 1

## 2024-06-27 MED ORDER — CHLORHEXIDINE GLUCONATE 0.12 % MT SOLN
OROMUCOSAL | Status: AC
  Administered 2024-06-27: 15 mL via OROMUCOSAL
  Filled 2024-06-27: qty 15

## 2024-06-27 MED ORDER — KETAMINE HCL 50 MG/5ML IJ SOSY
PREFILLED_SYRINGE | INTRAMUSCULAR | Status: AC
  Filled 2024-06-27: qty 15

## 2024-06-27 MED ORDER — OXYCODONE HCL 5 MG PO TABS: 5.0000 mg | ORAL_TABLET | Freq: Once | ORAL | Status: DC | PRN

## 2024-06-27 MED ORDER — ONDANSETRON HCL 4 MG/2ML IJ SOLN: 4.0000 mg | Freq: Once | INTRAMUSCULAR | Status: DC | PRN

## 2024-06-27 MED ORDER — LIDOCAINE 2% (20 MG/ML) 5 ML SYRINGE
INTRAMUSCULAR | Status: DC | PRN
  Administered 2024-06-27: 100 mg via INTRAVENOUS

## 2024-06-27 NOTE — Progress Notes (Signed)
" ° ° °  PROCEDURAL EXPEDITER PROGRESS NOTE  Patient Name: [name]  DOB:1976/03/18 Date of Admission: 06/26/2024  Date of Assessment:06/27/24   -------------------------------------------------------------------------------------------------------------------   Brief clinical summary: pt is a 49yr old female going to surgery on 06/27/24 for Insertion of a intramedullary rod , tibia-right.   Orders in place:  Yes   Communication with surgical team if no orders: n/a  Labs, test, and orders reviewed: yes  Requires surgical clearance:  No  What type of clearance: n/a   Clearance received: n/a  Barriers noted:n/a   Intervention provided by [facility] team: n/a  Barrier resolved:  not applicable   -------------------------------------------------------------------------------------------------------------------  [facility] Patient Care Command Expediter, [name] Please contact us  directly via secure chat (search for [facility]) or by calling us  at [phone] [facility]).  "

## 2024-06-27 NOTE — ED Notes (Addendum)
-  Spoke to pre-op, patients surgery is schedule for 12pm and needs to be there by 10am. -Called [facility] to advise of transportation need.

## 2024-06-27 NOTE — Progress Notes (Signed)
 Pt arrived to short stay from [facility] via [facility]. Consent laterality incorrect, called ortho trauma Francis Mt, PA-C--verbal order given for LEFT TIBIA. LEFT leg in cast.

## 2024-06-27 NOTE — Op Note (Signed)
 06/27/2024 3:03 PM  PATIENT:  [name] 49 y.o.   DATE OF BIRTH: 24-Apr-1976  MEDICAL RECORD NUMBER: [id number]  PRE-OPERATIVE DIAGNOSIS:  LEFT TIBIAL SHAFT FRACTURE  POST-OPERATIVE DIAGNOSIS:  LEFT TIBIAL SHAFT FRACTURE  PROCEDURE:  Procedure(s): LEFT TIBIA INTRAMEDULLARY NAILING with 9 X 375 mm Synthes, statically locked  SURGEON:  Surgeon(s) and Role:    DEWAINE Levy Sharper, MD - Primary  ASSISTANTS: Francis Mt, PA-C  ANESTHESIA:   none  EBL:  Minimal   BLOOD ADMINISTERED: None  DRAINS: None   LOCAL MEDICATIONS USED:  NONE  SPECIMEN:  No Specimen  DISPOSITION OF SPECIMEN:  N/A  COUNTS:  YES  TOURNIQUET:  * No tourniquets in log *  DICTATION: .Note written in EPIC  PLAN OF CARE: Admit to inpatient   PATIENT DISPOSITION:  PACU - hemodynamically stable.   Delay start of Pharmacological VTE agent (>24hrs) due to surgical blood loss or risk of bleeding: no  BRIEF SUMMARY AND INDICATIONS FOR PROCEDURE:  [name] is a 49 y.o. who sustained a tibia fracture from low energy fall or twisting incident with a soft pop and subsequent inability to bear weight. Patient denied increasing pain or paresthesia. I also discussed with the patient, her husband, and her daughter the risks and benefits of surgery, including the possibility of infection, nerve injury, vessel injury, wound breakdown, arthritis, symptomatic hardware, DVT/ PE, loss of motion, malunion, nonunion, heart attack, stroke, prolonged intubation, and need for further surgery among others. These risks were acknowledged and consent given to proceed.  BRIEF SUMMARY OF PROCEDURE:  The patient was taken to the operating room after administration of Ancef  for antibiotics.  The operative extremity was prepped and draped in the usual fashion.  No tourniquet was used during the procedure.  A 2.5-cm incision was made at the base of the distal pole of patella and extended proximally. A medial parapatellar incision was  made, and then the curved cannulated awl advanced into the center of the proximal tibia just medial to the lateral tibial spine and just anterior to the joint surface.  A guidewire was then advanced across the fracture site into the middle of the plafond and checked on AP and LAT images, measuring for nail length on the lateral.  Reduction was fine-tuned with a percutaneously placed large Weber clamp.  We then performed sequential reaming, encountering chatter at 9.0 mm, reaming up to 10 mm and placing a 9 x 375 mm nail. We were careful to watch alignment throughout and make sure distal locking bolts were anterior to the fibula. After placing both the distal locks. Two proximal locks were placed off the jig and checked for position and length.  Because the more proximal of the distal locks was nearly adjacent to the fracture line placement of an additional anteromedial to posterior lateral screw was indicated.  A open exposure of the anterior cortex was performed to carefully retract the neurovascular bundle and avoid injury.  This screw was checked for position on both AP and lateral views, as well.  An assistant was required for the procedure as my assistant performed the reaming and proximal instrumentation while I held reduction.   Standard layered closure was performed. Francis Mt, PA-C assisted during reaming and nail placement, as well as wound closure.  The patient was taken to the PACU in stable condition after application of sterile gently compressive dressings.  PROGNOSIS:  The patient will be weightbearing as tolerated with unrestricted motion of the knee and ankle  for the next 6 weeks. CAM boot for support as needed.  Weight-based Lovenox  for DVT prophylaxis. F/u in the office in 10-14 days for removal of sutures.   [name], M.D.

## 2024-06-27 NOTE — ED Notes (Signed)
 Pt made aware of need for urine sample. Pt unable to provide at this time.

## 2024-06-27 NOTE — Progress Notes (Addendum)
"    Orthopaedic Trauma Service   Pt on OR schedule for about 12:45 this afternoon  CT of L ankle ordered last night to eval for possible extension into the posterior malleolus given mechanism.  CT still has not been done.  I have changed the order to STAT and communicated with RN.   This is a preop CT   Plan is still for IMN of L tibia to address L tibia fracture   Chart was also reviewed.  Of note pt is on oxycodone  20 mg 5x a day. Most recent Rx was filled on 06/08/2024.  She has been on this regimen at least 2 years from the records I was able to review. Appears she is an active patient at [facility] FNP).   With that said pain management may be a bit challenging.  Will maximize non-narcotic agents and will add something for breakthrough.  Her chronic opioid use ( and her chronic adderall use for ADHD) does increase her risk of nonunion as well   Francis [name], PA-C [phone] (C) 06/27/2024, 9:16 AM  [facility] [address] [place] [address] [phone] (F)       Patient ID: [name], female   DOB: 12/31/1975, 49 y.o.   MRN: [id number]  "

## 2024-06-27 NOTE — Anesthesia Procedure Notes (Addendum)
 Procedure Name: Intubation Date/Time: 06/27/2024 12:44 PM  Performed by: Jazelle Achey L, CRNAPre-anesthesia Checklist: Patient identified, Emergency Drugs available, Suction available and Patient being monitored Patient Re-evaluated:Patient Re-evaluated prior to induction Oxygen Delivery Method: Circle system utilized Preoxygenation: Pre-oxygenation with 100% oxygen Induction Type: IV induction Ventilation: Mask ventilation without difficulty and Oral airway inserted - appropriate to patient size Laryngoscope Size: Mac and 3 Grade View: Grade II Tube type: Oral Tube size: 7.0 mm Number of attempts: 1 Airway Equipment and Method: Stylet and Oral airway Placement Confirmation: ETT inserted through vocal cords under direct vision, positive ETCO2 and breath sounds checked- equal and bilateral Secured at: 22 cm Tube secured with: Tape Dental Injury: Teeth and Oropharynx as per pre-operative assessment

## 2024-06-27 NOTE — Transfer of Care (Signed)
 Immediate Anesthesia Transfer of Care Note  Patient: [name]  Procedure(s) Performed: INSERTION, INTRAMEDULLARY ROD, TIBIA (Left: Leg Lower)  Patient Location: PACU  Anesthesia Type:General  Level of Consciousness: sedated and responds to stimulation  Airway & Oxygen Therapy: Patient Spontanous Breathing and Patient connected to face mask oxygen  Post-op Assessment: Report given to RN, Post -op Vital signs reviewed and stable, and Patient moving all extremities  Post vital signs: Reviewed and stable  Last Vitals:  Vitals Value Taken Time  BP 126/72 06/27/24 14:45  Temp 36.4 C 06/27/24 14:45  Pulse 80 06/27/24 14:47  Resp 7 06/27/24 14:47  SpO2 94 % 06/27/24 14:47  Vitals shown include unfiled device data.  Last Pain:  Vitals:   06/27/24 1037  TempSrc:   PainSc: 9       Patients Stated Pain Goal: 3 (06/27/24 1037)  Complications:  Encounter Notable Events  Notable Event Outcome Phase Comment  Laryngospasm Resolved in Lab Intraprocedure

## 2024-06-27 NOTE — Progress Notes (Signed)
 Orthopedic Tech Progress Note Patient Details:  [name] [birth year] [id number]  In house FRAMES WITH TRAPEZE weight limit is 113KG (250LBS) patient is currently weighing in at 124.7KG (274.9LBS), at this time patient can not use in house frame.  Patient ID: [name], female   DOB: 1975-08-30, 49 y.o.   MRN: [id number]  [name] Pac 06/27/2024, 3:36 PM

## 2024-06-28 ENCOUNTER — Encounter (HOSPITAL_COMMUNITY): Payer: Self-pay | Admitting: Orthopedic Surgery

## 2024-06-28 LAB — CBC
HCT: 35.1 % — ABNORMAL LOW (ref 36.0–46.0)
Hemoglobin: 10.9 g/dL — ABNORMAL LOW (ref 12.0–15.0)
MCH: 21.9 pg — ABNORMAL LOW (ref 26.0–34.0)
MCHC: 31.1 g/dL (ref 30.0–36.0)
MCV: 70.6 fL — ABNORMAL LOW (ref 80.0–100.0)
Platelets: 184 10*3/uL (ref 150–400)
RBC: 4.97 MIL/uL (ref 3.87–5.11)
RDW: 14.9 % (ref 11.5–15.5)
WBC: 8.7 10*3/uL (ref 4.0–10.5)
nRBC: 0 % (ref 0.0–0.2)

## 2024-06-28 LAB — MISC LABCORP TEST (SEND OUT): Labcorp test code: 83935

## 2024-06-28 LAB — VITAMIN D 25 HYDROXY (VIT D DEFICIENCY, FRACTURES): Vit D, 25-Hydroxy: 20.8 ng/mL — ABNORMAL LOW (ref 30–100)

## 2024-06-28 MED ORDER — VITAMIN D 25 MCG (1000 UNIT) PO TABS
2000.0000 [IU] | ORAL_TABLET | Freq: Two times a day (BID) | ORAL | Status: DC
  Administered 2024-06-28 – 2024-06-30 (×4): 2000 [IU] via ORAL
  Filled 2024-06-28 (×4): qty 2

## 2024-06-28 NOTE — TOC CAGE-AID Note (Signed)
 Transition of Care [facility]) - CAGE-AID Screening   Patient Details  Name: [name] MRN: [id number] Date of Birth: 1976-01-28  [facility] of [facility]) CM/SW Contact:    Stacey Maura E Lenoir Facchini, LCSW Phone Number: 06/28/2024, 9:17 AM   Clinical Narrative: No SA noted.   CAGE-AID Screening:    Have You Ever Felt You Ought to Cut Down on Your Drinking or Drug Use?: No Have People Annoyed You By Critizing Your Drinking Or Drug Use?: No Have You Felt Bad Or Guilty About Your Drinking Or Drug Use?: No Have You Ever Had a Drink or Used Drugs First Thing In The Morning to Steady Your Nerves or to Get Rid of a Hangover?: No CAGE-AID Score: 0  Substance Abuse Education Offered: No

## 2024-06-28 NOTE — Anesthesia Postprocedure Evaluation (Signed)
"   Anesthesia Post Note  Patient: [name]  Procedure(s) Performed: INSERTION, INTRAMEDULLARY ROD, TIBIA (Left: Leg Lower)     Patient location during evaluation: PACU Anesthesia Type: General Level of consciousness: awake and alert Pain management: pain level controlled Vital Signs Assessment: post-procedure vital signs reviewed and stable Respiratory status: spontaneous breathing, nonlabored ventilation, respiratory function stable and patient connected to nasal cannula oxygen Cardiovascular status: blood pressure returned to baseline and stable Postop Assessment: no apparent nausea or vomiting Anesthetic complications: yes   Encounter Notable Events  Notable Event Outcome Phase Comment  Laryngospasm Resolved in Lab Intraprocedure     Last Vitals:  Vitals:   06/28/24 0200 06/28/24 0724  BP: 105/64 103/76  Pulse: 95 90  Resp: 18 16  Temp: 36.9 C 36.7 C  SpO2: 99% 100%    Last Pain:  Vitals:   06/28/24 0513  TempSrc:   PainSc: 0-No pain                 Alayla Dethlefs      "

## 2024-06-28 NOTE — Plan of Care (Signed)

## 2024-06-28 NOTE — Evaluation (Signed)
 Physical Therapy Evaluation Patient Details Name: [name] MRN: [id number] DOB: [birth year] Today's Date: 06/28/2024  History of Present Illness  Pt is a 49 y.o. F who presents 06/26/2024 after slipping with left tibial shift fracture now s/p intramedullary nailing 2/2. Significant PMH: ADHD, bipolar disorder, chronic pain, depression, obesity.  Clinical Impression  Pt s/p procedure listed above. Pt received up in chair, where she had tolerated sitting a little over an hour. She is very lethargic, but overall is participatory and can follow commands. Pt spouse at bedside and supportive. Pt requiring min assist for transfers and ambulating ~5 ft over to bed with RW with overall good adherence to weightbearing precautions and offloading LLE. Pt reports pain and nausea. She was premedicated prior to session and RN present to provide nausea medication after calling. Suspect good progress based on pt age and PLOF.       If plan is discharge home, recommend the following: A little help with walking and/or transfers;A little help with bathing/dressing/bathroom;Assistance with cooking/housework;Help with stairs or ramp for entrance;Assist for transportation   Can travel by private vehicle        Equipment Recommendations Rolling walker (2 wheels);Wheelchair (measurements PT);BSC/3in1 (bariatric equipment)  Recommendations for Other Services       Functional Status Assessment Patient has had a recent decline in their functional status and demonstrates the ability to make significant improvements in function in a reasonable and predictable amount of time.     Precautions / Restrictions Precautions Precautions: Fall Recall of Precautions/Restrictions: Impaired Required Braces or Orthoses: Other Brace Other Brace: CAM boot Restrictions Weight Bearing Restrictions Per Provider Order: Yes LLE Weight Bearing Per Provider Order: Touchdown weight bearing      Mobility  Bed Mobility Overal bed  mobility: Needs Assistance Bed Mobility: Sit to Supine       Sit to supine: Min assist   General bed mobility comments: MinA for LLE management back into bed    Transfers Overall transfer level: Needs assistance Equipment used: Rolling walker (2 wheels) Transfers: Sit to/from Stand Sit to Stand: Min assist           General transfer comment: MinA to power up to stand from chair    Ambulation/Gait Ambulation/Gait assistance: Contact guard assist Gait Distance (Feet): 5 Feet Assistive device: Rolling walker (2 wheels) Gait Pattern/deviations: Step-to pattern, Antalgic Gait velocity: decreased     General Gait Details: Good offloading from LLE and use of upper extremities on RW  Stairs            Wheelchair Mobility     Tilt Bed    Modified Rankin (Stroke Patients Only)       Balance Overall balance assessment: Needs assistance Sitting-balance support: Feet supported Sitting balance-Leahy Scale: Good     Standing balance support: Reliant on assistive device for balance Standing balance-Leahy Scale: Fair                               Pertinent Vitals/Pain Pain Assessment Pain Assessment: Faces Faces Pain Scale: Hurts even more Pain Location: L leg Pain Descriptors / Indicators: Tender, Grimacing Pain Intervention(s): Limited activity within patient's tolerance, Monitored during session, Premedicated before session, Repositioned    Home Living Family/patient expects to be discharged to:: Private residence Living Arrangements: Spouse/significant other;Children Available Help at Discharge: Family;Available 24 hours/day Type of Home: House Home Access: Stairs to enter Entrance Stairs-Rails: None Entrance Stairs-Number of Steps: Threshold  Home Layout: One level Home Equipment: None      Prior Function Prior Level of Function : Independent/Modified Independent;Driving                     Extremity/Trunk Assessment    Upper Extremity Assessment Upper Extremity Assessment: Overall WFL for tasks assessed    Lower Extremity Assessment Lower Extremity Assessment: LLE deficits/detail LLE Deficits / Details: CAM boot donned. Able to perform limited LAQ    Cervical / Trunk Assessment Cervical / Trunk Assessment: Other exceptions Cervical / Trunk Exceptions: Body Habitus  Communication   Communication Communication: No apparent difficulties    Cognition Arousal: Lethargic Behavior During Therapy: Flat affect   PT - Cognitive impairments: Difficult to assess Difficult to assess due to: Level of arousal                       Following commands: Impaired Following commands impaired: Follows one step commands with increased time     Cueing Cueing Techniques: Verbal cues     General Comments      Exercises     Assessment/Plan    PT Assessment Patient needs continued PT services  PT Problem List Decreased strength;Decreased balance;Decreased activity tolerance;Decreased mobility;Pain       PT Treatment Interventions DME instruction;Gait training;Functional mobility training;Therapeutic activities;Therapeutic exercise;Balance training;Patient/family education    PT Goals (Current goals can be found in the Care Plan section)  Acute Rehab PT Goals Patient Stated Goal: did not state PT Goal Formulation: With patient/family Time For Goal Achievement: 07/12/24 Potential to Achieve Goals: Good    Frequency Min 2X/week     Co-evaluation               AM-PAC PT 6 Clicks Mobility  Outcome Measure Help needed turning from your back to your side while in a flat bed without using bedrails?: A Little Help needed moving from lying on your back to sitting on the side of a flat bed without using bedrails?: A Little Help needed moving to and from a bed to a chair (including a wheelchair)?: A Little Help needed standing up from a chair using your arms (e.g., wheelchair or bedside  chair)?: A Little Help needed to walk in hospital room?: A Little Help needed climbing 3-5 steps with a railing? : Total 6 Click Score: 16    End of Session   Activity Tolerance: Patient tolerated treatment well Patient left: in bed;with call bell/phone within reach;with family/visitor present   PT Visit Diagnosis: Pain;Difficulty in walking, not elsewhere classified (R26.2) Pain - Right/Left: Left Pain - part of body: Leg    Time: 1249-1317 PT Time Calculation (min) (ACUTE ONLY): 28 min   Charges:   PT Evaluation $PT Eval Low Complexity: 1 Low PT Treatments $Therapeutic Activity: 8-22 mins PT General Charges $$ ACUTE PT VISIT: 1 Visit         Aleck Daring, PT, DPT Acute Rehabilitation Services Office [phone]   Aleck ONEIDA Daring 06/28/2024, 3:43 PM

## 2024-06-29 LAB — CBC
HCT: 33.8 % — ABNORMAL LOW (ref 36.0–46.0)
Hemoglobin: 10.5 g/dL — ABNORMAL LOW (ref 12.0–15.0)
MCH: 22.1 pg — ABNORMAL LOW (ref 26.0–34.0)
MCHC: 31.1 g/dL (ref 30.0–36.0)
MCV: 71.2 fL — ABNORMAL LOW (ref 80.0–100.0)
Platelets: 191 10*3/uL (ref 150–400)
RBC: 4.75 MIL/uL (ref 3.87–5.11)
RDW: 14.7 % (ref 11.5–15.5)
WBC: 7.6 10*3/uL (ref 4.0–10.5)
nRBC: 0 % (ref 0.0–0.2)

## 2024-06-29 NOTE — Plan of Care (Signed)
 Patient is calm.   Problem: Health Behavior/Discharge Planning: Goal: Ability to manage health-related needs will improve Outcome: Progressing   Problem: Clinical Measurements: Goal: Will remain free from infection Outcome: Progressing   Problem: Activity: Goal: Risk for activity intolerance will decrease Outcome: Progressing   Problem: Coping: Goal: Level of anxiety will decrease Outcome: Progressing   Problem: Pain Managment: Goal: General experience of comfort will improve and/or be controlled Outcome: Progressing   Problem: Safety: Goal: Ability to remain free from injury will improve Outcome: Progressing   Problem: Skin Integrity: Goal: Risk for impaired skin integrity will decrease Outcome: Progressing

## 2024-06-29 NOTE — Progress Notes (Cosign Needed)
 "                                                                         [facility] Progress Note  Patient ID: [name] MRN: [id number] DOB/AGE: [birth year] 49 y.o.  Subjective:  Resting comfortably this morning Did reasonably well with therapy yesterday  Would like to work with therapy another day and then plan for discharge home tomorrow.  Feel that this is completely reasonable  No other complaints noted  Patient remains under her total MME's based off her chronic pain regimen.  A prescription is oxycodone  20 mg 5 times a day which she equates to 150 MME's.  Yesterday she received 91.5 MME's  ROS As above Today's  total administered Morphine  Milligram Equivalents: 27 Yesterday's total administered Morphine  Milligram Equivalents: 91.5  Objective:   VITALS:   Vitals:   06/28/24 1422 06/28/24 2001 06/29/24 0524 06/29/24 0819  BP: 116/68 104/66 101/61 101/65  Pulse: 97 95 93 94  Resp: 18 17 19 18   Temp: 99.2 F (37.3 C) 98.8 F (37.1 C) 99.3 F (37.4 C) 98.4 F (36.9 C)  TempSrc:  Oral Oral   SpO2: 95% 98% 92% 99%  Weight:      Height:        Estimated body mass index is 44.37 kg/m as calculated from the following:   Height as of this encounter: 5' 6 (1.676 m).   Weight as of this encounter: 124.7 kg.   Intake/Output      02/03 0701 02/04 0700 02/04 0701 02/05 0700   P.O. 120 120   I.V. (mL/kg)     IV Piggyback     Total Intake(mL/kg) 120 (1) 120 (1)   Urine (mL/kg/hr)  800 (1.8)   Total Output  800   Net +120 -680          LABS  Results for orders placed or performed during the hospital encounter of 06/26/24 (from the past 24 hours)  CBC     Status: Abnormal   Collection Time: 06/29/24  6:06 AM  Result Value Ref Range   WBC 7.6 4.0 - 10.5 K/uL   RBC 4.75 3.87 - 5.11 MIL/uL   Hemoglobin 10.5 (L) 12.0 - 15.0 g/dL   HCT 66.1 (L) 63.9 - 53.9 %   MCV 71.2 (L) 80.0 - 100.0 fL   MCH 22.1 (L) 26.0 - 34.0 pg   MCHC 31.1 30.0 -  36.0 g/dL   RDW 85.2 88.4 - 84.4 %   Platelets 191 150 - 400 K/uL   nRBC 0.0 0.0 - 0.2 %     PHYSICAL EXAM:   Gen: resting comfortably in bed, NAD Lungs: unlabored Cardiac: reg Ext:       Left Lower Extremity CAM boot in place  Dressing is clean, dry and intact             Extremity is warm             No DCT             Compartments are soft             No pain out of proportion with passive stretching of toes or ankle  DPN, SPN, TN sensory functions are intact             Ankle flexion, extension, inversion eversion intact             + DP pulse  Assessment/Plan: 2 Days Post-Op   Principal Problem:   Fibula fracture Active Problems:   Closed fracture of distal end of right tibia   Anti-infectives (From admission, onward)    Start     Dose/Rate Route Frequency Ordered Stop   06/27/24 1024  ceFAZolin  (ANCEF ) 3-4 GM/150ML-% IVPB       Note to Pharmacy: Coni Sensor M: cabinet override      06/27/24 1024 06/27/24 1253   06/27/24 0745  ceFAZolin  (ANCEF ) IVPB 3g/150 mL premix        3 g 300 mL/hr over 30 Minutes Intravenous On call to O.R. 06/27/24 9268 06/27/24 1315     .  POD/HD#: 62  49 year old female with low energy fracture to left distal tibial shaft, chronic pain management   - Low energy fracture left tibia s/p intramedullary nailing of left tibia             Touchdown weightbearing left leg with Cam boot             Unrestricted range of motion of left knee and ankle             Cam boot only needs to be on when mobilizing otherwise it may be off to allow for range of motion exercises               Dressing changes prior to discharge               Ice and elevate for swelling and pain control.  Move toes and ankle to help with swelling control as well.               PT and OT evaluations               Plan for discharge home tomorrow   - Pain management:             Multimodal             Remains under her reported allowable MME's.   Continue with current management.  Do not anticipate discharging with any additional narcotic prescriptions.  Her last prescription was filled on 06/08/2024 which shows a 30-day supply of her oxycodone  20 mg tablets   - ABL anemia/Hemodynamics             Stable   - Medical issues              Chronic pain on chronic opioids                         Home regimen ordered on as needed basis                - DVT/PE prophylaxis:             Lovenox  while inpatient and Eliquis  at discharge   - ID:              Perioperative antibiotics completed   - Metabolic Bone Disease:             Likely multifactorial.  Bone very soft clinically.  Suspect her chronic opioid use is contributing             Supplement vitamin D  and  optimize nutrition   - Activity:             As above   - FEN/GI prophylaxis/Foley/Lines:             Regular diet   - Impediments to fracture healing:             Vitamin D  deficiency             Long-term opioid use for chronic pain   - Dispo:             Continue with therapies  Plan for discharge home tomorrow    Francis [name], PA-C [phone] (C) 06/29/2024, 10:31 AM  [facility] [address] [place] [address] [phone] [phone] (F)    After 5pm and on the weekends please log on to Amion, go to orthopaedics and the look under the [facility] Call for the provider(s) on call. You can also call our office at [phone] and then follow the prompts to be connected to the call team.  Patient ID: [name], female   DOB: 01-28-1976, 49 y.o.   MRN: [id number]  "

## 2024-06-29 NOTE — Progress Notes (Signed)
 Physical Therapy Treatment Patient Details Name: [name] MRN: [id number] DOB: April 24, 1976 Today's Date: 06/29/2024   History of Present Illness Pt is a 49 y.o. F who presents 06/26/2024 after slipping with left tibial shift fracture now s/p intramedullary nailing 2/2. Significant PMH: ADHD, bipolar disorder, chronic pain, depression, obesity.    PT Comments  Pt with increased alertness today in comparison to yesterday and is agreeable for participation. Session focused on instruction of LLE HEP (texted program to patient), transfer and gait training and demonstration of car transfer technique. Pt ambulating ~20 ft with RW with slowed step to pattern. Displays good adherence to weightbearing precautions. Also propelling w/c with bilateral upper extremities a limited hallway distance. Plan for d/c home tomorrow per PA.     If plan is discharge home, recommend the following: A little help with walking and/or transfers;A little help with bathing/dressing/bathroom;Assistance with cooking/housework;Help with stairs or ramp for entrance;Assist for transportation   Can travel by private vehicle        Equipment Recommendations  Rolling walker (2 wheels);Wheelchair (measurements PT);BSC/3in1 (bariatric equipment)    Recommendations for Other Services       Precautions / Restrictions Precautions Precautions: Fall Recall of Precautions/Restrictions: Impaired Required Braces or Orthoses: Other Brace Other Brace: CAM boot on when mobilizing Restrictions Weight Bearing Restrictions Per Provider Order: Yes LLE Weight Bearing Per Provider Order: Touchdown weight bearing     Mobility  Bed Mobility Overal bed mobility: Needs Assistance Bed Mobility: Supine to Sit     Supine to sit: Min assist     General bed mobility comments: MinA for LLE management out of bed, use of pillow to slide across    Transfers Overall transfer level: Needs assistance Equipment used: Rolling walker (2  wheels) Transfers: Sit to/from Stand Sit to Stand: Contact guard assist           General transfer comment: Pt able to stand from bed and w/c level without physical assist. Increased time and cues to kick LLE out prior to transition    Ambulation/Gait Ambulation/Gait assistance: Contact guard assist Gait Distance (Feet): 20 Feet Assistive device: Rolling walker (2 wheels) Gait Pattern/deviations: Step-to pattern, Antalgic Gait velocity: decreased Gait velocity interpretation: <1.31 ft/sec, indicative of household ambulator   General Gait Details: Good offloading from LLE and use of upper extremities on RW. Slow and effortful   Psychologist, Counselling mobility: Yes Wheelchair propulsion: Both upper extremities Wheelchair parts: Supervision/cueing Distance: 30   Tilt Bed    Modified Rankin (Stroke Patients Only)       Balance Overall balance assessment: Needs assistance Sitting-balance support: Feet supported Sitting balance-Leahy Scale: Good     Standing balance support: Reliant on assistive device for balance Standing balance-Leahy Scale: Fair                              Hotel Manager: No apparent difficulties  Cognition Arousal: Alert Behavior During Therapy: WFL for tasks assessed/performed   PT - Cognitive impairments: No apparent impairments Difficult to assess due to: Level of arousal                       Following commands: Intact      Cueing Cueing Techniques: Verbal cues  Exercises      General Comments  Pertinent Vitals/Pain Pain Assessment Pain Assessment: 0-10 Pain Score: 8  Pain Location: L leg Pain Descriptors / Indicators: Tender, Grimacing Pain Intervention(s): Monitored during session, RN gave pain meds during session, Limited activity within patient's tolerance    Home Living                           Prior Function            PT Goals (current goals can now be found in the care plan section) Acute Rehab PT Goals Patient Stated Goal: did not state PT Goal Formulation: With patient/family Time For Goal Achievement: 07/12/24 Potential to Achieve Goals: Good Progress towards PT goals: Progressing toward goals    Frequency    Min 2X/week      PT Plan      Co-evaluation              AM-PAC PT 6 Clicks Mobility   Outcome Measure  Help needed turning from your back to your side while in a flat bed without using bedrails?: A Little Help needed moving from lying on your back to sitting on the side of a flat bed without using bedrails?: A Little Help needed moving to and from a bed to a chair (including a wheelchair)?: A Little Help needed standing up from a chair using your arms (e.g., wheelchair or bedside chair)?: A Little Help needed to walk in hospital room?: A Little Help needed climbing 3-5 steps with a railing? : Total 6 Click Score: 16    End of Session Equipment Utilized During Treatment: Gait belt;Other (comment) (CAM boot) Activity Tolerance: Patient tolerated treatment well Patient left: in bed;with call bell/phone within reach;with family/visitor present Nurse Communication: Mobility status PT Visit Diagnosis: Pain;Difficulty in walking, not elsewhere classified (R26.2) Pain - Right/Left: Left Pain - part of body: Leg     Time: 1029-1110 PT Time Calculation (min) (ACUTE ONLY): 41 min  Charges:    $Therapeutic Activity: 38-52 mins PT General Charges $$ ACUTE PT VISIT: 1 Visit                     Aleck Daring, PT, DPT Acute Rehabilitation Services Office [phone]    Aleck ONEIDA Daring 06/29/2024, 12:02 PM

## 2024-06-30 ENCOUNTER — Other Ambulatory Visit (HOSPITAL_COMMUNITY): Payer: Self-pay

## 2024-06-30 ENCOUNTER — Encounter (HOSPITAL_COMMUNITY): Payer: Self-pay | Admitting: Orthopedic Surgery

## 2024-06-30 DIAGNOSIS — F1190 Opioid use, unspecified, uncomplicated: Secondary | ICD-10-CM | POA: Diagnosis present

## 2024-06-30 MED ORDER — VITAMIN C 500 MG PO TABS: 1000.0000 mg | ORAL_TABLET | Freq: Every day | ORAL | Status: DC

## 2024-06-30 MED ORDER — APIXABAN 2.5 MG PO TABS
2.5000 mg | ORAL_TABLET | Freq: Two times a day (BID) | ORAL | 0 refills | Status: AC
  Filled 2024-06-30: qty 60, 30d supply, fill #0

## 2024-06-30 MED ORDER — VITAMIN D3 125 MCG (5000 UT) PO TABS
1.0000 | ORAL_TABLET | Freq: Every day | ORAL | 6 refills | Status: AC
  Filled 2024-06-30: qty 30, 30d supply, fill #0

## 2024-06-30 MED ORDER — ASCORBIC ACID 1000 MG PO TABS
1000.0000 mg | ORAL_TABLET | Freq: Every day | ORAL | 1 refills | Status: AC
  Filled 2024-06-30: qty 30, 30d supply, fill #0

## 2024-06-30 MED ORDER — DOCUSATE SODIUM 100 MG PO CAPS
100.0000 mg | ORAL_CAPSULE | Freq: Two times a day (BID) | ORAL | 0 refills | Status: AC
  Filled 2024-06-30: qty 28, 14d supply, fill #0

## 2024-06-30 MED ORDER — METHOCARBAMOL 500 MG PO TABS
500.0000 mg | ORAL_TABLET | Freq: Three times a day (TID) | ORAL | 0 refills | Status: AC | PRN
  Filled 2024-06-30: qty 100, 17d supply, fill #0

## 2024-06-30 MED ORDER — NALOXONE HCL 4 MG/0.1ML NA LIQD
NASAL | 0 refills | Status: AC
  Filled 2024-06-30: qty 2, fill #0

## 2024-06-30 MED ORDER — ACETAMINOPHEN 500 MG PO TABS
1000.0000 mg | ORAL_TABLET | Freq: Three times a day (TID) | ORAL | 0 refills | Status: AC | PRN
  Filled 2024-06-30: qty 60, 10d supply, fill #0

## 2024-06-30 NOTE — Discharge Instructions (Addendum)
 "  Orthopaedic Trauma Service Discharge Instructions   General Discharge Instructions  Orthopaedic Injuries:  Left tibia fracture treated with intramedullary nailing  WEIGHT BEARING STATUS: Touchdown weightbearing left leg with Cam boot and crutches/walker  RANGE OF MOTION/ACTIVITY: Unrestricted range of motion of left knee and ankle.  Okay to come out of your cam boot is much as possible to work on motion.  Cam boot only needs to be on when ambulating  Bone health: Labs show vitamin D  deficiency.  Please take vitamin D  that is been prescribed for you  Review the following resource for additional information regarding bone health  [email or web address]  Wound Care: Wound care starting on 07/03/2024.  Please see instructions below Discharge Wound Care Instructions  Do NOT apply any ointments, solutions or lotions to pin sites or surgical wounds.  These prevent needed drainage and even though solutions like hydrogen peroxide kill bacteria, they also damage cells lining the pin sites that help fight infection.  Applying lotions or ointments can keep the wounds moist and can cause them to breakdown and open up as well. This can increase the risk for infection. When in doubt call the office.  Surgical incisions should be dressed daily.  If any drainage is noted, use one layer of adaptic or Mepitel, then gauze, Kerlix, and an ace wrap.  Instead of gauze you can use silicone foam dressing to go directly on the surgical sites.  Once you can tolerate would use a compression sock instead of an Ace wrap for swelling control  [email or web address]  [email or web address]  These dressing supplies should be available at local medical supply stores ([employer], [facility] [employer], etc). They are not usually carried at places like [facility], [facility], [facility], etc  Once the incision is completely dry and without drainage, it may be left open to air out.  Showering may begin 36-48 hours later.  Cleaning gently with soap and water. .  DVT/PE prophylaxis: Eliquis  2.5 mg every 12 hours for 30 days for blood clot prevention  Diet: as you were eating previously.  Can use over the counter stool softeners and bowel preparations, such as Miralax , to help with bowel movements.  Narcotics can be constipating.  Be sure to drink plenty of fluids  PAIN MEDICATION USE AND EXPECTATIONS  You have likely been given narcotic medications to help control your pain.  After a traumatic event that results in an fracture (broken bone) with or without surgery, it is ok to use narcotic pain medications to help control one's pain.  We understand that everyone responds to pain differently and each individual patient will be evaluated on a regular basis for the continued need for narcotic medications. Ideally, narcotic medication use should last no more than 6-8 weeks (coinciding with fracture healing).   As a patient it is your responsibility as well to monitor narcotic medication use and report the amount and frequency you use these medications when you come to your office visit.   We would also advise that if you are using narcotic medications, you should take a dose prior to therapy to maximize you participation.  IF YOU ARE ON NARCOTIC MEDICATIONS IT IS NOT PERMISSIBLE TO OPERATE A MOTOR VEHICLE (MOTORCYCLE/CAR/TRUCK/MOPED) OR HEAVY MACHINERY DO NOT MIX NARCOTICS WITH OTHER CNS (CENTRAL NERVOUS SYSTEM) DEPRESSANTS SUCH AS ALCOHOL    POST-OPERATIVE OPIOID TAPER INSTRUCTIONS: It is important to wean off of your opioid medication as soon as possible. If you do not need pain medication after your surgery it is ok  to stop day one. Opioids include: Codeine , Hydrocodone (Norco,  Vicodin), Oxycodone (Percocet, oxycontin ) and hydromorphone  amongst others.  Long term and even short term use of opiods can cause: Increased pain response Dependence Constipation Depression Respiratory depression And more.  Withdrawal symptoms can include Flu like symptoms Nausea, vomiting And more Techniques to manage these symptoms Hydrate well Eat regular healthy meals Stay active Use relaxation techniques(deep breathing, meditating, yoga) Do Not substitute Alcohol  to help with tapering If you have been on opioids for less than two weeks and do not have pain than it is ok to stop all together.  Plan to wean off of opioids This plan should start within one week post op of your fracture surgery  Maintain the same interval or time between taking each dose and first decrease the dose.  Cut the total daily intake of opioids by one tablet each day Next start to increase the time between doses. The last dose that should be eliminated is the evening dose.    STOP SMOKING OR USING NICOTINE PRODUCTS!!!!  As discussed nicotine severely impairs your body's ability to heal surgical and traumatic wounds but also impairs bone healing.  Wounds and bone heal by forming microscopic blood vessels (angiogenesis) and nicotine is a vasoconstrictor (essentially, shrinks blood vessels).  Therefore, if vasoconstriction occurs to these microscopic blood vessels they essentially disappear and are unable to deliver necessary nutrients to the healing tissue.  This is one modifiable factor that you can do to dramatically increase your chances of healing your injury.    (This means no smoking, no nicotine gum, patches, etc)  DO NOT USE NONSTEROIDAL ANTI-INFLAMMATORY DRUGS (NSAID'S)  Using products such as Advil  (ibuprofen ), Aleve  (naproxen ), Motrin  (ibuprofen ) for additional pain control during fracture healing can delay and/or prevent the healing response.  If you would like to take over the counter (OTC)  medication, Tylenol  (acetaminophen ) is ok.  However, some narcotic medications that are given for pain control contain acetaminophen  as well. Therefore, you should not exceed more than 4000 mg of tylenol  in a day if you do not have liver disease.  Also note that there are may OTC medicines, such as cold medicines and allergy medicines that my contain tylenol  as well.  If you have any questions about medications and/or interactions please ask your doctor/PA or your pharmacist.      ICE AND ELEVATE INJURED/OPERATIVE EXTREMITY  Using ice and elevating the injured extremity above your heart can help with swelling and pain control.  Icing in a pulsatile fashion, such as 20 minutes on and 20 minutes off, can be followed.    Do not place ice directly on skin. Make sure there is a barrier between to skin and the ice pack.    Using frozen items such as frozen peas works well as the conform nicely to the are that needs to be iced.  USE AN ACE WRAP OR TED HOSE FOR SWELLING CONTROL  In addition to icing and elevation, Ace wraps or TED hose are used to help limit and resolve swelling.  It is recommended to use Ace wraps or TED hose until you are informed to stop.    When using Ace Wraps start the wrapping distally (farthest away from the body) and wrap proximally (closer to the body)   Example: If you had surgery on your leg and you do not have a splint on, start the ace wrap at the toes and work your way up to the thigh  If you had surgery on your upper extremity and do not have a splint on, start the ace wrap at your fingers and work your way up to the upper arm  IF YOU ARE IN A SPLINT OR CAST DO NOT REMOVE IT FOR ANY REASON   If your splint gets wet for any reason please contact the office immediately. You may shower in your splint or cast as long as you keep it dry.  This can be done by wrapping in a cast cover or garbage back (or similar)  Do Not stick any thing down your splint or cast such as  pencils, money, or hangers to try and scratch yourself with.  If you feel itchy take benadryl  as prescribed on the bottle for itching  IF YOU ARE IN A CAM BOOT (BLACK BOOT)  You may remove boot periodically. Perform daily dressing changes as noted below.  Wash the liner of the boot regularly and wear a sock when wearing the boot. It is recommended that you sleep in the boot until told otherwise    Call office for the following: Temperature greater than 101F Persistent nausea and vomiting Severe uncontrolled pain Redness, tenderness, or signs of infection (pain, swelling, redness, odor or green/yellow discharge around the site) Difficulty breathing, headache or visual disturbances Hives Persistent dizziness or light-headedness Extreme fatigue Any other questions or concerns you may have after discharge  In an emergency, call 911 or go to an Emergency Department at a nearby hospital  HELPFUL INFORMATION  If you had a block, it will wear off between 8-24 hrs postop typically.  This is period when your pain may go from nearly zero to the pain you would have had postop without the block.  This is an abrupt transition but nothing dangerous is happening.  You may take an extra dose of narcotic when this happens.  You should wean off your narcotic medicines as soon as you are able.  Most patients will be off or using minimal narcotics before their first postop appointment.   We suggest you use the pain medication the first night prior to going to bed, in order to ease any pain when the anesthesia wears off. You should avoid taking pain medications on an empty stomach as it will make you nauseous.  Do not drink alcoholic beverages or take illicit drugs when taking pain medications.  In most states it is against the law to drive while you are in a splint or sling.  And certainly against the law to drive while taking narcotics.  You may return to work/school in the next couple of days when you  feel up to it.   Pain medication may make you constipated.  Below are a few solutions to try in this order: Decrease the amount of pain medication if you arent having pain. Drink lots of decaffeinated fluids. Drink prune juice and/or each dried prunes  If the first 3 dont work start with additional solutions Take Colace - an over-the-counter stool softener Take Senokot - an over-the-counter laxative Take Miralax  - a stronger over-the-counter laxative     CALL THE OFFICE WITH ANY QUESTIONS OR CONCERNS: [phone]   VISIT OUR WEBSITE FOR ADDITIONAL INFORMATION: [email or web address]    Information on my medicine - ELIQUIS  (apixaban )  Why was Eliquis  prescribed for you? Eliquis  was prescribed for you to reduce the risk of blood clots forming after orthopedic surgery.    What do You need to know about Eliquis ? Take your Eliquis  TWICE DAILY -  one tablet in the morning and one tablet in the evening with or without food.  It would be best to take the dose about the same time each day.  If you have difficulty swallowing the tablet whole please discuss with your pharmacist how to take the medication safely.  Take Eliquis  exactly as prescribed by your doctor and DO NOT stop taking Eliquis  without talking to the doctor who prescribed the medication.  Stopping without other medication to take the place of Eliquis  may increase your risk of developing a clot.  After discharge, you should have regular check-up appointments with your healthcare provider that is prescribing your Eliquis .  What do you do if you miss a dose? If a dose of ELIQUIS  is not taken at the scheduled time, take it as soon as possible on the same day and twice-daily administration should be resumed.  The dose should not be doubled to make up for a missed dose.  Do not take more than one tablet of ELIQUIS  at the same time.  Important Safety Information A possible side effect of Eliquis  is bleeding. You should  call your healthcare provider right away if you experience any of the following: Bleeding from an injury or your nose that does not stop. Unusual colored urine (red or dark brown) or unusual colored stools (red or black). Unusual bruising for unknown reasons. A serious fall or if you hit your head (even if there is no bleeding).  Some medicines may interact with Eliquis  and might increase your risk of bleeding or clotting while on Eliquis . To help avoid this, consult your healthcare provider or pharmacist prior to using any new prescription or non-prescription medications, including herbals, vitamins, non-steroidal anti-inflammatory drugs (NSAIDs) and supplements.  This website has more information on Eliquis  (apixaban ): [email or web address]  "

## 2024-06-30 NOTE — Discharge Summary (Cosign Needed Addendum)
 "         Orthopaedic Trauma Service (OTS) Discharge Summary   Patient ID: [name] MRN: [id number] DOB/AGE: 1976/03/16 49 y.o.  Admit date: 06/26/2024 Discharge date: 06/30/2024  Admission Diagnoses: Closed right distal tibial shaft fracture Closed right fibula fracture Chronic pain on chronic opioid treatment Depression Vitamin D  deficiency Anxiety   Discharge Diagnoses:  Principal Problem:   Closed fracture of distal end of right tibia Active Problems:   Chronic pain syndrome   Anxiety   Depression   Vitamin D  deficiency   Fibula fracture   Chronic, continuous use of opioids   Past Medical History:  Diagnosis Date   ADHD    Anemia    Anxiety    Arthralgia    Bimalleolar ankle fracture 07/19/2016   left   Bipolar disorder (HCC)    Chronic pain    hands, knees, back   Chronic, continuous use of opioids 06/30/2024   Depression    Migraines    Obesity    Pre-diabetes      Procedures Performed: 06/27/2024-Dr. Celena LEFT TIBIA INTRAMEDULLARY NAILING with 9 X 375 mm Synthes, statically locked    Discharged Condition: good  Hospital Course:   Patient is a 49 year old female who sustained a fall on some ice on 06/26/2024.  Patient had immediate onset of pain in her left lower leg and ankle.  She was brought to [facility] where she was found to have a fracture to her left distal tibia, previous history of left ankle fracture repair with retained hardware.  Isolated injury was noted and she was admitted to the orthopedic service for treatment.  Patient is on chronic oxycodone  20 mg 5 times a day for chronic pain.  CT scan was obtained preoperatively which did confirm that there was no involvement of her posterior malleolus.  Patient was brought to the operating room on 06/27/2024 for the procedure noted above.  Patient tolerated the procedure well and then transferred to the PACU for recovery of anesthesia and then transferred to the orthopedic floor for  observation, pain control and therapies.  Patient started working with therapies on postoperative day #1 and progressed appropriately.  We were little concerned given her chronic baseline medication needs that her pain would be difficult to control.  We were pleased to see that each postoperative day she was under her reported chronic MME's.  Patient was started on Lovenox  for DVT and PE prophylaxis and she will be discharged on Eliquis  2.5 mg p.o. twice daily for the next 30 days.  She received Ancef  for perioperative antibiotic biotic prophylaxis.  Intraoperatively we did note that her bone was clinically very poor.  Suspect that this is related to her chronic opioid use.  She also does have a history of vitamin D  deficiency.  Vitamin D  levels were obtained this admission as well and are still in the deficient range.  She was started on supplementation and will be continued on this upon discharge  Dressings were changed on 06/30/2024 and wound care was reviewed with the patient and her significant other.  She is touchdown weightbearing on her left leg with the use of a cam boot and walker.  We would likely advance her to weightbearing as tolerated by 4 weeks.  She was encouraged to move her ankle and knee is much as possible.  I am a little concerned that she will self limit her motion and this may be detrimental to her recovery.  Would like to  get her to outpatient therapy after her first postoperative visit.  Patient discharged in stable condition on 06/30/2024  Discharge summary will be routed to her primary care physician as well as her pain management provider   Consults: None  Significant Diagnostic Studies: labs:   Latest Reference Range & Units 06/27/24 16:48 06/28/24 05:56 06/29/24 06:06  Sodium 135 - 145 mmol/L 135    Potassium 3.5 - 5.1 mmol/L 4.4    Chloride 98 - 111 mmol/L 102    CO2 22 - 32 mmol/L 24    Glucose 70 - 99 mg/dL 848 (H)    BUN 6 - 20 mg/dL 13    Creatinine 9.55 - 1.00  mg/dL 9.24    Calcium 8.9 - 89.6 mg/dL 9.0    Anion gap 5 - 15  9    Alkaline Phosphatase 38 - 126 U/L 87    Albumin 3.5 - 5.0 g/dL 3.8    AST 15 - 41 U/L 20    ALT 0 - 44 U/L 15    Total Protein 6.5 - 8.1 g/dL 7.6    Total Bilirubin 0.0 - 1.2 mg/dL 0.3    GFR, Estimated >39 mL/min >60    Vitamin D , 25-Hydroxy 30 - 100 ng/mL  20.8 (L)   WBC 4.0 - 10.5 K/uL 10.0 8.7 7.6  RBC 3.87 - 5.11 MIL/uL 5.97 (H) 4.97 4.75  Hemoglobin 12.0 - 15.0 g/dL 86.9 89.0 (L) 89.4 (L)  HCT 36.0 - 46.0 % 42.9 35.1 (L) 33.8 (L)  MCV 80.0 - 100.0 fL 71.9 (L) 70.6 (L) 71.2 (L)  MCH 26.0 - 34.0 pg 21.8 (L) 21.9 (L) 22.1 (L)  MCHC 30.0 - 36.0 g/dL 69.6 68.8 68.8  RDW 88.4 - 15.5 % 15.2 14.9 14.7  Platelets 150 - 400 K/uL 213 184 191  nRBC 0.0 - 0.2 % 0.0 0.0 0.0  (H): Data is abnormally high (L): Data is abnormally low   Treatments: IV hydration, antibiotics: Ancef , analgesia: acetaminophen , Morphine , and oxycodone , anticoagulation: Lovenox  while inpatient and Eliquis  at discharge, therapies: PT, OT, and RN, and surgery: As above  Discharge Exam:  Orthopaedic Trauma Service Progress Note   Patient ID: [name] MRN: [id number] DOB/AGE: 1975/06/26 49 y.o.   Subjective:   Ortho issues stable   Will discharge home today   Pain medicine usage continues to be under reported chronic MME allowance.  No additional narcotics will be prescribed at discharge   All DME has been delivered to patient's room     ROS As above   Today's  total administered Morphine  Milligram Equivalents: 30 Yesterday's total administered Morphine  Milligram Equivalents: 57   Objective:    VITALS:         Vitals:    06/29/24 1446 06/29/24 2048 06/30/24 0326 06/30/24 0758  BP: 113/62 106/64 109/72 (!) 130/92  Pulse: 88 93 83 94  Resp: 16 16 16 18   Temp: 98.8 F (37.1 C) 98.4 F (36.9 C) 98.6 F (37 C) 98.1 F (36.7 C)  TempSrc:   Oral   Oral  SpO2: 98% 99% 98% 100%  Weight:          Height:               Estimated body mass index is 44.37 kg/m as calculated from the following:   Height as of this encounter: 5' 6 (1.676 m).   Weight as of this encounter: 124.7 kg.     Intake/Output      02/04 0701  02/05 0700 02/05 0701 02/06 0700   P.O. 120 240   Total Intake(mL/kg) 120 (1) 240 (1.9)   Urine (mL/kg/hr) 1100 (0.4)    Total Output 1100    Net -980 +240        Urine Occurrence 1 x 1 x   Stool Occurrence  1 x      LABS   Lab Results Last 24 Hours  No results found for this or any previous visit (from the past 24 hours).       PHYSICAL EXAM:    Gen: resting comfortably, sitting comfortably on edge of the bed Lungs: unlabored Cardiac: reg Ext:       Left Lower Extremity CAM boot in place  Dressing is clean, dry and intact             All dressings removed             All wounds are stable and look excellent             Extremity is warm             Swelling is well-controlled             No DCT             Compartments are soft             No pain out of proportion with passive stretching of toes or ankle             DPN, SPN, TN sensory functions are intact             Ankle flexion, extension, inversion eversion intact             + DP pulse     Assessment/Plan: 3 Days Post-Op    Principal Problem:   Fibula fracture Active Problems:   Closed fracture of distal end of right tibia     Anti-infectives (From admission, onward)        Start     Dose/Rate Route Frequency Ordered Stop    06/27/24 1024   ceFAZolin  (ANCEF ) 3-4 GM/150ML-% IVPB       Note to Pharmacy: Coni Sensor M: cabinet override         06/27/24 1024 06/27/24 1253    06/27/24 0745   ceFAZolin  (ANCEF ) IVPB 3g/150 mL premix        3 g 300 mL/hr over 30 Minutes Intravenous On call to O.R. 06/27/24 9268 06/27/24 1315         .   POD/HD#: 6   49 year old female with low energy fracture to left distal tibial shaft, chronic pain management   - Low energy fracture left tibia s/p  intramedullary nailing of left tibia             Touchdown weightbearing left leg with Cam boot             Unrestricted range of motion of left knee and ankle               Cam boot only needs to be on when mobilizing otherwise it may be off to allow for range of motion exercises               Dressing changed today                         New Mepilex dressings applied to incision sites, Kerlix and Ace wrap applied  from foot to just below her knee                         Can do another dressing change in 48 to 72 hours                         Okay to leave incisions open to the air once there is no drainage and then may begin washing her sites with soap and water only               Ice and elevate for swelling and pain control.  Move toes and ankle to help with swelling control as well.               PT and OT prior to discharge     - Pain management:             Multimodal             She continues to be under her reported MME yesterday from where she is at baseline.   Do not anticipate escalating opioid.  Do not feel that this would be beneficial.  Continue to maximize nonnarcotic modalities               Her last 30-day prescription was filled on 06/08/2024.  Given her medication usage postoperatively she has more than enough medication left on her most recent oxycodone  fill to get her to her next refill               Will also communicate with her chronic pain management provider as well   - ABL anemia/Hemodynamics             Stable   - Medical issues              Chronic pain on chronic opioids                         Home regimen ordered on as needed basis                - DVT/PE prophylaxis:             Lovenox  while inpatient and Eliquis  at discharge   - ID:              Perioperative antibiotics completed   - Metabolic Bone Disease:             Likely multifactorial.  Bone very soft clinically.  Suspect her chronic opioid use is contributing             Supplement  vitamin D  and optimize nutrition   - Activity:             As above   - FEN/GI prophylaxis/Foley/Lines:             Regular diet   - Impediments to fracture healing:             Vitamin D  deficiency             Long-term opioid use for chronic pain   - Dispo:             Discharge home today             Follow-up with orthopedics in 10 to 14 days    Disposition: Discharge disposition: 01-Home or Self Care       Discharge  Instructions     Call MD / Call 911   Complete by: As directed    If you experience chest pain or shortness of breath, CALL 911 and be transported to the hospital emergency room.  If you develope a fever above 101 F, pus (white drainage) or increased drainage or redness at the wound, or calf pain, call your surgeon's office.   Constipation Prevention   Complete by: As directed    Drink plenty of fluids.  Prune juice may be helpful.  You may use a stool softener, such as Colace (over the counter) 100 mg twice a day.  Use MiraLax  (over the counter) for constipation as needed.   Diet general   Complete by: As directed    Discharge instructions   Complete by: As directed    Orthopaedic Trauma Service Discharge Instructions   General Discharge Instructions  Orthopaedic Injuries:  Left tibia fracture treated with intramedullary nailing  WEIGHT BEARING STATUS: Touchdown weightbearing left leg with Cam boot and crutches/walker  RANGE OF MOTION/ACTIVITY: Unrestricted range of motion of left knee and ankle.  Okay to come out of your cam boot is much as possible to work on motion.  Cam boot only needs to be on when ambulating  Bone health: Labs show vitamin D  deficiency.  Please take vitamin D  that is been prescribed for you  Review the following resource for additional information regarding bone health  [email or web address]  Wound Care: Wound care starting on 07/03/2024.  Please see instructions below Discharge Wound Care  Instructions  Do NOT apply any ointments, solutions or lotions to pin sites or surgical wounds.  These prevent needed drainage and even though solutions like hydrogen peroxide kill bacteria, they also damage cells lining the pin sites that help fight infection.  Applying lotions or ointments can keep the wounds moist and can cause them to breakdown and open up as well. This can increase the risk for infection. When in doubt call the office.  Surgical incisions should be dressed daily.  If any drainage is noted, use one layer of adaptic or Mepitel, then gauze, Kerlix, and an ace wrap.  Instead of gauze you can use silicone foam dressing to go directly on the surgical sites.  Once you can tolerate would use a compression sock instead of an Ace wrap for swelling control  [email or web address]  [email or web address]  These dressing supplies should be available at local medical supply stores ([employer], [facility], etc). They are not usually carried at places like [facility], [facility], [employer], etc  Once the incision is completely dry and without drainage, it may be left open to air out.  Showering may begin 36-48 hours later.  Cleaning gently with soap and water. .  DVT/PE prophylaxis: Eliquis  2.5 mg every 12 hours for 30 days for blood clot prevention  Diet: as you were eating previously.  Can use over the counter stool softeners and bowel preparations, such as Miralax , to help with bowel movements.  Narcotics can be constipating.  Be sure to drink plenty of fluids  PAIN MEDICATION USE AND EXPECTATIONS  You have likely been given narcotic medications to help control your pain.  After a traumatic event that results in an fracture (broken bone) with or without  surgery, it is ok to use narcotic pain medications to help control one's pain.  We understand that everyone responds to pain differently and each individual patient will be evaluated on a regular basis for the continued need for narcotic medications. Ideally,  narcotic medication use should last no more than 6-8 weeks (coinciding with fracture healing).   As a patient it is your responsibility as well to monitor narcotic medication use and report the amount and frequency you use these medications when you come to your office visit.   We would also advise that if you are using narcotic medications, you should take a dose prior to therapy to maximize you participation.  IF YOU ARE ON NARCOTIC MEDICATIONS IT IS NOT PERMISSIBLE TO OPERATE A MOTOR VEHICLE (MOTORCYCLE/CAR/TRUCK/MOPED) OR HEAVY MACHINERY DO NOT MIX NARCOTICS WITH OTHER CNS (CENTRAL NERVOUS SYSTEM) DEPRESSANTS SUCH AS ALCOHOL    POST-OPERATIVE OPIOID TAPER INSTRUCTIONS:  It is important to wean off of your opioid medication as soon as possible. If you do not need pain medication after your surgery it is ok to stop day one.  Opioids include:  o Codeine , Hydrocodone (Norco, Vicodin), Oxycodone (Percocet, oxycontin ) and hydromorphone  amongst others.   Long term and even short term use of opiods can cause:  o Increased pain response  o Dependence  o Constipation  o Depression  o Respiratory depression  o And more.   Withdrawal symptoms can include  o Flu like symptoms  o Nausea, vomiting  o And more  Techniques to manage these symptoms  o Hydrate well  o Eat regular healthy meals  o Stay active  o Use relaxation techniques(deep breathing, meditating, yoga)  Do Not substitute Alcohol  to help with tapering  If you have been on opioids for less than two weeks and do not have pain than it is ok to stop all together.   Plan to wean off of opioids  o This plan should start within one week post op of your fracture surgery    o Maintain the same interval or time between taking each dose and first decrease the dose.   o Cut the total daily intake of opioids by one tablet each day  o Next start to increase the time between doses.  o The last dose that should be eliminated is the evening dose.    STOP SMOKING OR USING NICOTINE PRODUCTS!!!!  As discussed nicotine severely impairs your body's ability to heal surgical and traumatic wounds but also impairs bone healing.  Wounds and bone heal by forming microscopic blood vessels (angiogenesis) and nicotine is a vasoconstrictor (essentially, shrinks blood vessels).  Therefore, if vasoconstriction occurs to these microscopic blood vessels they essentially disappear and are unable to deliver necessary nutrients to the healing tissue.  This is one modifiable factor that you can do to dramatically increase your chances of healing your injury.    (This means no smoking, no nicotine gum, patches, etc)  DO NOT USE NONSTEROIDAL ANTI-INFLAMMATORY DRUGS (NSAID'S)  Using products such as Advil  (ibuprofen ), Aleve  (naproxen ), Motrin  (ibuprofen ) for additional pain control during fracture healing can delay and/or prevent the healing response.  If you would like to take over the counter (OTC) medication, Tylenol  (acetaminophen ) is ok.  However, some narcotic medications that are given for pain control contain acetaminophen  as well. Therefore, you should not exceed more than 4000 mg of tylenol  in a day if you do not have liver disease.  Also note that there are may OTC medicines, such as cold medicines and allergy medicines that my contain tylenol  as well.  If you have any questions about medications and/or interactions please ask your doctor/PA or your pharmacist.      ICE AND ELEVATE INJURED/OPERATIVE EXTREMITY  Using ice and elevating the injured extremity  above your heart can help with swelling and pain control.  Icing in a pulsatile fashion, such as 20 minutes on and 20 minutes off, can be  followed.    Do not place ice directly on skin. Make sure there is a barrier between to skin and the ice pack.    Using frozen items such as frozen peas works well as the conform nicely to the are that needs to be iced.  USE AN ACE WRAP OR TED HOSE FOR SWELLING CONTROL  In addition to icing and elevation, Ace wraps or TED hose are used to help limit and resolve swelling.  It is recommended to use Ace wraps or TED hose until you are informed to stop.    When using Ace Wraps start the wrapping distally (farthest away from the body) and wrap proximally (closer to the body)   Example: If you had surgery on your leg and you do not have a splint on, start the ace wrap at the toes and work your way up to the thigh        If you had surgery on your upper extremity and do not have a splint on, start the ace wrap at your fingers and work your way up to the upper arm  IF YOU ARE IN A SPLINT OR CAST DO NOT REMOVE IT FOR ANY REASON   If your splint gets wet for any reason please contact the office immediately. You may shower in your splint or cast as long as you keep it dry.  This can be done by wrapping in a cast cover or garbage back (or similar)  Do Not stick any thing down your splint or cast such as pencils, money, or hangers to try and scratch yourself with.  If you feel itchy take benadryl  as prescribed on the bottle for itching  IF YOU ARE IN A CAM BOOT (BLACK BOOT)  You may remove boot periodically. Perform daily dressing changes as noted below.  Wash the liner of the boot regularly and wear a sock when wearing the boot. It is recommended that you sleep in the boot until told otherwise    Call office for the following: ? Temperature greater than 101F ? Persistent nausea and vomiting ? Severe uncontrolled pain ? Redness, tenderness, or signs of infection (pain, swelling, redness, odor or green/yellow discharge around the site) ? Difficulty breathing, headache or visual  disturbances ? Hives ? Persistent dizziness or light-headedness ? Extreme fatigue ? Any other questions or concerns you may have after discharge  In an emergency, call 911 or go to an Emergency Department at a nearby hospital  HELPFUL INFORMATION  ? If you had a block, it will wear off between 8-24 hrs postop typically.  This is period when your pain may go from nearly zero to the pain you would have had postop without the block.  This is an abrupt transition but nothing dangerous is happening.  You may take an extra dose of narcotic when this happens.  ? You should wean off your narcotic medicines as soon as you are able.  Most patients will be off or using minimal narcotics before their first postop appointment.   ? We suggest you use the pain medication the first night prior to going to bed, in order to ease any pain when the anesthesia wears off. You should avoid taking pain medications on an empty stomach as it will make you nauseous.  ? Do not drink alcoholic beverages or  take illicit drugs when taking pain medications.  ? In most states it is against the law to drive while you are in a splint or sling.  And certainly against the law to drive while taking narcotics.  ? You may return to work/school in the next couple of days when you feel up to it.   ? Pain medication may make you constipated.  Below are a few solutions to try in this order:   ? Decrease the amount of pain medication if you aren't having pain.   ? Drink lots of decaffeinated fluids.   ? Drink prune juice and/or each dried prunes   o If the first 3 don't work start with additional solutions   ? Take Colace - an over-the-counter stool softener   ? Take Senokot - an over-the-counter laxative   ? Take Miralax  - a stronger over-the-counter laxative     CALL THE OFFICE WITH ANY QUESTIONS OR CONCERNS: [phone]   VISIT OUR WEBSITE FOR ADDITIONAL INFORMATION: [email or web address]   Driving restrictions   Complete  by: As directed    No driving until further notice   Increase activity slowly as tolerated   Complete by: As directed    Post-operative opioid taper instructions:   Complete by: As directed    POST-OPERATIVE OPIOID TAPER INSTRUCTIONS: It is important to wean off of your opioid medication as soon as possible. If you do not need pain medication after your surgery it is ok to stop day one. Opioids include: Codeine , Hydrocodone (Norco, Vicodin), Oxycodone (Percocet, oxycontin ) and hydromorphone  amongst others.  Long term and even short term use of opiods can cause: Increased pain response Dependence Constipation Depression Respiratory depression And more.  Withdrawal symptoms can include Flu like symptoms Nausea, vomiting And more Techniques to manage these symptoms Hydrate well Eat regular healthy meals Stay active Use relaxation techniques(deep breathing, meditating, yoga) Do Not substitute Alcohol  to help with tapering If you have been on opioids for less than two weeks and do not have pain than it is ok to stop all together.  Plan to wean off of opioids This plan should start within one week post op of your joint replacement. Maintain the same interval or time between taking each dose and first decrease the dose.  Cut the total daily intake of opioids by one tablet each day Next start to increase the time between doses. The last dose that should be eliminated is the evening dose.      Touch down weight bearing   Complete by: As directed    Touchdown weightbearing left leg with Cam boot   Laterality: left   Extremity: Lower      Allergies as of 06/30/2024       Reactions   Dilaudid  [hydromorphone ] Nausea And Vomiting   Buspar [buspirone] Other (See Comments)   MADE ME REAL JITTERY        Medication List     STOP taking these medications    tiZANidine  4 MG tablet Commonly known as: ZANAFLEX        TAKE these medications    acetaminophen  500 MG  tablet Commonly known as: TYLENOL  Take 2 tablets (1,000 mg total) by mouth every 8 (eight) hours as needed for mild pain (pain score 1-3).   amphetamine-dextroamphetamine 30 MG tablet Commonly known as: ADDERALL Take 1 tablet by mouth 2 (two) times daily as needed (ADHD).   apixaban  2.5 MG Tabs tablet Commonly known as: Eliquis  Take 1 tablet (2.5 mg total) by mouth 2 (two)  times daily.   ascorbic acid  1000 MG tablet Commonly known as: VITAMIN C  Take 1 tablet (1,000 mg total) by mouth daily.   docusate sodium  100 MG capsule Commonly known as: Colace Take 1 capsule (100 mg total) by mouth 2 (two) times daily for 14 days.   Ferralet 90 90-1 MG Tabs Take 1 tablet by mouth daily.   methocarbamol  500 MG tablet Commonly known as: ROBAXIN  Take 1-2 tablets (500-1,000 mg total) by mouth every 8 (eight) hours as needed for muscle spasms.   Mounjaro 2.5 MG/0.5ML Pen Generic drug: tirzepatide Inject 2.5 mg into the skin once a week.   naloxone  4 MG/0.1ML Liqd nasal spray kit Commonly known as: NARCAN  1 spray in either nostril at signs of opioid overdose.  May repeat in opposite nostril with new spray in 2-3 minutes if no or minimal response such as no improvement in breathing or responsiveness   Oxycodone  HCl 20 MG Tabs Take 20 mg by mouth 5 (five) times daily.   Vitamin D3 125 MCG (5000 UT) Tabs Take by mouth daily.               Durable Medical Equipment  (From admission, onward)           Start     Ordered   06/29/24 1002  For home use only DME standard manual wheelchair with seat cushion  Once       Comments: Patient suffers from left tibial shift fracture  which impairs their ability to perform daily activities like bathing in the home.  A walker will not resolve issue with performing activities of daily living. A wheelchair will allow patient to safely perform daily activities. Patient can safely propel the wheelchair in the home or has a caregiver who can provide  assistance. Length of need 6 months . Accessories: elevating leg rests (ELRs), wheel locks, extensions and anti-tippers.   06/29/24 1002   06/29/24 1001  For home use only DME Walker rolling  Once       Comments: bariatric  Question Answer Comment  Walker: With 5 Inch Wheels   Patient needs a walker to treat with the following condition Fx      06/29/24 1002   06/29/24 1001  For home use only DME Bedside commode  Once       Comments: bariatric  Question:  Patient needs a bedside commode to treat with the following condition  Answer:  Fx   06/29/24 1002              Discharge Care Instructions  (From admission, onward)           Start     Ordered   06/30/24 0000  Touch down weight bearing       Comments: Touchdown weightbearing left leg with Cam boot  Question Answer Comment  Laterality left   Extremity Lower      06/30/24 1336            Follow-up Information     Shelda Atlas, MD Follow up.   Specialty: Internal Medicine Contact information: [address] [place] [address] [phone]         Celena Sharper, MD. Schedule an appointment as soon as possible for a visit in 2 week(s).   Specialty: Orthopedic Surgery Why: follow up for left tibia fracture Contact information: [address] [place] [address] [phone]  Signed:  Francis [name], PA-C [phone] (C) 06/30/2024, 1:41 PM  [facility] [address] [place] [address] [phone] (F)    "

## 2024-06-30 NOTE — Progress Notes (Addendum)
 Reviewed AVS, patient expressed understanding of medications, MD follow up reviewed.   Removed IV, Site clean, dry and intact.  See LDA for information on wounds at discharge. Patient states all belongings brought to the hospital at time of admission are accounted for and packed to take home.  Picked up medications from [facility] pharmacy. Nursing Staff contacted to transport patient to entrance A, where family member was waiting in vehicle to transport home.

## 2024-06-30 NOTE — TOC Transition Note (Signed)
 Transition of Care [facility]) - Discharge Note   Patient Details  Name: [name] MRN: [id number] Date of Birth: 07/19/1975  Transition of [facility]) CM/SW Contact:  Rosalva Jon Bloch, RN Phone Number: 06/30/2024, 3:23 PM   Clinical Narrative:    Patient will DC to: Home Anticipated DC date: 06/30/2024 Family notified: yes Transport by: car     - S/p L TIBIA IMN, 2/2  Per MD patient ready for DC today. RN, patient, and patient's husband notified of DC. Pt agreeable to home health and DME needs. Pt without provider preference. [facility] accepted for home health PT services. Home address: [address] [address]. DME: RW, W/C and BSC, referral made with [employer] and equipment delivered to bedside.  Pt without RX med concerns. Pt without transportation issues. Post hospital f/u noted on AVS.   RNCM will sign off for now as intervention is no longer needed. Please consult us  again if new needs arise.   Final next level of care: Home w Home Health Services Barriers to Discharge: No Barriers Identified   Patient Goals and CMS Choice     Choice offered to / list presented to : Patient      Discharge Placement                       Discharge Plan and Services Additional resources added to the After Visit Summary for                  DME Arranged: Walker rolling, Bedside commode, Wheelchair manual DME Agency: [employer] Date DME Agency Contacted: 06/29/24   Representative spoke with at DME Agency: [facility] Arranged: PT HH Agency: [facility] Date HH Agency Contacted: 06/29/24 Time HH Agency Contacted: 1523 Representative spoke with at [facility] Agency: [facility]  Social Drivers of Health (SDOH) Interventions SDOH Screenings   Food Insecurity: No Food Insecurity (06/28/2024)  Housing: Unknown (06/28/2024)  Transportation Needs: No Transportation Needs (06/28/2024)  Utilities: Not At Risk (06/28/2024)  Depression (PHQ2-9): Low Risk  (05/08/2022)  Tobacco Use: Low Risk (06/27/2024)     Readmission Risk Interventions     No data to display

## 2024-06-30 NOTE — Progress Notes (Signed)
 Physical Therapy Treatment Patient Details Name: [name] MRN: [id number] DOB: May 04, 1976 Today's Date: 06/30/2024   History of Present Illness Pt is a 49 y.o. F who presents 06/26/2024 after slipping with left tibial shift fracture now s/p intramedullary nailing 2/2. Significant PMH: ADHD, bipolar disorder, chronic pain, depression, obesity.    PT Comments  Pt alert and agreeable to participate in physical therapy session. Reviewed w/c parts and management with pt and pt spouse. Pt demonstrating ability to perform pivotal transfer with RW with good adherence to weightbearing precautions. Pt propelled w/c with BUE's x 100 ft with supervision. Plan for d/c home today.    If plan is discharge home, recommend the following: A little help with walking and/or transfers;A little help with bathing/dressing/bathroom;Assistance with cooking/housework;Help with stairs or ramp for entrance;Assist for transportation   Can travel by private vehicle        Equipment Recommendations  Rolling walker (2 wheels);Wheelchair (measurements PT);BSC/3in1 (bariatric equipment)    Recommendations for Other Services       Precautions / Restrictions Precautions Precautions: Fall Recall of Precautions/Restrictions: Impaired Required Braces or Orthoses: Other Brace Other Brace: CAM boot on when mobilizing Restrictions Weight Bearing Restrictions Per Provider Order: Yes LLE Weight Bearing Per Provider Order: Touchdown weight bearing     Mobility  Bed Mobility Overal bed mobility: Needs Assistance Bed Mobility: Supine to Sit, Sit to Supine     Supine to sit: Min assist Sit to supine: Min assist   General bed mobility comments: MinA for LLE management out of bed, use of pillow to slide across    Transfers Overall transfer level: Needs assistance Equipment used: Rolling walker (2 wheels) Transfers: Sit to/from Stand, Bed to chair/wheelchair/BSC Sit to Stand: Supervision Stand pivot transfers:  Supervision              Ambulation/Gait Ambulation/Gait assistance: Supervision Gait Distance (Feet): 3 Feet Assistive device: Rolling walker (2 wheels) Gait Pattern/deviations: Step-to pattern, Antalgic Gait velocity: decreased     General Gait Details: Good offloading from LLE and use of upper extremities on RW. Slow and effortful   Psychologist, Counselling mobility: Yes Wheelchair propulsion: Both upper extremities Wheelchair parts: Supervision/cueing Distance: 100   Tilt Bed    Modified Rankin (Stroke Patients Only)       Balance Overall balance assessment: Needs assistance Sitting-balance support: Feet supported Sitting balance-Leahy Scale: Good     Standing balance support: Reliant on assistive device for balance Standing balance-Leahy Scale: Fair                              Hotel Manager: No apparent difficulties  Cognition Arousal: Alert Behavior During Therapy: WFL for tasks assessed/performed   PT - Cognitive impairments: No apparent impairments                         Following commands: Intact      Cueing Cueing Techniques: Verbal cues  Exercises      General Comments        Pertinent Vitals/Pain Pain Assessment Pain Assessment: Faces Faces Pain Scale: Hurts little more Pain Location: L leg Pain Descriptors / Indicators: Tender, Grimacing, Throbbing Pain Intervention(s): Limited activity within patient's tolerance, Monitored during session    Home Living  Prior Function            PT Goals (current goals can now be found in the care plan section) Acute Rehab PT Goals Patient Stated Goal: did not state PT Goal Formulation: With patient/family Time For Goal Achievement: 07/12/24 Potential to Achieve Goals: Good Progress towards PT goals: Progressing toward goals    Frequency    Min  2X/week      PT Plan      Co-evaluation              AM-PAC PT 6 Clicks Mobility   Outcome Measure  Help needed turning from your back to your side while in a flat bed without using bedrails?: A Little Help needed moving from lying on your back to sitting on the side of a flat bed without using bedrails?: A Little Help needed moving to and from a bed to a chair (including a wheelchair)?: A Little Help needed standing up from a chair using your arms (e.g., wheelchair or bedside chair)?: A Little Help needed to walk in hospital room?: A Little Help needed climbing 3-5 steps with a railing? : Total 6 Click Score: 16    End of Session Equipment Utilized During Treatment: Gait belt;Other (comment) (CAM boot) Activity Tolerance: Patient tolerated treatment well Patient left: in bed;with call bell/phone within reach;with family/visitor present Nurse Communication: Mobility status PT Visit Diagnosis: Pain;Difficulty in walking, not elsewhere classified (R26.2) Pain - Right/Left: Left Pain - part of body: Leg     Time: 1420-1505 PT Time Calculation (min) (ACUTE ONLY): 45 min  Charges:    $Therapeutic Activity: 38-52 mins PT General Charges $$ ACUTE PT VISIT: 1 Visit                     Aleck Daring, PT, DPT Acute Rehabilitation Services Office [phone]    Aleck ONEIDA Daring 06/30/2024, 4:11 PM

## 2024-06-30 NOTE — Progress Notes (Cosign Needed)
 "                                                                         [facility] Progress Note  Patient ID: [name] MRN: [id number] DOB/AGE: Dec 03, 1975 49 y.o.  Subjective:  Ortho issues stable  Will discharge home today  Pain medicine usage continues to be under reported chronic MME allowance.  No additional narcotics will be prescribed at discharge  All DME has been delivered to patient's room   ROS As above  Today's  total administered Morphine  Milligram Equivalents: 30 Yesterday's total administered Morphine  Milligram Equivalents: 57  Objective:   VITALS:   Vitals:   06/29/24 1446 06/29/24 2048 06/30/24 0326 06/30/24 0758  BP: 113/62 106/64 109/72 (!) 130/92  Pulse: 88 93 83 94  Resp: 16 16 16 18   Temp: 98.8 F (37.1 C) 98.4 F (36.9 C) 98.6 F (37 C) 98.1 F (36.7 C)  TempSrc:  Oral  Oral  SpO2: 98% 99% 98% 100%  Weight:      Height:        Estimated body mass index is 44.37 kg/m as calculated from the following:   Height as of this encounter: 5' 6 (1.676 m).   Weight as of this encounter: 124.7 kg.   Intake/Output      02/04 0701 02/05 0700 02/05 0701 02/06 0700   P.O. 120 240   Total Intake(mL/kg) 120 (1) 240 (1.9)   Urine (mL/kg/hr) 1100 (0.4)    Total Output 1100    Net -980 +240        Urine Occurrence 1 x 1 x   Stool Occurrence  1 x     LABS  No results found for this or any previous visit (from the past 24 hours).   PHYSICAL EXAM:   Gen: resting comfortably, sitting comfortably on edge of the bed Lungs: unlabored Cardiac: reg Ext:       Left Lower Extremity CAM boot in place  Dressing is clean, dry and intact  All dressings removed  All wounds are stable and look excellent             Extremity is warm  Swelling is well-controlled             No DCT             Compartments are soft             No pain out of proportion with passive stretching of toes or ankle             DPN, SPN, TN sensory  functions are intact             Ankle flexion, extension, inversion eversion intact             + DP pulse    Assessment/Plan: 3 Days Post-Op   Principal Problem:   Fibula fracture Active Problems:   Closed fracture of distal end of right tibia   Anti-infectives (From admission, onward)    Start     Dose/Rate Route Frequency Ordered Stop   06/27/24 1024  ceFAZolin  (ANCEF ) 3-4 GM/150ML-% IVPB       Note to Pharmacy: Coni Daved HERO: cabinet  override      06/27/24 1024 06/27/24 1253   06/27/24 0745  ceFAZolin  (ANCEF ) IVPB 3g/150 mL premix        3 g 300 mL/hr over 30 Minutes Intravenous On call to O.R. 06/27/24 9268 06/27/24 1315     .  POD/HD#: 26  49 year old female with low energy fracture to left distal tibial shaft, chronic pain management   - Low energy fracture left tibia s/p intramedullary nailing of left tibia             Touchdown weightbearing left leg with Cam boot             Unrestricted range of motion of left knee and ankle              Cam boot only needs to be on when mobilizing otherwise it may be off to allow for range of motion exercises               Dressing changed today   New Mepilex dressings applied to incision sites, Kerlix and Ace wrap applied from foot to just below her knee   Can do another dressing change in 48 to 72 hours   Okay to leave incisions open to the air once there is no drainage and then may begin washing her sites with soap and water only               Ice and elevate for swelling and pain control.  Move toes and ankle to help with swelling control as well.               PT and OT prior to discharge     - Pain management:             Multimodal             She continues to be under her reported MME yesterday from where she is at baseline.   Do not anticipate escalating opioid.  Do not feel that this would be beneficial.  Continue to maximize nonnarcotic modalities   Her last 30-day prescription was filled on 06/08/2024.   Given her medication usage postoperatively she has more than enough medication left on her most recent oxycodone  fill to get her to her next refill   Will also communicate with her chronic pain management provider as well   - ABL anemia/Hemodynamics             Stable   - Medical issues              Chronic pain on chronic opioids                         Home regimen ordered on as needed basis                - DVT/PE prophylaxis:             Lovenox  while inpatient and Eliquis  at discharge   - ID:              Perioperative antibiotics completed   - Metabolic Bone Disease:             Likely multifactorial.  Bone very soft clinically.  Suspect her chronic opioid use is contributing             Supplement vitamin D  and optimize nutrition   - Activity:  As above   - FEN/GI prophylaxis/Foley/Lines:             Regular diet   - Impediments to fracture healing:             Vitamin D  deficiency             Long-term opioid use for chronic pain   - Dispo:             Discharge home today  Follow-up with orthopedics in 10 to 14 days   Francis [name], PA-C [phone] (C) 06/30/2024, 1:17 PM  [facility] [address] [place] [address] [phone] (F)    After 5pm and on the weekends please log on to [employer], go to orthopaedics and the look under the [facility] Call for the provider(s) on call. You can also call our office at [phone] and then follow the prompts to be connected to the call team.  Patient ID: [name], female   DOB: Mar 31, 1976, 49 y.o.   MRN: [id number]  "
